# Patient Record
Sex: Male | Born: 1945 | Race: White | Hispanic: No | Marital: Married | Smoking: Former smoker
Health system: Southern US, Community
[De-identification: ages and names within clinical notes are randomized; demographics above are authoritative.]

## PROBLEM LIST (undated history)

## (undated) DIAGNOSIS — Z8719 Personal history of other diseases of the digestive system: Secondary | ICD-10-CM

## (undated) DIAGNOSIS — R233 Spontaneous ecchymoses: Secondary | ICD-10-CM

## (undated) DIAGNOSIS — Z8711 Personal history of peptic ulcer disease: Secondary | ICD-10-CM

## (undated) DIAGNOSIS — R112 Nausea with vomiting, unspecified: Secondary | ICD-10-CM

## (undated) DIAGNOSIS — R51 Headache: Secondary | ICD-10-CM

## (undated) DIAGNOSIS — I219 Acute myocardial infarction, unspecified: Secondary | ICD-10-CM

## (undated) DIAGNOSIS — R1310 Dysphagia, unspecified: Secondary | ICD-10-CM

## (undated) DIAGNOSIS — T8859XA Other complications of anesthesia, initial encounter: Secondary | ICD-10-CM

## (undated) DIAGNOSIS — R42 Dizziness and giddiness: Secondary | ICD-10-CM

## (undated) DIAGNOSIS — R238 Other skin changes: Secondary | ICD-10-CM

## (undated) DIAGNOSIS — T4145XA Adverse effect of unspecified anesthetic, initial encounter: Secondary | ICD-10-CM

## (undated) DIAGNOSIS — E039 Hypothyroidism, unspecified: Secondary | ICD-10-CM

## (undated) DIAGNOSIS — H9193 Unspecified hearing loss, bilateral: Secondary | ICD-10-CM

## (undated) DIAGNOSIS — Z9889 Other specified postprocedural states: Secondary | ICD-10-CM

## (undated) DIAGNOSIS — K219 Gastro-esophageal reflux disease without esophagitis: Secondary | ICD-10-CM

## (undated) DIAGNOSIS — R519 Headache, unspecified: Secondary | ICD-10-CM

## (undated) DIAGNOSIS — I639 Cerebral infarction, unspecified: Secondary | ICD-10-CM

## (undated) DIAGNOSIS — E119 Type 2 diabetes mellitus without complications: Secondary | ICD-10-CM

## (undated) DIAGNOSIS — E785 Hyperlipidemia, unspecified: Secondary | ICD-10-CM

## (undated) DIAGNOSIS — I2510 Atherosclerotic heart disease of native coronary artery without angina pectoris: Secondary | ICD-10-CM

## (undated) DIAGNOSIS — M1990 Unspecified osteoarthritis, unspecified site: Secondary | ICD-10-CM

## (undated) HISTORY — DX: Hyperlipidemia, unspecified: E78.5

## (undated) HISTORY — DX: Spontaneous ecchymoses: R23.3

## (undated) HISTORY — DX: Gastro-esophageal reflux disease without esophagitis: K21.9

## (undated) HISTORY — DX: Dysphagia, unspecified: R13.10

## (undated) HISTORY — DX: Atherosclerotic heart disease of native coronary artery without angina pectoris: I25.10

## (undated) HISTORY — DX: Dizziness and giddiness: R42

## (undated) HISTORY — DX: Unspecified osteoarthritis, unspecified site: M19.90

## (undated) HISTORY — DX: Other skin changes: R23.8

## (undated) HISTORY — PX: CORONARY ANGIOPLASTY WITH STENT PLACEMENT: SHX49

---

## 1996-03-29 HISTORY — PX: LAPAROSCOPIC CHOLECYSTECTOMY: SUR755

## 1998-03-29 HISTORY — PX: CARDIAC CATHETERIZATION: SHX172

## 1998-09-23 ENCOUNTER — Encounter: Payer: Self-pay | Admitting: Emergency Medicine

## 1998-09-23 ENCOUNTER — Inpatient Hospital Stay (HOSPITAL_COMMUNITY): Admission: EM | Admit: 1998-09-23 | Discharge: 1998-09-25 | Payer: Self-pay | Admitting: *Deleted

## 2002-03-29 HISTORY — PX: ANTERIOR CERVICAL DECOMP/DISCECTOMY FUSION: SHX1161

## 2002-07-17 ENCOUNTER — Encounter: Payer: Self-pay | Admitting: Neurosurgery

## 2002-07-19 ENCOUNTER — Inpatient Hospital Stay (HOSPITAL_COMMUNITY): Admission: RE | Admit: 2002-07-19 | Discharge: 2002-07-20 | Payer: Self-pay | Admitting: Neurosurgery

## 2002-07-19 ENCOUNTER — Encounter: Payer: Self-pay | Admitting: Neurosurgery

## 2003-01-08 ENCOUNTER — Emergency Department (HOSPITAL_COMMUNITY): Admission: EM | Admit: 2003-01-08 | Discharge: 2003-01-08 | Payer: Self-pay | Admitting: Emergency Medicine

## 2003-01-08 ENCOUNTER — Encounter: Payer: Self-pay | Admitting: Emergency Medicine

## 2003-01-14 ENCOUNTER — Encounter: Admission: RE | Admit: 2003-01-14 | Discharge: 2003-04-14 | Payer: Self-pay | Admitting: Family Medicine

## 2003-10-08 ENCOUNTER — Encounter: Payer: Self-pay | Admitting: Internal Medicine

## 2004-05-18 ENCOUNTER — Ambulatory Visit: Payer: Self-pay | Admitting: Internal Medicine

## 2004-08-25 ENCOUNTER — Ambulatory Visit: Payer: Self-pay | Admitting: Internal Medicine

## 2004-09-01 ENCOUNTER — Ambulatory Visit: Payer: Self-pay | Admitting: Internal Medicine

## 2005-05-25 ENCOUNTER — Ambulatory Visit: Payer: Self-pay | Admitting: Internal Medicine

## 2005-06-07 ENCOUNTER — Ambulatory Visit: Payer: Self-pay | Admitting: Gastroenterology

## 2005-06-17 ENCOUNTER — Ambulatory Visit: Payer: Self-pay | Admitting: Gastroenterology

## 2005-06-17 ENCOUNTER — Encounter (INDEPENDENT_AMBULATORY_CARE_PROVIDER_SITE_OTHER): Payer: Self-pay | Admitting: Specialist

## 2005-06-17 ENCOUNTER — Encounter: Payer: Self-pay | Admitting: Internal Medicine

## 2005-11-24 ENCOUNTER — Ambulatory Visit: Payer: Self-pay | Admitting: Internal Medicine

## 2005-12-13 ENCOUNTER — Encounter: Payer: Self-pay | Admitting: Internal Medicine

## 2005-12-13 ENCOUNTER — Ambulatory Visit: Payer: Self-pay

## 2005-12-15 ENCOUNTER — Ambulatory Visit: Payer: Self-pay | Admitting: Internal Medicine

## 2006-03-16 ENCOUNTER — Ambulatory Visit: Payer: Self-pay | Admitting: Internal Medicine

## 2006-05-13 ENCOUNTER — Emergency Department (HOSPITAL_COMMUNITY): Admission: EM | Admit: 2006-05-13 | Discharge: 2006-05-13 | Payer: Self-pay | Admitting: Emergency Medicine

## 2006-05-19 ENCOUNTER — Ambulatory Visit: Payer: Self-pay | Admitting: Internal Medicine

## 2006-05-19 ENCOUNTER — Encounter: Payer: Self-pay | Admitting: Internal Medicine

## 2006-05-19 LAB — CONVERTED CEMR LAB: Hgb A1c MFr Bld: 8.2 % — ABNORMAL HIGH (ref 4.6–6.0)

## 2006-09-19 ENCOUNTER — Ambulatory Visit: Payer: Self-pay | Admitting: Internal Medicine

## 2006-09-26 DIAGNOSIS — E785 Hyperlipidemia, unspecified: Secondary | ICD-10-CM | POA: Insufficient documentation

## 2006-09-26 DIAGNOSIS — R42 Dizziness and giddiness: Secondary | ICD-10-CM | POA: Insufficient documentation

## 2006-09-26 DIAGNOSIS — G609 Hereditary and idiopathic neuropathy, unspecified: Secondary | ICD-10-CM | POA: Insufficient documentation

## 2006-09-26 DIAGNOSIS — K219 Gastro-esophageal reflux disease without esophagitis: Secondary | ICD-10-CM | POA: Insufficient documentation

## 2006-09-26 DIAGNOSIS — E119 Type 2 diabetes mellitus without complications: Secondary | ICD-10-CM | POA: Insufficient documentation

## 2006-09-26 DIAGNOSIS — I2510 Atherosclerotic heart disease of native coronary artery without angina pectoris: Secondary | ICD-10-CM | POA: Insufficient documentation

## 2006-11-29 ENCOUNTER — Telehealth: Payer: Self-pay | Admitting: Internal Medicine

## 2006-12-01 ENCOUNTER — Telehealth: Payer: Self-pay | Admitting: Internal Medicine

## 2006-12-08 ENCOUNTER — Telehealth: Payer: Self-pay | Admitting: Internal Medicine

## 2006-12-12 ENCOUNTER — Ambulatory Visit: Payer: Self-pay | Admitting: Internal Medicine

## 2006-12-12 LAB — CONVERTED CEMR LAB
BUN: 14 mg/dL (ref 6–23)
CO2: 26 meq/L (ref 19–32)
Calcium: 9 mg/dL (ref 8.4–10.5)
Chloride: 111 meq/L (ref 96–112)
Cholesterol: 192 mg/dL (ref 0–200)
Creatinine, Ser: 1 mg/dL (ref 0.4–1.5)
Creatinine,U: 246.2 mg/dL
GFR calc Af Amer: 98 mL/min
GFR calc non Af Amer: 81 mL/min
Glucose, Bld: 158 mg/dL — ABNORMAL HIGH (ref 70–99)
HDL: 23.2 mg/dL — ABNORMAL LOW (ref >39.0)
Hgb A1c MFr Bld: 7.3 % — ABNORMAL HIGH (ref 4.6–6.0)
LDL Cholesterol: 130 mg/dL — ABNORMAL HIGH (ref 0–99)
Microalb Creat Ratio: 40.2 mg/g — ABNORMAL HIGH (ref 0.0–30.0)
Microalb, Ur: 9.9 mg/dL — ABNORMAL HIGH (ref 0.0–1.9)
Potassium: 4.3 meq/L (ref 3.5–5.1)
Sodium: 141 meq/L (ref 135–145)
Total CHOL/HDL Ratio: 8.3
Triglycerides: 195 mg/dL — ABNORMAL HIGH (ref 0–149)
VLDL: 39 mg/dL (ref 0–40)

## 2006-12-26 ENCOUNTER — Telehealth: Payer: Self-pay | Admitting: Internal Medicine

## 2006-12-26 ENCOUNTER — Ambulatory Visit: Payer: Self-pay | Admitting: Internal Medicine

## 2007-01-10 ENCOUNTER — Ambulatory Visit: Payer: Self-pay | Admitting: Internal Medicine

## 2007-01-10 DIAGNOSIS — J069 Acute upper respiratory infection, unspecified: Secondary | ICD-10-CM | POA: Insufficient documentation

## 2007-01-11 ENCOUNTER — Telehealth: Payer: Self-pay | Admitting: Internal Medicine

## 2007-01-15 ENCOUNTER — Encounter: Payer: Self-pay | Admitting: Internal Medicine

## 2007-01-16 ENCOUNTER — Ambulatory Visit: Payer: Self-pay | Admitting: Internal Medicine

## 2007-05-02 ENCOUNTER — Ambulatory Visit: Payer: Self-pay | Admitting: Internal Medicine

## 2007-05-02 LAB — CONVERTED CEMR LAB
ALT: 29 units/L (ref 0–53)
AST: 21 units/L (ref 0–37)
Albumin: 4.1 g/dL (ref 3.5–5.2)
Alkaline Phosphatase: 113 units/L (ref 39–117)
BUN: 17 mg/dL (ref 6–23)
Bilirubin, Direct: 0.2 mg/dL (ref 0.0–0.3)
CO2: 28 meq/L (ref 19–32)
Calcium: 9.6 mg/dL (ref 8.4–10.5)
Chloride: 101 meq/L (ref 96–112)
Cholesterol: 199 mg/dL (ref 0–200)
Creatinine, Ser: 1 mg/dL (ref 0.4–1.5)
Creatinine,U: 199.9 mg/dL
Direct LDL: 108.7 mg/dL
GFR calc Af Amer: 98 mL/min
GFR calc non Af Amer: 81 mL/min
Glucose, Bld: 153 mg/dL — ABNORMAL HIGH (ref 70–99)
HDL: 25.7 mg/dL — ABNORMAL LOW (ref >39.0)
Hgb A1c MFr Bld: 7.4 % — ABNORMAL HIGH (ref 4.6–6.0)
Microalb Creat Ratio: 43.5 mg/g — ABNORMAL HIGH (ref 0.0–30.0)
Microalb, Ur: 8.7 mg/dL — ABNORMAL HIGH (ref 0.0–1.9)
Potassium: 4.1 meq/L (ref 3.5–5.1)
Sodium: 139 meq/L (ref 135–145)
Total Bilirubin: 1.5 mg/dL — ABNORMAL HIGH (ref 0.3–1.2)
Total CHOL/HDL Ratio: 7.7
Total Protein: 6.7 g/dL (ref 6.0–8.3)
Triglycerides: 201 mg/dL (ref 0–149)
VLDL: 40 mg/dL (ref 0–40)

## 2007-05-16 ENCOUNTER — Ambulatory Visit: Payer: Self-pay | Admitting: Internal Medicine

## 2007-07-10 ENCOUNTER — Ambulatory Visit: Payer: Self-pay | Admitting: Internal Medicine

## 2007-07-10 DIAGNOSIS — R109 Unspecified abdominal pain: Secondary | ICD-10-CM | POA: Insufficient documentation

## 2007-07-11 ENCOUNTER — Ambulatory Visit: Payer: Self-pay | Admitting: Internal Medicine

## 2007-07-11 LAB — CONVERTED CEMR LAB
ALT: 26 units/L (ref 0–53)
AST: 21 units/L (ref 0–37)
Albumin: 4.1 g/dL (ref 3.5–5.2)
Alkaline Phosphatase: 99 units/L (ref 39–117)
Amylase: 89 units/L (ref 27–131)
BUN: 13 mg/dL (ref 6–23)
Basophils Absolute: 0.1 10*3/uL (ref 0.0–0.1)
Basophils Relative: 0.5 % (ref 0.0–1.0)
Bilirubin, Direct: 0.1 mg/dL (ref 0.0–0.3)
CO2: 28 meq/L (ref 19–32)
Calcium: 9.6 mg/dL (ref 8.4–10.5)
Chloride: 102 meq/L (ref 96–112)
Creatinine, Ser: 1 mg/dL (ref 0.4–1.5)
Eosinophils Absolute: 0.3 10*3/uL (ref 0.0–0.7)
Eosinophils Relative: 2.5 % (ref 0.0–5.0)
GFR calc Af Amer: 98 mL/min
GFR calc non Af Amer: 81 mL/min
Glucose, Bld: 138 mg/dL — ABNORMAL HIGH (ref 70–99)
HCT: 43.3 % (ref 39.0–52.0)
Hemoglobin: 14.8 g/dL (ref 13.0–17.0)
Lymphocytes Relative: 18.5 % (ref 12.0–46.0)
MCHC: 34.1 g/dL (ref 30.0–36.0)
MCV: 92.2 fL (ref 78.0–100.0)
Monocytes Absolute: 0.9 10*3/uL (ref 0.1–1.0)
Monocytes Relative: 8.7 % (ref 3.0–12.0)
Neutro Abs: 7 10*3/uL (ref 1.4–7.7)
Neutrophils Relative %: 69.8 % (ref 43.0–77.0)
Platelets: 276 10*3/uL (ref 150–400)
Potassium: 4 meq/L (ref 3.5–5.1)
RBC: 4.7 M/uL (ref 4.22–5.81)
RDW: 11.9 % (ref 11.5–14.6)
Sodium: 139 meq/L (ref 135–145)
Total Bilirubin: 1 mg/dL (ref 0.3–1.2)
Total Protein: 7 g/dL (ref 6.0–8.3)
WBC: 10.2 10*3/uL (ref 4.5–10.5)

## 2007-07-25 ENCOUNTER — Ambulatory Visit: Payer: Self-pay | Admitting: Internal Medicine

## 2007-10-12 ENCOUNTER — Ambulatory Visit: Payer: Self-pay | Admitting: Internal Medicine

## 2007-10-12 DIAGNOSIS — M722 Plantar fascial fibromatosis: Secondary | ICD-10-CM | POA: Insufficient documentation

## 2007-10-12 LAB — CONVERTED CEMR LAB
ALT: 33 units/L (ref 0–53)
AST: 28 units/L (ref 0–37)
BUN: 15 mg/dL (ref 6–23)
CO2: 27 meq/L (ref 19–32)
Calcium: 9.4 mg/dL (ref 8.4–10.5)
Chloride: 102 meq/L (ref 96–112)
Cholesterol: 171 mg/dL (ref 0–200)
Creatinine, Ser: 1 mg/dL (ref 0.4–1.5)
Direct LDL: 106.6 mg/dL
GFR calc Af Amer: 98 mL/min
GFR calc non Af Amer: 81 mL/min
Glucose, Bld: 154 mg/dL — ABNORMAL HIGH (ref 70–99)
HDL: 22.4 mg/dL — ABNORMAL LOW (ref >39.0)
Hgb A1c MFr Bld: 7.1 % — ABNORMAL HIGH (ref 4.6–6.0)
Potassium: 4.5 meq/L (ref 3.5–5.1)
Sodium: 138 meq/L (ref 135–145)
Total CHOL/HDL Ratio: 7.6
Triglycerides: 203 mg/dL (ref 0–149)
VLDL: 41 mg/dL — ABNORMAL HIGH (ref 0–40)

## 2008-09-12 ENCOUNTER — Emergency Department (HOSPITAL_COMMUNITY): Admission: EM | Admit: 2008-09-12 | Discharge: 2008-09-12 | Payer: Self-pay | Admitting: Emergency Medicine

## 2010-04-28 NOTE — Progress Notes (Signed)
Summary: Refills  Phone Note Call from Patient   Caller: Patient Call For: Swords Summary of Call: Pt called to report he was just seen today by Dr Cato Mulligan and he did get his Diazapam and Metformin refilled today, however he also needs the Simvastin 80 mg, one daily and Viagra 100 mg PRN refilled [facility], phone # [phone] Initial call taken by: Sid Falcon LPN,  December 26, 2006 3:21 PM      Prescriptions: VIAGRA 100 MG TABS (SILDENAFIL CITRATE) as needed  #8 x 6   Entered by:   Lynann Beaver CMA   Authorized by:   Birdie Sons MD   Signed by:   Lynann Beaver CMA on 12/26/2006   Method used:   Electronically sent to ...       [facility] [phone] [facility]       [address]       [place], [place]  [address]       Ph: [phone]       Fax: [phone]   RxID:   [id number] SIMVASTATIN 80 MG TABS (SIMVASTATIN) Take 1 tablet by mouth at bedtime  #90 x 3   Entered by:   Lynann Beaver CMA   Authorized by:   Birdie Sons MD   Signed by:   Lynann Beaver CMA on 12/26/2006   Method used:   Electronically sent to ...       [facility] [phone] [facility] [place]       [address]       [place], [place]  [address]       Ph: [phone]       Fax: [phone]   RxID:   [id number]

## 2010-04-28 NOTE — Assessment & Plan Note (Signed)
Summary: AGE APPROPIATE PHYSICAL FOR INS/MHF   Vital Signs:  Patient Profile:   65 Years Old Male Height:     73 inches Weight:      249 pounds Pulse rate:   68 / minute BP sitting:   130 / 74  (left arm)  Vitals Entered By: Gladis Riffle, RN (July 25, 2007 2:21 PM)                 Chief Complaint:  cpx, labs done--has form--taking antihistamine for seasonal allergies, and wants astelin.  History of Present Illness: CPX    Current Allergies (reviewed today): ! CODEINE SULFATE (CODEINE SULFATE)  Past Medical History:    Reviewed history from 12/26/2006 and no changes required:       Coronary artery disease       Diabetes mellitus, type II       GERD       Hyperlipidemia       hearing loss       vertigo  (Valium)       diabetic neuropathy--see NCS       Hypertension  Past Surgical History:    Reviewed history from 09/26/2006 and no changes required:       cervical fusion       CAD stents X3 2000       Cholecystectomy 1959   Social History:    Reviewed history from 12/26/2006 and no changes required:       Occupation:       Married       Regular exercise-no    Review of Systems       no other complaints in a complete ROS      Impression & Recommendations:  Problem # 1:  PREVENTIVE HEALTH CARE (ICD-V70.0) tDap vaccine given: Left Deltoid, IM, 0.5cc dose.  manufacturer: Sheran Lawless   exp: 05/04/09  lot#:[id number]   Problem # 2:  ABDOMINAL PAIN (ICD-789.00) resolved ok to resume prevacid and zantac  Complete Medication List: 1)  Aspirin 325 Mg Tbec (Aspirin) .... Qd 2)  Co Q-10 100 Mg Caps (Coenzyme q10) .... Take 1 once a day 3)  Lisinopril 20 Mg Tabs (Lisinopril) .... Take 1 tablet by mouth once a day 4)  Meclizine Hcl 25 Mg Tabs (Meclizine hcl) .... Take 1 tablet by mouth at bedtime as needed 5)  Metformin Hcl 1000 Mg Tabs (Metformin hcl) .... Take 1 tablet by mouth two times a day 6)  Simvastatin 80 Mg Tabs (Simvastatin) .... Take 1 tablet  by mouth at bedtime 7)  Trazodone Hcl 100 Mg Tabs (Trazodone hcl) .... 1/2 hs as needed 8)  Diazepam 5 Mg Tabs (Diazepam) .Marland Kitchen.. 1 po qd as needed 9)  Viagra 100 Mg Tabs (Sildenafil citrate) .... As needed 10)  Prevacid 30 Mg Cpdr (Lansoprazole) .... One by mouth daily 11)  Glipizide 2.5 Mg Tb24 (Glipizide) .... Take 1 tablet by mouth once a day  Other Orders: Admin 1st Vaccine ([address]) Tdap => 4yrs IM ([address])   Patient Instructions: 1)  keep scheduled appt 2)  change labs to cpx labs with PSA and hgb A1C    ]Physical Exam General Appearance: well developed, well nourished, no acute distress Eyes: conjunctiva and lids normal, PERRL, EOMI, fundi WNL Ears, Nose, Mouth, Throat: TM clear, nares clear, oral exam WNL Neck: supple, no lymphadenopathy, no thyromegaly, no JVD Respiratory: clear to auscultation and percussion, respiratory effort normal Cardiovascular: regular rate and rhythm, S1-S2, no murmur, rub or gallop, no  bruits, peripheral pulses normal and symmetric, no cyanosis, clubbing, edema or varicosities Chest: no scars, masses, tenderness; no asymmetry, skin changes, nipple discharge, no gynecomastia   Gastrointestinal: soft, non-tender; no hepatosplenomegaly, masses; active bowel sounds all quadrants, ; no masses, tenderness, hemorrhoids  Genitourinary: no hernia, testicular mass, or prostate enlargement Lymphatic: no cervical, axillary or inguinal adenopathy Musculoskeletal: gait normal, muscle tone and strength WNL, no joint swelling, effusions, discoloration, crepitus  Skin: clear, good turgor, color WNL, no rashes, lesions, or ulcerations Neurologic: normal mental status, normal reflexes, normal strength, sensation, and motion Psychiatric: alert; oriented to person, place and time Other Exam:      Preventive Care Screening  Colonoscopy:    Next Due:  06/2010

## 2010-04-28 NOTE — Progress Notes (Signed)
Summary: NEEDS NOTE   Phone Note Call from Patient Call back at Home Phone [phone]   Caller: Patient Call For: SWORDS Summary of Call: IN YESTERDAY AND WAS GIVEN SOME MEDS TO GET RID OF COLD AND TO BE ABLE TO STAY OUT OF WORK A COUPLE OF DAYS HE NEEDS A NOTE FROM DOCTOR FOR WORK  Initial call taken by: Roselle Locus,  January 11, 2007 2:42 PM  Follow-up for Phone Call        he can pick up note Follow-up by: Birdie Sons MD,  January 15, 2007 10:03 AM  Additional Follow-up for Phone Call Additional follow up Details #1::        Left pt a message if he still needs out of work note, he can pick it up. Additional Follow-up by: Lynann Beaver CMA,  January 17, 2007 8:56 AM

## 2010-04-28 NOTE — Assessment & Plan Note (Signed)
Summary: pt no better/njr   Vital Signs:  Patient Profile:   65 Years Old Male Weight:      241 pounds Temp:     97.6 degrees F oral Pulse rhythm:   regular Resp:     16 per minute BP sitting:   110 / 82  Vitals Entered By: Lynann Beaver CMA (January 16, 2007 3:22 PM)                 Chief Complaint:  c/o continued cough and wheezing.  History of Present Illness: was feeling better last week. now cough and wheeze have resolved. no fever no SOb. He has felt a little chilled-but temperatures have been normal.   Current Allergies (reviewed today): ! CODEINE SULFATE (CODEINE SULFATE)  Past Medical History:    Reviewed history from 12/26/2006 and no changes required:       Coronary artery disease       Diabetes mellitus, type II       GERD       Hyperlipidemia       hearing loss       vertigo  (Valium)       diabetic neuropathy--see NCS       Hypertension  Past Surgical History:    Reviewed history from 09/26/2006 and no changes required:       cervical fusion       CAD stents X3 2000       Cholecystectomy 1959   Social History:    Reviewed history from 12/26/2006 and no changes required:       Occupation:       Married       Regular exercise-no    Review of Systems       no other complaints in a complete ROS    Physical Exam  General:     Well-developed,well-nourished,in no acute distress; alert,appropriate and cooperative throughout examination Head:     normocephalic and atraumatic.   Neck:     No deformities, masses, or tenderness noted. Lungs:     bilateral wheeze and rhonchi    Impression & Recommendations:  Problem # 1:  URI (ICD-465.9) zpack  albuterol. The following medications were removed from the medication list:    Cold Caplets 30-2-15-500 Mg Tabs (Pseudoeph-cpm-dm-apap) .Marland Kitchen..Marland Kitchen Two times a day  His updated medication list for this problem includes:    Aspirin 325 Mg Tbec (Aspirin) ..... Qd   Complete Medication List: 1)   Aspirin 325 Mg Tbec (Aspirin) .... Qd 2)  Co Q-10 100 Mg Caps (Coenzyme q10) .... Take 1 once a day 3)  Lisinopril 20 Mg Tabs (Lisinopril) .... Take 1 tablet by mouth once a day 4)  Meclizine Hcl 25 Mg Tabs (Meclizine hcl) .... Take 1 tablet by mouth at bedtime as needed 5)  Metformin Hcl 1000 Mg Tabs (Metformin hcl) .... Take 1 tablet by mouth two times a day 6)  Simvastatin 80 Mg Tabs (Simvastatin) .... Take 1 tablet by mouth at bedtime 7)  Trazodone Hcl 100 Mg Tabs (Trazodone hcl) .... 1/2 hs as needed 8)  Diazepam 5 Mg Tabs (Diazepam) .Marland Kitchen.. 1 po qd as needed 9)  Viagra 100 Mg Tabs (Sildenafil citrate) .... As needed 10)  Zantac 150 Mg Tabs (Ranitidine hcl) .... Take 1 tablet by mouth once a day 11)  Zithromax 250 Mg Tabs (Azithromycin) .... 2 by mouth today then 1 by mouth once daily for 4 days     Prescriptions: ZITHROMAX 250 MG  TABS (AZITHROMYCIN) 2 by mouth today then 1 by mouth once daily for 4 days  #6 x 0   Entered and Authorized by:   Birdie Sons MD   Signed by:   Birdie Sons MD on 01/16/2007   Method used:   Electronically sent to ...       [facility] [phone] [facility]       [address]       [place], [place]  [address]       Ph: [phone]       Fax: [phone]   RxID:   [id number]  ]

## 2010-04-28 NOTE — Consult Note (Signed)
Summary: Dr Neale Burly note  Dr Neale Burly note   Imported By: Kassie Mends 08/07/2007 16:24:29  _____________________________________________________________________  External Attachment:    Type:   Image     Comment:   Dr Neale Burly note

## 2010-04-28 NOTE — Progress Notes (Signed)
  Medications Added ASPIR-81 81 MG TBEC (ASPIRIN) Take 1 tablet by mouth once a day CO Q-10 100 MG CAPS (COENZYME Q10) Take 1 once a day LISINOPRIL 20 MG TABS (LISINOPRIL)  MECLIZINE HCL 25 MG TABS (MECLIZINE HCL) Take 1 tablet by mouth at bedtime METFORMIN HCL 500 MG TABS (METFORMIN HCL)  SIMVASTATIN 80 MG TABS (SIMVASTATIN) Take 1 tablet by mouth at bedtime TRAZODONE HCL 50 MG TABS (TRAZODONE HCL)  VALIUM 5 MG TABS (DIAZEPAM) 1 tablet by mouth single dose VIAGRA 100 MG TABS (SILDENAFIL CITRATE)  ZANTAC 150 MG TABS (RANITIDINE HCL) Take 1 tablet by mouth once a day       Phone Note Call from Patient Call back at Home Phone [phone] Call back at [phone]   Caller: Patient Call For: SWORDS Summary of Call: WILL BE OUT OF METFORMIN THIS WEEK  WANTS TO KNOW IF HE NEEDS BLOODWORK BEFORE HE GETS A REFILL Initial call taken by: Barnie Mort,  November 29, 2006 11:43 AM    New/Updated Medications: ASPIR-81 81 MG TBEC (ASPIRIN) Take 1 tablet by mouth once a day CO Q-10 100 MG CAPS (COENZYME Q10) Take 1 once a day LISINOPRIL 20 MG TABS (LISINOPRIL)  MECLIZINE HCL 25 MG TABS (MECLIZINE HCL) Take 1 tablet by mouth at bedtime METFORMIN HCL 500 MG TABS (METFORMIN HCL)  SIMVASTATIN 80 MG TABS (SIMVASTATIN) Take 1 tablet by mouth at bedtime TRAZODONE HCL 50 MG TABS (TRAZODONE HCL)  VALIUM 5 MG TABS (DIAZEPAM) 1 tablet by mouth single dose VIAGRA 100 MG TABS (SILDENAFIL CITRATE)  ZANTAC 150 MG TABS (RANITIDINE HCL) Take 1 tablet by mouth once a day

## 2010-04-28 NOTE — Assessment & Plan Note (Signed)
Summary: 4 month rov/pt will come in fasting/njr   Vital Signs:  Patient Profile:   65 Years Old Male Height:     73 inches Weight:      247 pounds Temp:     98.5 degrees F Pulse rate:   64 / minute BP sitting:   118 / 84  (left arm)  Vitals Entered By: Gladis Riffle, RN (October 12, 2007 8:40 AM)               Vision Comments: 07/2008   Chief Complaint:  4 month rov and fasting--c/o left heel pain that is affecting gait--states hx plantar faciitis.  History of Present Illness:  Follow-Up Visit      This is a 65 year old man who presents for Follow-up visit.  The patient denies chest pain, palpitations, dizziness, syncope, low blood sugar symptoms, high blood sugar symptoms, edema, SOB, DOE, PND, and orthopnea.  Since the last visit the patient notes no new problems or concerns.  The patient reports taking meds as prescribed, not monitoring BP, not monitoring blood sugars, and dietary noncompliance.  When questioned about possible medication side effects, the patient notes none.    continues to have trouble with plantar fasciitis left foot---has tried inserts, ice, etc....  Past Medical History: Coronary artery disease Diabetes mellitus, type II GERD Hyperlipidemia hearing loss vertigo  (Valium) diabetic neuropathy--see NCS Hypertension  Past Surgical History: cervical fusion CAD stents X3 2000 Cholecystectomy 1959  Social History: Occupation: Married Regular exercise-no  Family History:    Diabetes Management History:      He has not been enrolled in the "Diabetic Education Program".  He states understanding of dietary principles and is following his diet appropriately.  Sensory loss is noted.  Self foot exams are being performed.  He says that he is not exercising regularly.       Prior Medications Reviewed Using: Patient Recall  Updated Prior Medication List: ASPIRIN 325 MG  TBEC (ASPIRIN) qd CO Q-10 100 MG CAPS (COENZYME Q10) Take 1 once a day LISINOPRIL 20  MG TABS (LISINOPRIL) Take 1 tablet by mouth once a day MECLIZINE HCL 25 MG TABS (MECLIZINE HCL) Take 1 tablet by mouth at bedtime as needed METFORMIN HCL 1000 MG TABS (METFORMIN HCL) Take 1 tablet by mouth two times a day SIMVASTATIN 80 MG TABS (SIMVASTATIN) Take 1 tablet by mouth at bedtime TRAZODONE HCL 100 MG  TABS (TRAZODONE HCL) 1/2 hs as needed DIAZEPAM 5 MG  TABS (DIAZEPAM) 1 po qd as needed VIAGRA 100 MG TABS (SILDENAFIL CITRATE) as needed ZEGERID 20-1100 MG  CAPS (OMEPRAZOLE-SODIUM BICARBONATE) as needed--samples GLIPIZIDE 2.5 MG TB24 (GLIPIZIDE) Take 1 tablet by mouth once a day  Current Allergies (reviewed today): ! CODEINE SULFATE (CODEINE SULFATE)     Review of Systems       no other complaints in a complete ROS    Physical Exam  General:     Well-developed,well-nourished,in no acute distress; alert,appropriate and cooperative throughout examination Head:     Normocephalic and atraumatic without obvious abnormalities. No apparent alopecia or balding. Eyes:     pupils equal and pupils round.   Ears:     External ear exam shows no significant lesions or deformities.  Otoscopic examination reveals clear canals, tympanic membranes are intact bilaterally without bulging, retraction, inflammation or discharge. Hearing is grossly normal bilaterally. Nose:     External nasal examination shows no deformity or inflammation. Nasal mucosa are pink and moist without lesions or exudates.  Neck:     No deformities, masses, or tenderness noted. Chest Wall:     No deformities, masses, tenderness or gynecomastia noted. Lungs:     Normal respiratory effort, chest expands symmetrically. Lungs are clear to auscultation, no crackles or wheezes. Heart:     Normal rate and regular rhythm. S1 and S2 normal without gallop, murmur, click, rub or other extra sounds. Abdomen:     Bowel sounds positive,abdomen soft and non-tender without masses, organomegaly or hernias  noted.  overweight Msk:     No deformity or scoliosis noted of thoracic or lumbar spine.   Pulses:     R radial normal and L radial normal.   Extremities:     trace left pedal edema and trace right pedal edema.   Neurologic:     cranial nerves II-XII intact and gait normal.   Skin:     turgor normal and color normal.   Cervical Nodes:     no anterior cervical adenopathy and no posterior cervical adenopathy.   Psych:     memory intact for recent and remote and good eye contact.    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 Take 1 tablet by mouth once a day  BP today: 118/84 Prior BP: 130/74 (07/25/2007)  Labs Reviewed: Creat: 1.0 (07/10/2007) Chol: 199 (05/02/2007)   HDL: 25.7 (05/02/2007)   LDL: DEL (05/02/2007)   TG: 201 (05/02/2007)   Problem # 3:  HYPERLIPIDEMIA (ICD-272.4)  His updated medication list for this problem includes:    Simvastatin 80 Mg Tabs (Simvastatin) .Marland Kitchen... Take 1 tablet by mouth at bedtime  Labs Reviewed: Chol: 199 (05/02/2007)   HDL: 25.7 (05/02/2007)   LDL: DEL (05/02/2007)   TG: 201 (05/02/2007) SGOT: 21 (07/10/2007)   SGPT: 26 (07/10/2007)    Problem # 4:  DIABETES MELLITUS, TYPE II (ICD-250.00)  His updated medication list for this problem includes:    Aspirin 325 Mg Tbec (Aspirin) ..... Qd    Lisinopril 20 Mg Tabs (Lisinopril) .Marland Kitchen... Take 1 tablet by mouth once a day    Metformin Hcl 1000 Mg Tabs (Metformin hcl) .Marland Kitchen... Take 1 tablet by mouth two times a day     Glipizide 2.5 Mg Tb24 (Glipizide) .Marland Kitchen... 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 Qd 2)  Co Q-10 100 Mg Caps (Coenzyme q10) .... Take 1 once a day 3)  Lisinopril 20 Mg Tabs (Lisinopril) .... Take 1 tablet by mouth once a day 4)  Meclizine Hcl 25 Mg Tabs (Meclizine hcl) .... Take 1 tablet by mouth at bedtime as needed 5)  Metformin Hcl 1000 Mg Tabs (Metformin hcl) .... Take 1 tablet by mouth  two times a day 6)  Simvastatin 80 Mg Tabs (Simvastatin) .... Take 1 tablet by mouth at bedtime 7)  Trazodone Hcl 100 Mg Tabs (Trazodone hcl) .... 1/2 hs as needed 8)  Diazepam 5 Mg Tabs (Diazepam) .Marland Kitchen.. 1 po qd as needed 9)  Viagra 100 Mg Tabs (Sildenafil citrate) .... As needed 10)  Zegerid 20-1100 Mg Caps (Omeprazole-sodium bicarbonate) .... As needed--samples 11)  Glipizide 2.5 Mg Tb24 (Glipizide) .... Take 1 tablet by mouth once a day  Diabetes Management Assessment/Plan:      The following lipid goals have been established for the patient: Total cholesterol goal of 200; LDL cholesterol goal of 100; HDL cholesterol goal of 40; Triglyceride goal of 200.     Patient Instructions: 1)  Please schedule a follow-up appointment in 4 months.   ]

## 2010-04-28 NOTE — Procedures (Signed)
Summary: colonoscopy  colonoscopy   Imported By: Kassie Mends 08/11/2007 08:07:34  _____________________________________________________________________  External Attachment:    Type:   Image     Comment:   colonoscopy

## 2010-04-28 NOTE — Assessment & Plan Note (Signed)
Summary: ROA/NTA   Vital Signs:  Patient Profile:   65 Years Old Male Weight:      247 pounds Temp:     98.5 degrees F Pulse rate:   42 / minute Pulse rhythm:   irregular BP sitting:   128 / 66  Vitals Entered By: Gladis Riffle, RN (May 16, 2007 10:24 AM)                 Chief Complaint:  rov and labs done--states still has bouts of vertigo--needs refill viagra.  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 tolerating meds.  CORONARY ARTERY DISEASE (ICD-414.00)-no cp SOB, PND, orthopnea (stent placed 2000)  Past Medical History: Coronary artery disease Diabetes mellitus, type II GERD Hyperlipidemia hearing loss vertigo  (Valium) diabetic neuropathy--see NCS Hypertension  Current Meds:  ASPIRIN 325 MG  TBEC (ASPIRIN) qd CO Q-10 100 MG CAPS (COENZYME Q10) Take 1 once a day LISINOPRIL 20 MG TABS (LISINOPRIL) Take 1 tablet by mouth once a day MECLIZINE HCL 25 MG TABS (MECLIZINE HCL) Take 1 tablet by mouth at bedtime as needed METFORMIN HCL 1000 MG TABS (METFORMIN HCL) Take 1 tablet by mouth two times a day SIMVASTATIN 80 MG TABS (SIMVASTATIN) Take 1 tablet by mouth at bedtime TRAZODONE HCL 100 MG  TABS (TRAZODONE HCL) 1/2 hs as needed DIAZEPAM 5 MG  TABS (DIAZEPAM) 1 po qd as needed VIAGRA 100 MG TABS (SILDENAFIL CITRATE) as needed ZANTAC 150 MG TABS (RANITIDINE HCL) Take 1 tablet by mouth once a day  Social History: Occupation: Married Regular exercise-no       Current Allergies (reviewed today): ! CODEINE SULFATE (CODEINE SULFATE)     Review of Systems       no other complaints in a complete ROS  sleeping well with trazodone   Physical Exam  General:     Well-developed,well-nourished,in no acute distress;  alert,appropriate and cooperative throughout examination Head:     normocephalic and atraumatic.   Eyes:     pupils equal and pupils round.   Ears:     R ear normal and L ear normal.   Nose:     no external deformity and no external erythema.   Neck:     No deformities, masses, or tenderness noted. Chest Wall:     No deformities, masses, tenderness or gynecomastia noted. Lungs:     Normal respiratory effort, chest expands symmetrically. Lungs are clear to auscultation, no crackles or wheezes. Heart:     Normal rate and regular rhythm. S1 and S2 normal without gallop, murmur, click, rub or other extra sounds. Abdomen:     Bowel sounds positive,abdomen soft and non-tender without masses, organomegaly or hernias noted. Msk:     No deformity or scoliosis noted of thoracic or lumbar spine.   Pulses:     R radial normal and L radial normal.   Extremities:     No clubbing, cyanosis, edema, or deformity noted  Neurologic:     cranial nerves II-XII intact and gait normal.   Skin:     Intact without suspicious lesions or rashes Cervical Nodes:     no anterior cervical adenopathy and no posterior cervical adenopathy.   Psych:     normally interactive, good eye contact, and not anxious appearing.      Impression & Recommendations:  Problem # 1:  HYPERTENSION (  ICD-401.9) adequate control.  Continue current medications. His updated medication list for this problem includes:    Lisinopril 20 Mg Tabs (Lisinopril) .Marland Kitchen... Take 1 tablet by mouth once a day  BP today: 128/66 Prior BP: 110/82 (01/16/2007)  Labs Reviewed: Creat: 1.0 (05/02/2007) Chol: 199 (05/02/2007)   HDL: 25.7 (05/02/2007)   LDL: DEL (05/02/2007)   TG: 201 (05/02/2007)   Problem # 2:  VERTIGO (ICD-780.4) symptoms are stable. His updated medication list for this problem includes:    Meclizine Hcl 25 Mg Tabs (Meclizine hcl) .Marland Kitchen... Take 1 tablet by mouth at bedtime as needed   Problem # 3:  HYPERLIPIDEMIA  (ICD-272.4) I would like his up is to be better controlled.  I have given him a prescription for Lipitor 80 mg daily Crestor 20 mg.  He will see which one of these medications is more favorably positioned in regards to his insurance plan.  He will call me with the results. His updated medication list for this problem includes:    Simvastatin 80 Mg Tabs (Simvastatin) .Marland Kitchen... Take 1 tablet by mouth at bedtime  Labs Reviewed: Chol: 199 (05/02/2007)   HDL: 25.7 (05/02/2007)   LDL: DEL (05/02/2007)   TG: 201 (05/02/2007) SGOT: 21 (05/02/2007)   SGPT: 29 (05/02/2007)   Problem # 4:  DIABETES MELLITUS, TYPE II (ICD-250.00) A1c is not adequately controlled.  Adjust medications as below.  I His updated medication list for this problem includes:    Aspirin 325 Mg Tbec (Aspirin) ..... Qd    Lisinopril 20 Mg Tabs (Lisinopril) .Marland Kitchen... Take 1 tablet by mouth once a day    Metformin Hcl 1000 Mg Tabs (Metformin hcl) .Marland Kitchen... Take 1 tablet by mouth two times a day    Glipizide 2.5 Mg Tb24 (Glipizide) .Marland Kitchen... Take 1 tablet by mouth once a day  Labs Reviewed: HgBA1c: 7.4 (05/02/2007)   Creat: 1.0 (05/02/2007)      Problem # 5:  CORONARY ARTERY DISEASE (ICD-414.00) asymptomatic. adjust lipid management. His updated medication list for this problem includes:    Aspirin 325 Mg Tbec (Aspirin) ..... Qd    Lisinopril 20 Mg Tabs (Lisinopril) .Marland Kitchen... Take 1 tablet by mouth once a day  Labs Reviewed: Chol: 199 (05/02/2007)   HDL: 25.7 (05/02/2007)   LDL: DEL (05/02/2007)   TG: 201 (05/02/2007)   Complete Medication List: 1)  Aspirin 325 Mg Tbec (Aspirin) .... Qd 2)  Co Q-10 100 Mg Caps (Coenzyme q10) .... Take 1 once a day 3)  Lisinopril 20 Mg Tabs (Lisinopril) .... Take 1 tablet by mouth once a day 4)  Meclizine Hcl 25 Mg Tabs (Meclizine hcl) .... Take 1 tablet by mouth at bedtime as needed 5)  Metformin Hcl 1000 Mg Tabs (Metformin hcl) .... Take 1 tablet by mouth two times a day 6)  Simvastatin 80 Mg Tabs  (Simvastatin) .... Take 1 tablet by mouth at bedtime 7)  Trazodone Hcl 100 Mg Tabs (Trazodone hcl) .... 1/2 hs as needed 8)  Diazepam 5 Mg Tabs (Diazepam) .Marland Kitchen.. 1 po qd as needed 9)  Viagra 100 Mg Tabs (Sildenafil citrate) .... As needed 10)  Zantac 150 Mg Tabs (Ranitidine hcl) .... Take 1 tablet by mouth once a day 11)  Glipizide 2.5 Mg Tb24 (Glipizide) .... Take 1 tablet by mouth once a day   Patient Instructions: 1)  Please schedule a follow-up appointment in 4 months.    Prescriptions: VIAGRA 100 MG TABS (SILDENAFIL CITRATE) as needed  #8 x 6   Entered and  Authorized by:   Birdie Sons MD   Signed by:   Birdie Sons MD on 05/16/2007   Method used:   Electronically sent to ...       [facility]. [phone]*       [address]       [place], [place]  [address]       Ph: [phone]       Fax: [phone]   RxID:   [id number] GLIPIZIDE 2.5 MG TB24 (GLIPIZIDE) Take 1 tablet by mouth once a day  #90 x 3   Entered and Authorized by:   Birdie Sons MD   Signed by:   Birdie Sons MD on 05/16/2007   Method used:   Electronically sent to ...       [facility] #[id number]*       [address]       [place], [place]  [address]       Ph: [phone]       Fax: [phone]   RxID:   [id number]  ]

## 2010-04-28 NOTE — Letter (Signed)
Summary: Out of Work  Adult nurse at [employer]  [address]   [place], [place] [address]   Phone: [phone]  Fax: [phone]    January 15, 2007   Employee:  Patrick Duran    To Whom It May Concern:   For Medical reasons, please excuse the above named employee from work for the following dates:  Start:   01/10/07  End:   01/13/07  If you need additional information, please feel free to contact our office.         Sincerely, ..................................................................Marland KitchenBirdie Sons MD  January 15, 2007 10:02 AM    Birdie Sons MD

## 2010-04-28 NOTE — Assessment & Plan Note (Signed)
Summary: cough/congestion/cold/njr   Vital Signs:  Patient Profile:   65 Years Old Male Weight:      245 pounds Temp:     98.1 degrees F oral Pulse rate:   76 / minute Pulse rhythm:   regular Resp:     16 per minute BP sitting:   130 / 82  Vitals Entered By: Lynann Beaver CMA (January 10, 2007 4:04 PM)                 Chief Complaint:  cough and chest congestion x 12 days.  Acute Visit History:      The patient complains of cough and sinus problems.  These symptoms began 2 weeks ago.  He denies fever.  Other comments include: 12 days of sxs. Still with runny nose.        The cough interferes with his sleep.  The character of the cough is described as nonproductive.  There is no history of wheezing, shortness of breath, respiratory retractions, tachypnea, cyanosis, or interference with oral intake associated with his cough.        He complains of sinus pressure and nasal congestion.  He denies a previous history of sinusitis.         Current Allergies (reviewed today): ! CODEINE SULFATE (CODEINE SULFATE)  Past Medical History:    Reviewed history from 12/26/2006 and no changes required:       Coronary artery disease       Diabetes mellitus, type II       GERD       Hyperlipidemia       hearing loss       vertigo  (Valium)       diabetic neuropathy--see NCS       Hypertension  Past Surgical History:    Reviewed history from 09/26/2006 and no changes required:       cervical fusion       CAD stents X3 2000       Cholecystectomy 1959   Social History:    Reviewed history from 12/26/2006 and no changes required:       Occupation:       Married       Regular exercise-no    Review of Systems       no other complaitns in a complete ros   Physical Exam  General:     Well-developed,well-nourished,in no acute distress; alert,appropriate and cooperative throughout examination Eyes:     pupils equal and pupils round.   Neck:     No deformities, masses, or  tenderness noted. Lungs:     Normal respiratory effort, chest expands symmetrically. Lungs are clear to auscultation, no crackles or wheezes. Heart:     Normal rate and regular rhythm. S1 and S2 normal without gallop, murmur, click, rub or other extra sounds.    Impression & Recommendations:  Problem # 1:  URI (ICD-465.9) no evidence of significant infxn call for any f/c/sob The following medications were removed from the medication list:    Mucinex Maximum Strength 1200 Mg Tb12 (Guaifenesin) .Marland Kitchen... As directed  His updated medication list for this problem includes:    Aspirin 325 Mg Tbec (Aspirin) ..... Qd    Cold Caplets 30-2-15-500 Mg Tabs (Pseudoeph-cpm-dm-apap) .Marland Kitchen..Marland Kitchen Two times a day   Complete Medication List: 1)  Aspirin 325 Mg Tbec (Aspirin) .... Qd 2)  Co Q-10 100 Mg Caps (Coenzyme q10) .... Take 1 once a day 3)  Lisinopril 20 Mg Tabs (Lisinopril) .Marland KitchenMarland KitchenMarland Kitchen  Take 1 tablet by mouth once a day 4)  Meclizine Hcl 25 Mg Tabs (Meclizine hcl) .... Take 1 tablet by mouth at bedtime as needed 5)  Metformin Hcl 1000 Mg Tabs (Metformin hcl) .... Take 1 tablet by mouth two times a day 6)  Simvastatin 80 Mg Tabs (Simvastatin) .... Take 1 tablet by mouth at bedtime 7)  Trazodone Hcl 100 Mg Tabs (Trazodone hcl) .... 1/2 hs as needed 8)  Diazepam 5 Mg Tabs (Diazepam) .Marland Kitchen.. 1 po qd as needed 9)  Viagra 100 Mg Tabs (Sildenafil citrate) .... As needed 10)  Zantac 150 Mg Tabs (Ranitidine hcl) .... Take 1 tablet by mouth once a day 11)  Cold Caplets 30-2-15-500 Mg Tabs (Pseudoeph-cpm-dm-apap) .... Two times a day     ]

## 2010-04-28 NOTE — Progress Notes (Signed)
  Phone Note Call from Patient Call back at [phone] CELL   Caller: Patient Call For: Liahna Brickner Summary of Call: PT SAID HE HAS CALLED TO BE SET UP FOR BLOODWORK HAS NOT HEARD ANYTHING. ALSO NEEDING A RF ON METFORMIN HCL 500 MG TABS PT WILL BE OUT OF MED BY 9/5 [facility]ARM[facility]Y [facility] CALL CELL Initial call taken by: Shan Levans,  December 01, 2006 11:59 AM      Prescriptions: METFORMIN HCL 500 MG TABS (METFORMIN HCL)   #60 x 5   Entered by:   Lynann Beaver CMA   Authorized by:   Birdie Sons MD   Signed by:   Lynann Beaver CMA on 12/01/2006   Method used:   Telephoned to ...         RxID:   [id number]     Appended Document:  schedule ov---lipids, bmet A1C, urine microalbumin, lfts one week prior to ov.  Appended Document: REFUSED APPTS    Phone Note Outgoing Call   Summary of Call: PT WAS INFORMED TO SCHEDULE LABS AND A FOLLOW. PT REFUSED LABS DUE TO SCHEDULING ISSUE. HE WANTED 8:00. UNFORTUNATELY WE COULD PROVIDE HIM WITH SUCH TIME. THEREFORE, HE RUFUSED BOTH LAB AND FUP WITH DR Marika Mahaffy. Initial call taken by: Warnell Forester,  December 02, 2006 8:45 AM          Appended Document:  schedule for 8AM next available

## 2010-04-28 NOTE — Assessment & Plan Note (Signed)
Summary: had fasting labs 9/15 rsc per doc/njr   Vital Signs:  Patient Profile:   65 Years Old Male Weight:      247 pounds Temp:     98 degrees F oral Pulse rate:   56 / minute Pulse rhythm:   irregular, skips BP sitting:   150 / 82  (left arm)                 Chief Complaint:  review labs, does not check cbg at home, and also recurrent dizzines including this AM.  History of Present Illness:  Follow-Up Visit: htn, lipids, dm has recurrent vertigo.       This is a 65 year old man who presents for Follow-up visit.  The patient denies chest pain, palpitations, dizziness, syncope, low blood sugar symptoms, high blood sugar symptoms, edema, SOB, DOE, PND, and orthopnea.  Since the last visit the patient notes no new problems or concerns.  The patient reports taking meds as prescribed.  When questioned about possible medication side effects, the patient notes none.    Current Allergies: ! CODEINE SULFATE (CODEINE SULFATE)  Past Medical History:    Reviewed history from 09/26/2006 and no changes required:       Coronary artery disease       Diabetes mellitus, type II       GERD       Hyperlipidemia       hearing loss       vertigo  (Valium)       diabetic neuropathy--see NCS       Hypertension  Past Surgical History:    Reviewed history from 09/26/2006 and no changes required:       cervical fusion       CAD stents X3 2000       Cholecystectomy 1959   Social History:    Occupation:    Married    Regular exercise-no   Risk Factors:  Exercise:  no  Colonoscopy History:     Date of Last Colonoscopy:  06/17/2005    Results:  polyps     Physical Exam  General:     Well-developed,well-nourished,in no acute distress; alert,appropriate and cooperative throughout examination Head:     Normocephalic and atraumatic without obvious abnormalities. No apparent alopecia or balding. Neck:     No deformities, masses, or tenderness noted. Chest Wall:     No  deformities, masses, tenderness or gynecomastia noted. Lungs:     Normal respiratory effort, chest expands symmetrically. Lungs are clear to auscultation, no crackles or wheezes. Heart:     Normal rate and regular rhythm. S1 and S2 normal without gallop, murmur, click, rub or other extra sounds. Abdomen:     Bowel sounds positive,abdomen soft and non-tender without masses, organomegaly or hernias noted. Msk:     No deformity or scoliosis noted of thoracic or lumbar spine.   Extremities:     No clubbing, cyanosis, edema, or deformity noted with normal full range of motion of all joints.   Neurologic:     No cranial nerve deficits noted. Station and gait are normal.  Sensory, motor and coordinative functions appear intact. Skin:     Intact without suspicious lesions or rashes Cervical Nodes:     No lymphadenopathy noted Axillary Nodes:     No palpable lymphadenopathy Psych:     Cognition and judgment appear intact. Alert and cooperative with normal attention span and concentration. No apparent delusions, illusions, hallucinations  Impression & Recommendations:  Problem # 1:  DIABETES MELLITUS, TYPE II (ICD-250.00)  His updated medication list for this problem includes:    Aspirin 325 Mg Tbec (Aspirin) ..... Qd    Lisinopril 20 Mg Tabs (Lisinopril) .Marland Kitchen... Take 1 tablet by mouth once a day    Metformin Hcl 1000 Mg Tabs (Metformin hcl) .Marland Kitchen... Take 1 tablet by mouth two times a day  Labs Reviewed: HgBA1c: 7.3 (12/12/2006)   Creat: 1.0 (12/12/2006)      Problem # 2:  CORONARY ARTERY DISEASE (ICD-414.00)  His updated medication list for this problem includes:    Aspirin 325 Mg Tbec (Aspirin) ..... Qd    Lisinopril 20 Mg Tabs (Lisinopril) .Marland Kitchen... Take 1 tablet by mouth once a day  Labs Reviewed: Chol: 192 (12/12/2006)   HDL: 23.2 (12/12/2006)   LDL: 130 (12/12/2006)   TG: 195 (12/12/2006)   Problem # 3:  HYPERLIPIDEMIA (ICD-272.4)  His updated medication list for this problem  includes:    Simvastatin 80 Mg Tabs (Simvastatin) .Marland Kitchen... Take 1 tablet by mouth at bedtime  Labs Reviewed: Chol: 192 (12/12/2006)   HDL: 23.2 (12/12/2006)   LDL: 130 (12/12/2006)   TG: 195 (12/12/2006)   Problem # 4:  VERTIGO (ICD-780.4) recurent symptoms-meclizine---"wipes me out" His updated medication list for this problem includes:    Meclizine Hcl 25 Mg Tabs (Meclizine hcl) .Marland Kitchen... Take 1 tablet by mouth at bedtime as needed   Problem # 5:  HYPERTENSION (ICD-401.9) monitor at home and we will follow up next visit.  His updated medication list for this problem includes:    Lisinopril 20 Mg Tabs (Lisinopril) .Marland Kitchen... Take 1 tablet by mouth once a day  BP today: 150/82  Labs Reviewed: Creat: 1.0 (12/12/2006) Chol: 192 (12/12/2006)   HDL: 23.2 (12/12/2006)   LDL: 130 (12/12/2006)   TG: 195 (12/12/2006)   Complete Medication List: 1)  Aspirin 325 Mg Tbec (Aspirin) .... Qd 2)  Co Q-10 100 Mg Caps (Coenzyme q10) .... Take 1 once a day 3)  Lisinopril 20 Mg Tabs (Lisinopril) .... Take 1 tablet by mouth once a day 4)  Meclizine Hcl 25 Mg Tabs (Meclizine hcl) .... Take 1 tablet by mouth at bedtime as needed 5)  Metformin Hcl 1000 Mg Tabs (Metformin hcl) .... Take 1 tablet by mouth two times a day 6)  Simvastatin 80 Mg Tabs (Simvastatin) .... Take 1 tablet by mouth at bedtime 7)  Trazodone Hcl 100 Mg Tabs (Trazodone hcl) .... 1/2 hs as needed 8)  Diazepam 5 Mg Tabs (Diazepam) .Marland Kitchen.. 1 po qd as needed 9)  Viagra 100 Mg Tabs (Sildenafil citrate) .... As needed 10)  Zantac 150 Mg Tabs (Ranitidine hcl) .... Take 1 tablet by mouth once a day   Patient Instructions: 1)  Please schedule a follow-up appointment in 4 months. 2)  BMP prior to visit, ICD-9: 3)  Hepatic Panel prior to visit, ICD-9: 4)  Lipid Panel prior to visit, ICD-9: 5)  HbgA1C prior to visit, ICD-9: 6)  Urine Microalbumin prior to visit, ICD-9:    Prescriptions: DIAZEPAM 5 MG  TABS (DIAZEPAM) 1 po qd as needed  #20 x 3    Entered and Authorized by:   Birdie Sons MD   Signed by:   Birdie Sons MD on 12/26/2006   Method used:   Print then Give to Patient   RxID:   [id number] METFORMIN HCL 1000 MG TABS (METFORMIN HCL) Take 1 tablet by mouth two times a day  #200 x  3   Entered and Authorized by:   Birdie Sons MD   Signed by:   Birdie Sons MD on 12/26/2006   Method used:   Electronically sent to ...       [facility] [phone] [facility]       [address]       [place], [place]  [address]       Ph: [phone]       Fax: [phone]   RxID:   [id number]  ]  Nuclear ETT  Procedure date:  12/13/2005  Findings:      normal with normal EF   Nuclear ETT  Procedure date:  12/13/2005  Findings:      normal with normal EF

## 2010-04-28 NOTE — Progress Notes (Signed)
  Phone Note Call from Patient   Caller: Patient Reason for Call: Refill Medication, Talk to Nurse Summary of Call: Patient sent email to [facility] office to complain because he could not get an early morning lab appointment.  I called patient scheduled him for labs on 12/12/06 at 8:15.  Dr Lovell Sheehan helped me find labs needed in phone note dated 9/4.  I will call patient back and tell him he also needs ov. Initial call taken by: Felipa Evener,  December 08, 2006 6:18 PM

## 2010-07-06 LAB — BASIC METABOLIC PANEL
BUN: 14 mg/dL (ref 6–23)
CO2: 25 mEq/L (ref 19–32)
Calcium: 9.2 mg/dL (ref 8.4–10.5)
Chloride: 102 mEq/L (ref 96–112)
Creatinine, Ser: 1.07 mg/dL (ref 0.4–1.5)
GFR calc Af Amer: >60 mL/min (ref >60)
GFR calc non Af Amer: >60 mL/min (ref >60)
Glucose, Bld: 190 mg/dL — ABNORMAL HIGH (ref 70–99)
Potassium: 5.1 mEq/L (ref 3.5–5.1)
Sodium: 136 mEq/L (ref 135–145)

## 2010-07-06 LAB — GLUCOSE, CAPILLARY: Glucose-Capillary: 197 mg/dL — ABNORMAL HIGH (ref 70–99)

## 2010-08-14 NOTE — Op Note (Signed)
NAME:  Patrick Duran, Patrick Duran                        ACCOUNT NO.:  [id number]   MEDICAL RECORD NO.:  [id number]                   PATIENT TYPE:  INP   LOCATION:  3172                                 FACILITY:  [facility]   PHYSICIAN:  Clydene Fake, M.D.               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   OPERATION PERFORMED:  Anterior cervical decompression, diskectomy and  fusion, C5-6 and C6-7 with olecranon allograft and tethered anterior  cervical plate.   SURGEON:  Clydene Fake, M.D.   ASSISTANT:  Stefani Dama, M.D.   ANESTHESIA:  General endotracheal.   ESTIMATED BLOOD LOSS:  Minimal.   BLOOD REPLACED:  None.   DRAINS:  None.   COMPLICATIONS:  None.   INDICATIONS FOR PROCEDURE:  The patient is a 65 year old gentleman who has  had neck pain, left side tingling, numbness, left arm pain.  He has had this  for a while but after January, things became worse.  He cannot tandem gait,  slightly slow with rapid alternating movements left worse than right, motor  strength and sensation intact.  MRI done showing spondylosis, 5-6 and 6-7  with disk bulge occurring, causing central and left sided cord compression  at 5-6 and significant foraminal narrowing and bilateral changes at 6-7 and  bilateral canal stenosis.  The patient was brought in for the cervical  diskectomy and fusion with cervical allograft and plating.   DESCRIPTION OF PROCEDURE:  The patient was brought to the operating room and  general anesthesia induced, and the patient was placed in 10 pounds halter  traction and prepped and draped in the usual sterile fashion.  Site of  incision injected with 10mL of 1% lidocaine with  epinephrine.  Incision was  then made from the midline to the anterior border of the sternocleidomastoid  muscle on the left side.  The neck incision was taken down to platysma and  hemostasis obtained with Bovie cauterization.  Platysma was incised with  Bovie.  Blunt dissection taken through the anterior cervical fascia to the  anterior cervical spine.  Large osteophyte was seen.  Needle was placed  there.  X-rays were obtained showing the needle was just below the C5-6  interspace.  Longus colli muscle was reflected laterally and revealing C5  through C7.  Self-retaining retractor was placed and the osteophytes were  removed and fresh diskectomy was performed at C5-6 and marker placed there  again, another x-ray confirming our levels.  Distraction pins were placed in  C5 and C7 and the two interspaces were distracted.  Diskectomies were then  performed using curets, pituitary rongeurs, 1 and 2 mm  Kerrison punches and  a high speed drill starting at C5-6 removing cartilaginous end plates,  removed the posterior osteophytes  and did bilateral foraminotomies.  There  was significant pressure on the left side central canal at the C5-6 level  and this was all decompressed.  When we were finished we had a good  decompression of the central canal and bilateral foramina and nerve roots.  The disk space was measured with the left _____ trial size to be 8 mm.  Interspace was reamed with the 8 mm reamer and Gelfoam was placed as the  allograft bone was developed for that level.  Attention was then taken to C6-  7.  Again disk was removed with pituitary rongeurs, curets.  High speed  drill was used to remove the cartilaginous end plates and osteophytes.  1 to  2 mm Kerrison punches were used to complete removing posterior osteophytes,  ligament and decompress the central and bilaterally foramen. When we were  finished we had good central decompression along with good decompression  over the nerve  roots bilaterally.  Hemostasis obtained with Gelfoam and  thrombin and this was irrigated out.  Measured the depth.  Our attention  went back to C5-6.  We tapped an 8 mm bone graft into place and measured the  intervertebral disk space at C6-7 using the interbody sizers and then found  it to be 7 mm.  We reamed it with a 7 mm reamer with the ________ set and  placed a 7 mm olecranon bone brought into the C6-7 space.  There was  plenty  of room between the posterior end of the bone and the dura at both levels.  Wound was irrigated with antibiotic solution.  Distraction pins were  removed, weight from distraction was also removed.  The bones were in nice,  tight interspaces. The entire anterior cervical plate was then placed over  the C5 to C7 intervertebral bodies.  We did need to do trim some more  osteophytes.  We did that and replaced the plate and placed two screws at  C7, one in C6 and two in C5, tightening them down when they were finished.  Lateral x-ray was obtained showing good position of plate, screws and  interbody bone plugs and good alignment of the spine.  Wound was irrigated  with antibiotic solution.  Retractors removed.  Hemostasis obtained with  bipolar cauterization and Gelfoam and thrombin. Gelfoam was irrigated out  and platysma was closed with 3-0 Vicryl interrupted suture and the  subcutaneous tissue was closed with  the same and the skin closed with  Benzoin and Steri-Strips.  Dressing was placed.  A soft ________ was placed.  Patient was awakened from anesthesia and transferred to recovery room in  stable condition.                                               Clydene Fake, M.D.    JRH/MEDQ  D:  07/19/2002  T:  07/19/2002  Job:  [id number]

## 2010-12-23 ENCOUNTER — Encounter (INDEPENDENT_AMBULATORY_CARE_PROVIDER_SITE_OTHER): Payer: Self-pay | Admitting: Surgery

## 2010-12-23 ENCOUNTER — Ambulatory Visit (INDEPENDENT_AMBULATORY_CARE_PROVIDER_SITE_OTHER): Payer: 59 | Admitting: Surgery

## 2010-12-23 VITALS — BP 168/88 | HR 80 | Temp 97.2°F | Resp 20 | Ht 73.0 in | Wt 250.1 lb

## 2010-12-23 DIAGNOSIS — L723 Sebaceous cyst: Secondary | ICD-10-CM

## 2010-12-23 DIAGNOSIS — L720 Epidermal cyst: Secondary | ICD-10-CM

## 2010-12-23 MED ORDER — TRAMADOL HCL 50 MG PO TABS: 50.0000 mg | ORAL_TABLET | Freq: Four times a day (QID) | ORAL | Status: DC | PRN

## 2010-12-23 MED ORDER — DOXYCYCLINE MONOHYDRATE 100 MG PO CAPS: 100.0000 mg | ORAL_CAPSULE | Freq: Two times a day (BID) | ORAL | Status: DC

## 2010-12-23 NOTE — Patient Instructions (Signed)
Remove packing in 48 hours and shower.  Cover with clean band aid daily. RTC 2 WEEKS.  Take antibiotics until gone.

## 2010-12-23 NOTE — Progress Notes (Signed)
Chief Complaint  Patient presents with  . Other    right groin seb cyst     HPI Patrick Duran is Duran 65 y.o. male.   HPI I week history of Duran red,  inflamed draning cyst in Right groin.  Painful and red especially last few days.  Denies fever or chills.  Sent at the request of Dr Jacinto Halim.  Past Medical History  Diagnosis Date  . Arthritis   . GERD (gastroesophageal reflux disease)   . Diabetes mellitus   . Hyperlipidemia   . Hypertension   . Coronary artery disease   . Vertigo   . Hearing loss     bilateral  . Trouble swallowing   . Easy bruising     Past Surgical History  Procedure Date  . Cholecystectomy   . Cardiac catheterization 2000  . Spine surgery 2004    cervical fusion    History reviewed. No pertinent family history.  Social History History  Substance Use Topics  . Smoking status: Former Games developer  . Smokeless tobacco: Never Used  . Alcohol Use: Yes    Allergies  Allergen Reactions  . Codeine Sulfate     REACTION: nausea  . Hydrocodone     nausea  . Percocet (Oxycodone-Acetaminophen)     nausea    Current Outpatient Prescriptions  Medication Sig Dispense Refill  . acetaminophen (TYLENOL) 500 MG tablet Take 500 mg by mouth every 6 (six) hours as needed.        . Alpha-Lipoic Acid 300 MG CAPS Take by mouth daily.        Marland Kitchen aspirin 325 MG tablet Take 325 mg by mouth daily.        Marland Kitchen atorvastatin (LIPITOR) 40 MG tablet       . co-enzyme Q-10 30 MG capsule Take 120 mg by mouth daily.        . cyclobenzaprine (FLEXERIL) 10 MG tablet Take 10 mg by mouth 2 (two) times daily as needed.        . diazepam (VALIUM) 5 MG tablet Take 5 mg by mouth every 6 (six) hours as needed.        . fish oil-omega-3 fatty acids 1000 MG capsule Take 2 g by mouth daily.        . Flaxseed, Linseed, (FLAXSEED OIL PO) Take by mouth daily.        Marland Kitchen glipiZIDE (GLUCOTROL) 10 MG tablet       . Grape Seed OIL by Does not apply route daily.        Marland Kitchen ibuprofen (ADVIL,MOTRIN) 200 MG  tablet Take 200 mg by mouth every 6 (six) hours as needed.        . Inositol Niacinate (NIACIN FLUSH FREE) 500 MG CAPS Take by mouth daily.        Marland Kitchen lisinopril (PRINIVIL,ZESTRIL) 20 MG tablet Take 20 mg by mouth daily.        . metFORMIN (GLUCOPHAGE) 1000 MG tablet Take 1,000 mg by mouth 2 (two) times daily with Duran meal.        . metoprolol succinate (TOPROL-XL) 25 MG 24 hr tablet       . Multiple Vitamin (MULTIVITAMIN PO) Take by mouth daily.        . Multiple Vitamins-Minerals (ZINC PO) Take by mouth daily.        . pantoprazole (PROTONIX) 40 MG tablet       . POTASSIUM GLUCONATE PO Take by mouth daily.        Marland Kitchen  sildenafil (VIAGRA) 100 MG tablet Take 100 mg by mouth daily as needed.        . vitamin B-12 (CYANOCOBALAMIN) 100 MCG tablet Take 50 mcg by mouth daily.          Review of Systems Review of Systems  Constitutional: Negative for fever and chills.  HENT: Negative.   Eyes: Negative.   Respiratory: Negative.   Cardiovascular: Negative.   Gastrointestinal: Negative.   Musculoskeletal: Negative.   Psychiatric/Behavioral: Negative.     Blood pressure 168/88, pulse 80, temperature 97.2 F (36.2 C), resp. rate 20, height 6\' 1"  (1.854 m), weight 250 lb 2 oz (113.456 kg).  Physical Exam Physical Exam  Constitutional: He appears well-developed and well-nourished.  HENT:  Head: Normocephalic and atraumatic.  Eyes: EOM are normal. Pupils are equal, round, and reactive to light.  Abdominal: Soft. Bowel sounds are normal.  Skin:       4 cm infected right groin epidermal inclusion cyst.  Psychiatric: He has Duran normal mood and affect. His behavior is normal.    Data Reviewed none  Assessment    4 CM INFECTED RIGHT GROIN EPIDERMAL INCLUSION CYST    Plan    Recommend incision and drainage in office today.  He agrees after discussing the procedure with the patient and risk as well as other options.  Under sterile conditions the right groin was prepped and draped in Duran sterile  fasion. 1% LIDOCAINE WA USED AND 10 CC INJECTED.  Incision was made and copious amounts of  material drained.  Silver nitrate applied.  Packed with iodoform packing and dry dressing.  Tolerated well. Follow up in two weeks.  Remove packing in 48 hours and cover with dry dressing. Doxycycline 100 mg po bid.       Patrick Duran. 12/23/2010, 2:51 PM

## 2010-12-25 ENCOUNTER — Telehealth (INDEPENDENT_AMBULATORY_CARE_PROVIDER_SITE_OTHER): Payer: Self-pay

## 2010-12-25 NOTE — Telephone Encounter (Signed)
Pt called to clarify his wound care from his procedure 2 days ago.  I read Dr Rosezena Sensor note and informed him to remove the packing today and shower.  He should let the soap and water rinse over the wound and pat it dry.  He should then cover it with a dry gauze dressing.  Pt voices understanding.

## 2011-02-09 ENCOUNTER — Other Ambulatory Visit: Payer: Self-pay

## 2011-02-09 ENCOUNTER — Emergency Department (HOSPITAL_COMMUNITY): Payer: 59

## 2011-02-09 ENCOUNTER — Emergency Department (HOSPITAL_COMMUNITY)
Admission: EM | Admit: 2011-02-09 | Discharge: 2011-02-09 | Disposition: A | Discharge status: Home / Self Care | Payer: 59 | Attending: Emergency Medicine | Admitting: Emergency Medicine

## 2011-02-09 ENCOUNTER — Encounter (HOSPITAL_COMMUNITY): Payer: Self-pay | Admitting: Emergency Medicine

## 2011-02-09 DIAGNOSIS — K219 Gastro-esophageal reflux disease without esophagitis: Secondary | ICD-10-CM | POA: Insufficient documentation

## 2011-02-09 DIAGNOSIS — R112 Nausea with vomiting, unspecified: Secondary | ICD-10-CM | POA: Insufficient documentation

## 2011-02-09 DIAGNOSIS — R079 Chest pain, unspecified: Secondary | ICD-10-CM | POA: Insufficient documentation

## 2011-02-09 DIAGNOSIS — I10 Essential (primary) hypertension: Secondary | ICD-10-CM | POA: Insufficient documentation

## 2011-02-09 DIAGNOSIS — K30 Functional dyspepsia: Secondary | ICD-10-CM

## 2011-02-09 DIAGNOSIS — J40 Bronchitis, not specified as acute or chronic: Secondary | ICD-10-CM | POA: Insufficient documentation

## 2011-02-09 DIAGNOSIS — E785 Hyperlipidemia, unspecified: Secondary | ICD-10-CM | POA: Insufficient documentation

## 2011-02-09 DIAGNOSIS — E119 Type 2 diabetes mellitus without complications: Secondary | ICD-10-CM | POA: Insufficient documentation

## 2011-02-09 DIAGNOSIS — I2510 Atherosclerotic heart disease of native coronary artery without angina pectoris: Secondary | ICD-10-CM | POA: Insufficient documentation

## 2011-02-09 DIAGNOSIS — K3189 Other diseases of stomach and duodenum: Secondary | ICD-10-CM | POA: Insufficient documentation

## 2011-02-09 DIAGNOSIS — R61 Generalized hyperhidrosis: Secondary | ICD-10-CM | POA: Insufficient documentation

## 2011-02-09 LAB — DIFFERENTIAL
Basophils Absolute: 0 10*3/uL (ref 0.0–0.1)
Basophils Relative: 0 % (ref 0–1)
Eosinophils Absolute: 0.3 10*3/uL (ref 0.0–0.7)
Eosinophils Relative: 3 % (ref 0–5)
Lymphocytes Relative: 15 % (ref 12–46)
Lymphs Abs: 1.5 10*3/uL (ref 0.7–4.0)
Monocytes Absolute: 0.7 10*3/uL (ref 0.1–1.0)
Monocytes Relative: 8 % (ref 3–12)
Neutro Abs: 7.4 10*3/uL (ref 1.7–7.7)
Neutrophils Relative %: 74 % (ref 43–77)

## 2011-02-09 LAB — HEPATIC FUNCTION PANEL
ALT: 25 U/L (ref 0–53)
AST: 14 U/L (ref 0–37)
Albumin: 3.3 g/dL — ABNORMAL LOW (ref 3.5–5.2)
Alkaline Phosphatase: 129 U/L — ABNORMAL HIGH (ref 39–117)
Bilirubin, Direct: 0.2 mg/dL (ref 0.0–0.3)
Indirect Bilirubin: 0.7 mg/dL (ref 0.3–0.9)
Total Bilirubin: 0.9 mg/dL (ref 0.3–1.2)
Total Protein: 6.6 g/dL (ref 6.0–8.3)

## 2011-02-09 LAB — POCT I-STAT TROPONIN I
Troponin i, poc: 0 ng/mL (ref 0.00–0.08)
Troponin i, poc: 0 ng/mL (ref 0.00–0.08)

## 2011-02-09 LAB — URINALYSIS, ROUTINE W REFLEX MICROSCOPIC
Bilirubin Urine: NEGATIVE
Glucose, UA: NEGATIVE mg/dL
Hgb urine dipstick: NEGATIVE
Ketones, ur: NEGATIVE mg/dL
Leukocytes, UA: NEGATIVE
Nitrite: NEGATIVE
Protein, ur: 30 mg/dL — AB
Specific Gravity, Urine: >1.03 — ABNORMAL HIGH (ref 1.005–1.030)
Urobilinogen, UA: 0.2 mg/dL (ref 0.0–1.0)
pH: 5.5 (ref 5.0–8.0)

## 2011-02-09 LAB — URINE MICROSCOPIC-ADD ON

## 2011-02-09 LAB — CBC
HCT: 39.6 % (ref 39.0–52.0)
Hemoglobin: 13.8 g/dL (ref 13.0–17.0)
MCH: 30.9 pg (ref 26.0–34.0)
MCHC: 34.8 g/dL (ref 30.0–36.0)
MCV: 88.6 fL (ref 78.0–100.0)
Platelets: 247 10*3/uL (ref 150–400)
RBC: 4.47 MIL/uL (ref 4.22–5.81)
RDW: 12.9 % (ref 11.5–15.5)
WBC: 11.4 10*3/uL — ABNORMAL HIGH (ref 4.0–10.5)

## 2011-02-09 LAB — BASIC METABOLIC PANEL
BUN: 12 mg/dL (ref 6–23)
CO2: 25 mEq/L (ref 19–32)
Calcium: 9.8 mg/dL (ref 8.4–10.5)
Chloride: 103 mEq/L (ref 96–112)
Creatinine, Ser: 0.97 mg/dL (ref 0.50–1.35)
GFR calc Af Amer: >90 mL/min (ref >90)
GFR calc non Af Amer: 85 mL/min — ABNORMAL LOW (ref >90)
Glucose, Bld: 130 mg/dL — ABNORMAL HIGH (ref 70–99)
Potassium: 3.7 mEq/L (ref 3.5–5.1)
Sodium: 139 mEq/L (ref 135–145)

## 2011-02-09 LAB — GLUCOSE, CAPILLARY: Glucose-Capillary: 138 mg/dL — ABNORMAL HIGH (ref 70–99)

## 2011-02-09 LAB — LIPASE, BLOOD: Lipase: 162 U/L — ABNORMAL HIGH (ref 11–59)

## 2011-02-09 MED ORDER — PANTOPRAZOLE SODIUM 40 MG IV SOLR
40.0000 mg | Freq: Once | INTRAVENOUS | Status: AC
  Administered 2011-02-09: 40 mg via INTRAVENOUS
  Filled 2011-02-09: qty 40

## 2011-02-09 MED ORDER — ONDANSETRON HCL 4 MG/2ML IJ SOLN
4.0000 mg | Freq: Once | INTRAMUSCULAR | Status: AC
  Administered 2011-02-09: 4 mg via INTRAVENOUS
  Filled 2011-02-09: qty 2

## 2011-02-09 MED ORDER — SUCRALFATE 1 G PO TABS: 1.0000 g | ORAL_TABLET | Freq: Four times a day (QID) | ORAL | Status: DC

## 2011-02-09 MED ORDER — AZITHROMYCIN 250 MG PO TABS: ORAL_TABLET | ORAL | Status: AC

## 2011-02-09 MED ORDER — GI COCKTAIL ~~LOC~~
30.0000 mL | Freq: Once | ORAL | Status: AC
  Administered 2011-02-09: 30 mL via ORAL
  Filled 2011-02-09: qty 30

## 2011-02-09 MED ORDER — ASPIRIN 81 MG PO CHEW
324.0000 mg | CHEWABLE_TABLET | Freq: Once | ORAL | Status: AC
  Administered 2011-02-09: 324 mg via ORAL
  Filled 2011-02-09: qty 4

## 2011-02-09 MED ORDER — SODIUM CHLORIDE 0.9 % IV BOLUS (SEPSIS)
500.0000 mL | Freq: Once | INTRAVENOUS | Status: AC
  Administered 2011-02-09: 1000 mL via INTRAVENOUS

## 2011-02-09 NOTE — ED Notes (Signed)
Pt. Vomiting bile,

## 2011-02-09 NOTE — ED Notes (Signed)
PT. REPORTS GENERALIZED CHEST PAIN WITH SOB AND PRODUCTIVE COUGH ONSET LAST NIGHT,  NO NAUSEA OR VOMITTING ,  SLIGHT DIAPHORESIS,  TOOK 1 ASA LAST NIGHT .

## 2011-02-09 NOTE — ED Notes (Signed)
Pt. Discharged to home with wife, NAD noted, Pt. Alert and oriented, ambulatory, gait steady

## 2011-02-09 NOTE — ED Provider Notes (Signed)
Medical screening examination/treatment/procedure(s) were performed by non-physician practitioner and as supervising physician I was immediately available for consultation/collaboration.    Nelia Shi, MD 02/09/11 [phone]

## 2011-02-09 NOTE — ED Provider Notes (Signed)
History     CSN: [id number] Arrival date & time: 02/09/2011  6:41 AM   First MD Initiated Contact with Patient 02/09/11 [phone]      Chief Complaint  Patient presents with  . Chest Pain    (Consider location/radiation/quality/duration/timing/severity/associated sxs/prior treatment) HPI  Patient with remote hx of cardiac stent placement who is followed by Dr. Nadara Eaton for cardiology presents to ER complaining of gradual onset "sour stomach or indigestion" that began yesterday and temporarily improve with PO pepto but then returned again last night and despite second dose of pepto persisted with sensation of "burning or sour stomach" but then states last night began to have a radiatio of dull discomfort into chest that "is a vague discomfort" and associated with nausea and diaphoresis and one episode of vomiting once he entered exam room. Despite abdominal discomfort, patient denies palpable pain. Denies known fevers. Patient notes that he was been "dealing with bronchitis like symptoms for weeks" complaining of a few week hx of persistent cough despite being on prednisone.  Past Medical History  Diagnosis Date  . Arthritis   . GERD (gastroesophageal reflux disease)   . Diabetes mellitus   . Hyperlipidemia   . Hypertension   . Coronary artery disease   . Vertigo   . Hearing loss     bilateral  . Trouble swallowing   . Easy bruising     Past Surgical History  Procedure Date  . Cholecystectomy   . Cardiac catheterization 2000  . Spine surgery 2004    cervical fusion    No family history on file.  History  Substance Use Topics  . Smoking status: Former Games developer  . Smokeless tobacco: Never Used  . Alcohol Use: Yes      Review of Systems  All other systems reviewed and are negative.    Allergies  Codeine sulfate; Hydrocodone; and Percocet  Home Medications   Current Outpatient Rx  Name Route Sig Dispense Refill  . ACETAMINOPHEN 500 MG PO TABS Oral Take 500 mg by  mouth every 6 (six) hours as needed. For pain    . ALPHA-LIPOIC ACID 300 MG PO CAPS Oral Take by mouth daily.      . ASPIRIN 325 MG PO TABS Oral Take 325 mg by mouth daily.      . ATORVASTATIN CALCIUM 40 MG PO TABS Oral Take 40 mg by mouth daily.     Marland Kitchen BENZONATATE 100 MG PO CAPS Oral Take 100 mg by mouth 3 (three) times daily as needed. For couth     . CARVEDILOL 6.25 MG PO TABS Oral Take 6.25 mg by mouth 2 (two) times daily with a meal.      . COENZYME Q10 30 MG PO CAPS Oral Take 120 mg by mouth daily.      . CYCLOBENZAPRINE HCL 10 MG PO TABS Oral Take 10 mg by mouth 2 (two) times daily as needed. For spams    . DIAZEPAM 5 MG PO TABS Oral Take 5 mg by mouth every 6 (six) hours as needed. For anxiety    . OMEGA-3 FATTY ACIDS 1000 MG PO CAPS Oral Take 2 g by mouth daily.      Marland Kitchen FLAXSEED OIL PO Oral Take by mouth daily.      Marland Kitchen GLIPIZIDE 10 MG PO TABS Oral Take 10 mg by mouth 2 (two) times daily before a meal.     . GRAPE SEED OIL Oral Take by mouth daily.     . IBUPROFEN  200 MG PO TABS Oral Take 200 mg by mouth every 6 (six) hours as needed. For pain    . INOSITOL NIACINATE 500 MG PO CAPS Oral Take by mouth daily.      Marland Kitchen LISINOPRIL 20 MG PO TABS Oral Take 20 mg by mouth daily.      Marland Kitchen METFORMIN HCL 1000 MG PO TABS Oral Take 1,000 mg by mouth 2 (two) times daily with a meal.      . MULTIVITAMIN PO Oral Take by mouth daily.      Marland Kitchen ZINC PO Oral Take by mouth daily.      Marland Kitchen PANTOPRAZOLE SODIUM 40 MG PO TBEC Oral Take 40 mg by mouth 2 (two) times daily.     Marland Kitchen POTASSIUM GLUCONATE PO Oral Take by mouth daily.      Marland Kitchen PREDNISONE PO Oral Take 1 tablet by mouth daily.      Marland Kitchen SILDENAFIL CITRATE 100 MG PO TABS Oral Take 100 mg by mouth daily as needed. For erectile dysfunction    . VITAMIN B-12 100 MCG PO TABS Oral Take 50 mcg by mouth daily.        BP 144/84  Pulse 92  Temp(Src) 97.8 F (36.6 C) (Oral)  Resp 18  SpO2 98%  Physical Exam  Nursing note and vitals reviewed. Constitutional: He is  oriented to person, place, and time. He appears well-developed and well-nourished. No distress.  HENT:  Head: Normocephalic and atraumatic.  Eyes: Conjunctivae are normal.  Neck: Normal range of motion. Neck supple.  Cardiovascular: Normal rate, regular rhythm, normal heart sounds and intact distal pulses.  Exam reveals no gallop and no friction rub.   No murmur heard. Pulmonary/Chest: Effort normal and breath sounds normal. No respiratory distress. He has no wheezes. He has no rales. He exhibits no tenderness.  Abdominal: Bowel sounds are normal. He exhibits no distension and no mass. There is no tenderness. There is no rebound and no guarding.  Musculoskeletal: Normal range of motion. He exhibits no edema and no tenderness.  Neurological: He is alert and oriented to person, place, and time.  Skin: Skin is warm and dry. No rash noted. He is not diaphoretic. No erythema.  Psychiatric: He has a normal mood and affect.    ED Course  Procedures (including critical care time)  7:55 AM PO aspirin, IV fluids and IV zofran.  Patient notes that he had a stress test a few weeks ago without significant findings. Patient is feeling better. Despite cough x 3 weeks patient denies fevers. States he has been on a course of abx during this 3 week period without improvement in cough.   Labs Reviewed  CBC - Abnormal; Notable for the following:    WBC 11.4 (*)    All other components within normal limits  BASIC METABOLIC PANEL - Abnormal; Notable for the following:    Glucose, Bld 130 (*)    GFR calc non Af Amer 85 (*)    All other components within normal limits  LIPASE, BLOOD - Abnormal; Notable for the following:    Lipase 162 (*)    All other components within normal limits  URINALYSIS, ROUTINE W REFLEX MICROSCOPIC - Abnormal; Notable for the following:    Specific Gravity, Urine >1.030 (*)    Protein, ur 30 (*)    All other components within normal limits  HEPATIC FUNCTION PANEL - Abnormal;  Notable for the following:    Albumin 3.3 (*)    Alkaline Phosphatase 129 (*)  All other components within normal limits  GLUCOSE, CAPILLARY - Abnormal; Notable for the following:    Glucose-Capillary 138 (*)    All other components within normal limits  POCT I-STAT TROPONIN I  DIFFERENTIAL  URINE MICROSCOPIC-ADD ON  POCT CARDIAC MARKERS  POCT CBG MONITORING  I-STAT TROPONIN I    Date: 02/09/2011  Rate: 64  Rhythm: normal sinus rhythm  QRS Axis: normal  Intervals: normal  ST/T Wave abnormalities: normal  Conduction Disutrbances:none  Narrative Interpretation:   Old EKG Reviewed: September 12 2008 comparison without significant changes   Dg Chest 2 View  02/09/2011  *RADIOLOGY REPORT*  Clinical Data: Chest pain.  Cough.  CHEST - 2 VIEW  Comparison: None.  Findings: Cardiopericardial silhouette upper limits of normal for size.  Tortuous thoracic aorta.  Lower cervical ACDF.  Bosselation of the right hemidiaphragm.  Left basilar density is present over the hemidiaphragm on the frontal view, suspicious for small focus of pneumonia.  This is not definitively identified on the lateral view.  Cardiac stents are visualized over the cardiopericardial silhouette on the lateral view.  IMPRESSION: Small focus of opacity over the left hemidiaphragm may represent atelectasis or pneumonia.  Original Report Authenticated By: Andreas Newport, M.D.     1. Indigestion   2. Bronchitis       MDM  Patient with 2 sets of negative cardiac markers multiple hours apart denying chest pain while waiting in the ER with complaints of indigestion and abdominal burning abdomen soft and nontender is agreeable to following up with primary care doctor. Patient was on amoxicillin for 10 days about a week ago and despite no fevers and improving cough questionable pneumonia on chest x-ray and will retreat with Z-Pak and have patient follow up for general practitioner.        [place], [place] 02/09/11 1157

## 2013-09-17 ENCOUNTER — Encounter: Payer: Self-pay | Admitting: Neurology

## 2013-09-25 ENCOUNTER — Encounter (INDEPENDENT_AMBULATORY_CARE_PROVIDER_SITE_OTHER): Payer: Self-pay

## 2013-09-25 ENCOUNTER — Ambulatory Visit (INDEPENDENT_AMBULATORY_CARE_PROVIDER_SITE_OTHER): Payer: Medicare Other | Admitting: Neurology

## 2013-09-25 ENCOUNTER — Encounter: Payer: Self-pay | Admitting: Neurology

## 2013-09-25 VITALS — BP 138/93 | HR 65 | Temp 97.4°F | Ht 73.0 in | Wt 243.0 lb

## 2013-09-25 DIAGNOSIS — G2581 Restless legs syndrome: Secondary | ICD-10-CM

## 2013-09-25 DIAGNOSIS — R400 Somnolence: Secondary | ICD-10-CM

## 2013-09-25 DIAGNOSIS — R609 Edema, unspecified: Secondary | ICD-10-CM

## 2013-09-25 DIAGNOSIS — R519 Headache, unspecified: Secondary | ICD-10-CM

## 2013-09-25 DIAGNOSIS — G4710 Hypersomnia, unspecified: Secondary | ICD-10-CM

## 2013-09-25 DIAGNOSIS — R351 Nocturia: Secondary | ICD-10-CM

## 2013-09-25 DIAGNOSIS — R600 Localized edema: Secondary | ICD-10-CM

## 2013-09-25 DIAGNOSIS — R0609 Other forms of dyspnea: Secondary | ICD-10-CM

## 2013-09-25 DIAGNOSIS — R51 Headache: Secondary | ICD-10-CM

## 2013-09-25 DIAGNOSIS — G4761 Periodic limb movement disorder: Secondary | ICD-10-CM

## 2013-09-25 DIAGNOSIS — R0683 Snoring: Secondary | ICD-10-CM

## 2013-09-25 DIAGNOSIS — R0989 Other specified symptoms and signs involving the circulatory and respiratory systems: Secondary | ICD-10-CM

## 2013-09-25 DIAGNOSIS — E669 Obesity, unspecified: Secondary | ICD-10-CM

## 2013-09-25 NOTE — Progress Notes (Signed)
Subjective:    Patient ID: Patrick Duran is a 68 y.o. male.  HPI    Patrick Foley, MD, PhD [facility] [address], [address] [address] [place], [place] [address]  Dear Patrick Duran,   I saw your patient, Patrick Duran, upon your kind request in my neurologic clinic today for initial consultation of his sleep disorder, in particular, concern for underlying obstructive sleep apnea. The patient is unaccompanied today. As you know, [name] is a 68 year old right-handed gentleman with an underlying abnormal history of diabetes, hypertension, hyperlipidemia, coronary artery disease, status post stenting in 2000, former smoking, reflux disease, plantar fasciitis, history of peripheral neuropathy, cervical fusion in 2004, vertigo (20 years ago with severe hearing loss on L), who reports daytime somnolence and snoring, which, per wife's report is mild to moderate. She previously had noted some gasping sounds. He has rare sensations of gasping. He reports rare morning headaches. His ESS is 6/24 and he feels, since his LE is improved after you changed his Coreg and his amlodipine. He has a bedtime of 10:30 to 11 PM and he falls asleep okay, but has some issues staying asleep. He has nocturia x 1 per night usually. He has several middle of the night awakenings. He does have occasional and milder RLS symptoms and has been told that he kicks in his sleep. He has occasional paresthesias on the R arm when sleeping on the R side and has occasional tingling in the lateral R thigh after recent weight loss. He believes, he lost some fluid weight. He wakes up usually between 6:30 and 7 AM. He feels  He still works part-time as an Biomedical engineer. He works mostly from home. He drinks about 6 cups of coffee per day, no soda, occasional iced tea, with splenda. He drinks alcohol rarely. He feels marginally rested first thing in morning. He takes a nap daily.   He is a restless sleeper and in the  morning, the bed is quite disheveled.   He denies cataplexy, sleep paralysis, hypnagogic or hypnopompic hallucinations, or sleep attacks. He does not report any vivid dreams, nightmares, dream enactments, or parasomnias, such as sleep talking or sleep walking. The patient has not had a sleep study or a home sleep test.   His bedroom is usually dark and cool. There is a TV in the bedroom and usually it is not on at night.   His Past Medical History Is Significant For: Past Medical History  Diagnosis Date  . Arthritis   . GERD (gastroesophageal reflux disease)   . Diabetes mellitus   . Hyperlipidemia   . Hypertension   . Coronary artery disease   . Vertigo   . Hearing loss     bilateral  . Trouble swallowing   . Easy bruising     His Past Surgical History Is Significant For: Past Surgical History  Procedure Laterality Date  . Cholecystectomy    . Cardiac catheterization  2000  . Spine surgery  2004    cervical fusion    His Family History Is Significant For: Family History  Problem Relation Age of Onset  . Diabetes Mother   . Hypothyroidism Mother     His Social History Is Significant For: History   Social History  . Marital Status: Married    Spouse Name: N/A    Number of Children: N/A  . Years of Education: N/A   Social History Main Topics  . Smoking status: Former Games developer  . Smokeless  tobacco: Never Used  . Alcohol Use: Yes  . Drug Use: No  . Sexual Activity:    Other Topics Concern  . None   Social History Narrative  . None    His Allergies Are:  Allergies  Allergen Reactions  . Codeine Sulfate     REACTION: nausea  . Hydrocodone     nausea  . Percocet [Oxycodone-Acetaminophen]     nausea  :   His Current Medications Are:  Outpatient Encounter Prescriptions as of 09/25/2013  Medication Sig  . acetaminophen (TYLENOL) 500 MG tablet Take 500 mg by mouth every 6 (six) hours as needed. For pain  . Alpha-Lipoic Acid 300 MG CAPS Take by mouth daily.     Marland Kitchen. aspirin 325 MG tablet Take 325 mg by mouth daily.    Marland Kitchen. atorvastatin (LIPITOR) 40 MG tablet Take 40 mg by mouth daily.   . benzonatate (TESSALON) 100 MG capsule Take 100 mg by mouth 3 (three) times daily as needed. For couth   . carvedilol (COREG) 6.25 MG tablet Take 6.25 mg by mouth 2 (two) times daily with a meal.    . co-enzyme Q-10 30 MG capsule Take 120 mg by mouth daily.    . cyclobenzaprine (FLEXERIL) 10 MG tablet Take 10 mg by mouth 2 (two) times daily as needed. For spams  . diazepam (VALIUM) 5 MG tablet Take 5 mg by mouth every 6 (six) hours as needed. For anxiety  . fish oil-omega-3 fatty acids 1000 MG capsule Take 2 g by mouth daily.    . Flaxseed, Linseed, (FLAXSEED OIL PO) Take by mouth daily.    Marland Kitchen. glipiZIDE (GLUCOTROL) 10 MG tablet Take 10 mg by mouth 2 (two) times daily before a meal.   . Grape Seed OIL Take by mouth daily.   Marland Kitchen. ibuprofen (ADVIL,MOTRIN) 200 MG tablet Take 200 mg by mouth every 6 (six) hours as needed. For pain  . Inositol Niacinate (NIACIN FLUSH FREE) 500 MG CAPS Take by mouth daily.    Marland Kitchen. lisinopril (PRINIVIL,ZESTRIL) 20 MG tablet Take 20 mg by mouth daily.    . metFORMIN (GLUCOPHAGE) 1000 MG tablet Take 1,000 mg by mouth 2 (two) times daily with a meal.    . Multiple Vitamin (MULTIVITAMIN PO) Take by mouth daily.    . Multiple Vitamins-Minerals (ZINC PO) Take by mouth daily.    . pantoprazole (PROTONIX) 40 MG tablet Take 40 mg by mouth 2 (two) times daily.   Marland Kitchen. POTASSIUM GLUCONATE PO Take by mouth daily.    Marland Kitchen. PREDNISONE PO Take 10 tablets by mouth daily.   . sildenafil (VIAGRA) 100 MG tablet Take 100 mg by mouth daily as needed. For erectile dysfunction  . vitamin B-12 (CYANOCOBALAMIN) 100 MCG tablet Take 50 mcg by mouth daily.    . sucralfate (CARAFATE) 1 G tablet Take 1 tablet (1 g total) by mouth 4 (four) times daily.  :  Review of Systems:  Out of a complete 14 point review of systems, all are reviewed and negative with the exception of these  symptoms as listed below:   Review of Systems  Constitutional: Positive for fatigue and unexpected weight change.  HENT: Positive for hearing loss, rhinorrhea and tinnitus.   Eyes: Negative.   Respiratory: Negative.   Cardiovascular: Negative.   Gastrointestinal: Negative.   Endocrine: Negative.   Genitourinary:       Impotence  Musculoskeletal: Negative.   Skin: Negative.   Allergic/Immunologic: Positive for environmental allergies.  Neurological: Positive for dizziness  and numbness.  Hematological: Negative.   Psychiatric/Behavioral: Positive for sleep disturbance (insomnia, eds, snoring).    Objective:  Neurologic Exam  Physical Exam Physical Examination:   Filed Vitals:   09/25/13 1045  BP: 138/93  Pulse: 65  Temp: 97.4 F (36.3 C)   General Examination: The patient is a very pleasant 68 y.o. male in no acute distress. He appears well-developed and well-nourished and well groomed. He is obese.  HEENT: Normocephalic, atraumatic, pupils are equal, round and reactive to light and accommodation. Funduscopic exam is normal with sharp disc margins noted. Extraocular tracking is good without limitation to gaze excursion or nystagmus noted. Normal smooth pursuit is noted. Hearing is significantly impaired on the left and he has a hearing aid on the right. Tympanic membranes are clear bilaterally. Face is symmetric with normal facial animation and normal facial sensation. Speech is clear with no dysarthria noted. There is no hypophonia. There is no lip, neck/head, jaw or voice tremor. Neck is supple with full range of passive and active motion. There are no carotid bruits on auscultation. Oropharynx exam reveals: moderate mouth dryness, adequate dental hygiene and marked airway crowding, due to thick soft palatal tissue and larger uvula and larger tongue. Mallampati is class II. Tongue protrudes centrally and palate elevates symmetrically. Tonsils are 1+ in size. Neck size is 17.5  inches. He has a very mild overbite. Nasal inspection reveals no significant nasal mucosal bogginess or redness and no septal deviation.   Chest: Clear to auscultation without wheezing, rhonchi or crackles noted.  Heart: S1+S2+0, regular and normal without murmurs, rubs or gallops noted.   Abdomen: Soft, non-tender and non-distended with normal bowel sounds appreciated on auscultation.  Extremities: There is 1+ to 2+ pitting edema in the distal lower extremities bilaterally, L more than R. Pedal pulses are intact.  Skin: Warm and dry without trophic changes noted. There are no varicose veins.  Musculoskeletal: exam reveals no obvious joint deformities, tenderness or joint swelling or erythema.   Neurologically:  Mental status: The patient is awake, alert and oriented in all 4 spheres. His immediate and remote memory, attention, language skills and fund of knowledge are appropriate. There is no evidence of aphasia, agnosia, apraxia or anomia. Speech is clear with normal prosody and enunciation. Thought process is linear. Mood is normal and affect is normal.  Cranial nerves II - XII are as described above under HEENT exam. In addition: shoulder shrug is normal with equal shoulder height noted. Motor exam: Normal bulk, strength and tone is noted. There is no drift, tremor or rebound. Romberg is negative. Reflexes are 2+ throughout. Babinski: Toes are flexor bilaterally. Fine motor skills and coordination: intact with normal finger taps, normal hand movements, normal rapid alternating patting, normal foot taps and normal foot agility.  Cerebellar testing: No dysmetria or intention tremor on finger to nose testing. Heel to shin is unremarkable bilaterally. There is no truncal or gait ataxia.  Sensory exam: intact to light touch, pinprick, vibration, temperature sense in the upper extremities, but with mild decrease in temp and PP and vibration sensation in the distal LEs up to mid shin areas, slightly  worse on L.  Gait, station and balance: He stands easily. No veering to one side is noted. No leaning to one side is noted. Posture is age-appropriate and stance is narrow based. Gait shows normal stride length and normal pace. No problems turning are noted. He turns en bloc. Tandem walk is difficult for him. Intact toe and heel  stance is noted.               Assessment and Plan:   In summary, SAKSHAM HARDEBECK is a very pleasant 68 y.o.-year old male with a history and physical exam concerning for obstructive sleep apnea (OSA). He reports snoring, some daytime tiredness, rare morning headaches, and in light of a tighter looking airway and his obesity there is a fairly high pretest probability for OSA. He also reports that he has had a blood pressure arise in the past few months. He is working on improving his blood pressure and has also been able to lose weight. His lower extremity swelling has improved but is still there. He also has evidence of mild peripheral neuropathy, most likely diabetic in etiology. He also has a history consistent with mild restless leg syndrome and is known to take in his sleep, probably in keeping with PLMs.  I had a long chat with the patient about my findings and the diagnosis of OSA, its prognosis and treatment options. We talked about medical treatments, surgical interventions and non-pharmacological approaches. I explained in particular the risks and ramifications of untreated moderate to severe OSA, especially with respect to developing cardiovascular disease down the Road, including congestive heart failure, difficult to treat hypertension, cardiac arrhythmias, or stroke. Even type 2 diabetes has, in part, been linked to untreated OSA. Symptoms of untreated OSA include daytime sleepiness, memory problems, mood irritability and mood disorder such as depression and anxiety, lack of energy, as well as recurrent headaches, especially morning headaches. We talked about trying to  maintain a healthy lifestyle in general, as well as the importance of weight control. I encouraged the patient to eat healthy, exercise daily and keep well hydrated, to keep a scheduled bedtime and wake time routine, to not skip any meals and eat healthy snacks in between meals. I advised the patient not to drive when feeling sleepy. I recommended the following at this time: sleep study with potential positive airway pressure titration.  I explained the sleep test procedure to the patient and also outlined possible surgical and non-surgical treatment options of OSA, including the use of a custom-made dental device (which would require a referral to a specialist dentist or oral surgeon), upper airway surgical options, such as pillar implants, radiofrequency surgery, tongue base surgery, and UPPP (which would involve a referral to an ENT surgeon). Rarely, jaw surgery such as mandibular advancement may be considered.  I also explained the CPAP treatment option to the patient, who indicated that he would be willing to try CPAP if the need arises, however he is worried about the cost involved and he is worried about not being able to sleep. Of note, he sometimes utilizes Benadryl at night. I advised him to bring Benadryl in case he does not fall asleep quickly enough. He denies daytime grogginess from taking Benadryl at night and indicates that he is able to drive the next day. I explained the importance of being compliant with PAP treatment, not only for insurance purposes but primarily to improve His symptoms, and for the patient's long term health benefit, including to reduce His cardiovascular risks. I answered all his questions today and the patient was in agreement. I would like to see him back after the sleep study is completed and encouraged him to call with any interim questions, concerns, problems or updates.   Thank you very much for allowing me to participate in the care of this nice patient. If I can  be of any further assistance to you please do not hesitate to call me at [phone].  Sincerely,   Star Age, MD, PhD

## 2013-09-25 NOTE — Patient Instructions (Signed)
Based on your symptoms and your exam I believe you are at risk for obstructive sleep apnea or OSA, and I think we should proceed with a sleep study to determine whether you do or do not have OSA and how severe it is. If you have more than mild OSA, I want you to consider treatment with CPAP. Please remember, the risks and ramifications of moderate to severe obstructive sleep apnea or OSA are: Cardiovascular disease, including congestive heart failure, stroke, difficult to control hypertension, arrhythmias, and even type 2 diabetes has been linked to untreated OSA. Sleep apnea causes disruption of sleep and sleep deprivation in most cases, which, in turn, can cause recurrent headaches, problems with memory, mood, concentration, focus, and vigilance. Most people with untreated sleep apnea report excessive daytime sleepiness, which can affect their ability to drive. Please do not drive if you feel sleepy.  I will see you back after your sleep study to go over the test results and where to go from there. We will call you after your sleep study and to set up an appointment at the time.   You may bring your benadryl for the sleep study in case you feel you have trouble falling asleep.

## 2014-03-29 DIAGNOSIS — I219 Acute myocardial infarction, unspecified: Secondary | ICD-10-CM

## 2014-03-29 HISTORY — DX: Acute myocardial infarction, unspecified: I21.9

## 2014-04-13 ENCOUNTER — Emergency Department (HOSPITAL_COMMUNITY): Payer: Medicare Other

## 2014-04-13 ENCOUNTER — Encounter (HOSPITAL_COMMUNITY): Payer: Self-pay | Admitting: Cardiology

## 2014-04-13 ENCOUNTER — Inpatient Hospital Stay (HOSPITAL_COMMUNITY)
Admission: EM | Admit: 2014-04-13 | Discharge: 2014-04-16 | DRG: 247 | Disposition: A | Discharge status: Home / Self Care | Payer: Medicare Other | Attending: Cardiology | Admitting: Cardiology

## 2014-04-13 DIAGNOSIS — I25110 Atherosclerotic heart disease of native coronary artery with unstable angina pectoris: Secondary | ICD-10-CM | POA: Diagnosis present

## 2014-04-13 DIAGNOSIS — Y92230 Patient room in hospital as the place of occurrence of the external cause: Secondary | ICD-10-CM

## 2014-04-13 DIAGNOSIS — H9193 Unspecified hearing loss, bilateral: Secondary | ICD-10-CM | POA: Diagnosis present

## 2014-04-13 DIAGNOSIS — I200 Unstable angina: Secondary | ICD-10-CM | POA: Diagnosis present

## 2014-04-13 DIAGNOSIS — T82857A Stenosis of cardiac prosthetic devices, implants and grafts, initial encounter: Secondary | ICD-10-CM | POA: Diagnosis present

## 2014-04-13 DIAGNOSIS — R001 Bradycardia, unspecified: Secondary | ICD-10-CM | POA: Diagnosis present

## 2014-04-13 DIAGNOSIS — Z79899 Other long term (current) drug therapy: Secondary | ICD-10-CM | POA: Diagnosis not present

## 2014-04-13 DIAGNOSIS — E669 Obesity, unspecified: Secondary | ICD-10-CM | POA: Diagnosis present

## 2014-04-13 DIAGNOSIS — I129 Hypertensive chronic kidney disease with stage 1 through stage 4 chronic kidney disease, or unspecified chronic kidney disease: Secondary | ICD-10-CM | POA: Diagnosis present

## 2014-04-13 DIAGNOSIS — Z6831 Body mass index (BMI) 31.0-31.9, adult: Secondary | ICD-10-CM | POA: Diagnosis not present

## 2014-04-13 DIAGNOSIS — Z833 Family history of diabetes mellitus: Secondary | ICD-10-CM

## 2014-04-13 DIAGNOSIS — I97610 Postprocedural hemorrhage and hematoma of a circulatory system organ or structure following a cardiac catheterization: Secondary | ICD-10-CM | POA: Diagnosis not present

## 2014-04-13 DIAGNOSIS — N183 Chronic kidney disease, stage 3 (moderate): Secondary | ICD-10-CM | POA: Diagnosis present

## 2014-04-13 DIAGNOSIS — Z87891 Personal history of nicotine dependence: Secondary | ICD-10-CM | POA: Diagnosis not present

## 2014-04-13 DIAGNOSIS — Y831 Surgical operation with implant of artificial internal device as the cause of abnormal reaction of the patient, or of later complication, without mention of misadventure at the time of the procedure: Secondary | ICD-10-CM | POA: Diagnosis present

## 2014-04-13 DIAGNOSIS — Y840 Cardiac catheterization as the cause of abnormal reaction of the patient, or of later complication, without mention of misadventure at the time of the procedure: Secondary | ICD-10-CM | POA: Diagnosis not present

## 2014-04-13 DIAGNOSIS — Z7952 Long term (current) use of systemic steroids: Secondary | ICD-10-CM

## 2014-04-13 DIAGNOSIS — Z91010 Allergy to peanuts: Secondary | ICD-10-CM

## 2014-04-13 DIAGNOSIS — Z7982 Long term (current) use of aspirin: Secondary | ICD-10-CM | POA: Diagnosis not present

## 2014-04-13 DIAGNOSIS — Z885 Allergy status to narcotic agent status: Secondary | ICD-10-CM | POA: Diagnosis not present

## 2014-04-13 DIAGNOSIS — E1122 Type 2 diabetes mellitus with diabetic chronic kidney disease: Secondary | ICD-10-CM | POA: Diagnosis present

## 2014-04-13 DIAGNOSIS — Z91012 Allergy to eggs: Secondary | ICD-10-CM

## 2014-04-13 DIAGNOSIS — E1165 Type 2 diabetes mellitus with hyperglycemia: Secondary | ICD-10-CM | POA: Diagnosis present

## 2014-04-13 DIAGNOSIS — I214 Non-ST elevation (NSTEMI) myocardial infarction: Principal | ICD-10-CM | POA: Diagnosis present

## 2014-04-13 DIAGNOSIS — E782 Mixed hyperlipidemia: Secondary | ICD-10-CM | POA: Diagnosis present

## 2014-04-13 DIAGNOSIS — K219 Gastro-esophageal reflux disease without esophagitis: Secondary | ICD-10-CM | POA: Diagnosis present

## 2014-04-13 DIAGNOSIS — R079 Chest pain, unspecified: Secondary | ICD-10-CM | POA: Diagnosis present

## 2014-04-13 DIAGNOSIS — Z955 Presence of coronary angioplasty implant and graft: Secondary | ICD-10-CM

## 2014-04-13 LAB — BASIC METABOLIC PANEL
Anion gap: 10 (ref 5–15)
BUN: 20 mg/dL (ref 6–23)
CO2: 25 mmol/L (ref 19–32)
Calcium: 9.8 mg/dL (ref 8.4–10.5)
Chloride: 102 mEq/L (ref 96–112)
Creatinine, Ser: 1.4 mg/dL — ABNORMAL HIGH (ref 0.50–1.35)
GFR calc Af Amer: 59 mL/min — ABNORMAL LOW (ref >90)
GFR calc non Af Amer: 50 mL/min — ABNORMAL LOW (ref >90)
Glucose, Bld: 278 mg/dL — ABNORMAL HIGH (ref 70–99)
Potassium: 4.2 mmol/L (ref 3.5–5.1)
Sodium: 137 mmol/L (ref 135–145)

## 2014-04-13 LAB — CBG MONITORING, ED: Glucose-Capillary: 201 mg/dL — ABNORMAL HIGH (ref 70–99)

## 2014-04-13 LAB — I-STAT TROPONIN, ED: Troponin i, poc: 0.07 ng/mL (ref 0.00–0.08)

## 2014-04-13 LAB — CBC
HCT: 39.9 % (ref 39.0–52.0)
Hemoglobin: 13.8 g/dL (ref 13.0–17.0)
MCH: 31.2 pg (ref 26.0–34.0)
MCHC: 34.6 g/dL (ref 30.0–36.0)
MCV: 90.1 fL (ref 78.0–100.0)
Platelets: 227 10*3/uL (ref 150–400)
RBC: 4.43 MIL/uL (ref 4.22–5.81)
RDW: 12.6 % (ref 11.5–15.5)
WBC: 5.9 10*3/uL (ref 4.0–10.5)

## 2014-04-13 LAB — HEMOGLOBIN A1C
Hgb A1c MFr Bld: 7 % — ABNORMAL HIGH (ref <5.7)
Mean Plasma Glucose: 154 mg/dL — ABNORMAL HIGH (ref <117)

## 2014-04-13 LAB — HEPATIC FUNCTION PANEL
ALT: 42 U/L (ref 0–53)
AST: 32 U/L (ref 0–37)
Albumin: 3.9 g/dL (ref 3.5–5.2)
Alkaline Phosphatase: 104 U/L (ref 39–117)
Bilirubin, Direct: 0.1 mg/dL (ref 0.0–0.3)
Indirect Bilirubin: 0.9 mg/dL (ref 0.3–0.9)
Total Bilirubin: 1 mg/dL (ref 0.3–1.2)
Total Protein: 6.7 g/dL (ref 6.0–8.3)

## 2014-04-13 LAB — TROPONIN I
Troponin I: 0.11 ng/mL — ABNORMAL HIGH (ref <0.031)
Troponin I: 0.13 ng/mL — ABNORMAL HIGH (ref <0.031)
Troponin I: 0.2 ng/mL — ABNORMAL HIGH (ref <0.031)

## 2014-04-13 LAB — GLUCOSE, CAPILLARY
Glucose-Capillary: 159 mg/dL — ABNORMAL HIGH (ref 70–99)
Glucose-Capillary: 151 mg/dL — ABNORMAL HIGH (ref 70–99)

## 2014-04-13 MED ORDER — SODIUM CHLORIDE 0.9 % IV SOLN: 250.0000 mL | INTRAVENOUS | Status: DC | PRN

## 2014-04-13 MED ORDER — SODIUM CHLORIDE 0.9 % IJ SOLN
3.0000 mL | Freq: Two times a day (BID) | INTRAMUSCULAR | Status: DC
  Administered 2014-04-13 – 2014-04-14 (×2): 3 mL via INTRAVENOUS

## 2014-04-13 MED ORDER — HYDROCHLOROTHIAZIDE 25 MG PO TABS: 25.0000 mg | ORAL_TABLET | Freq: Every day | ORAL | Status: DC

## 2014-04-13 MED ORDER — ONDANSETRON HCL 4 MG/2ML IJ SOLN
4.0000 mg | Freq: Once | INTRAMUSCULAR | Status: AC
  Administered 2014-04-13: 4 mg via INTRAVENOUS
  Filled 2014-04-13: qty 2

## 2014-04-13 MED ORDER — NITROGLYCERIN IN D5W 200-5 MCG/ML-% IV SOLN
3.0000 ug/min | INTRAVENOUS | Status: DC
Start: 2014-04-13 — End: 2014-04-16
  Administered 2014-04-13: 5 ug/min via INTRAVENOUS
  Filled 2014-04-13: qty 250

## 2014-04-13 MED ORDER — ONDANSETRON HCL 4 MG/2ML IJ SOLN
4.0000 mg | Freq: Four times a day (QID) | INTRAMUSCULAR | Status: DC | PRN
  Administered 2014-04-15: 4 mg via INTRAVENOUS
  Filled 2014-04-13 (×2): qty 2

## 2014-04-13 MED ORDER — INSULIN ASPART 100 UNIT/ML ~~LOC~~ SOLN
0.0000 [IU] | Freq: Three times a day (TID) | SUBCUTANEOUS | Status: DC
  Administered 2014-04-13 – 2014-04-14 (×2): 3 [IU] via SUBCUTANEOUS
  Administered 2014-04-15: 12:00:00 2 [IU] via SUBCUTANEOUS
  Administered 2014-04-15: 19:00:00 8 [IU] via SUBCUTANEOUS

## 2014-04-13 MED ORDER — ASPIRIN EC 81 MG PO TBEC
81.0000 mg | DELAYED_RELEASE_TABLET | Freq: Every day | ORAL | Status: DC
  Administered 2014-04-14 – 2014-04-16 (×3): 81 mg via ORAL
  Filled 2014-04-13 (×4): qty 1

## 2014-04-13 MED ORDER — LEVOTHYROXINE SODIUM 75 MCG PO TABS
75.0000 ug | ORAL_TABLET | Freq: Every day | ORAL | Status: DC
  Administered 2014-04-14 – 2014-04-16 (×3): 75 ug via ORAL
  Filled 2014-04-13 (×5): qty 1

## 2014-04-13 MED ORDER — LISINOPRIL 20 MG PO TABS
20.0000 mg | ORAL_TABLET | Freq: Every day | ORAL | Status: DC
  Filled 2014-04-13: qty 1

## 2014-04-13 MED ORDER — ACETAMINOPHEN 325 MG PO TABS
650.0000 mg | ORAL_TABLET | ORAL | Status: DC | PRN
  Administered 2014-04-14 – 2014-04-15 (×2): 650 mg via ORAL
  Filled 2014-04-13 (×3): qty 2

## 2014-04-13 MED ORDER — GLIPIZIDE ER 10 MG PO TB24
10.0000 mg | ORAL_TABLET | Freq: Every day | ORAL | Status: DC
  Administered 2014-04-14 – 2014-04-16 (×3): 10 mg via ORAL
  Filled 2014-04-13 (×7): qty 1

## 2014-04-13 MED ORDER — ATORVASTATIN CALCIUM 40 MG PO TABS
40.0000 mg | ORAL_TABLET | Freq: Every day | ORAL | Status: DC
  Administered 2014-04-13 – 2014-04-14 (×2): 40 mg via ORAL
  Filled 2014-04-13 (×2): qty 1

## 2014-04-13 MED ORDER — SODIUM CHLORIDE 0.9 % IJ SOLN: 3.0000 mL | INTRAMUSCULAR | Status: DC | PRN

## 2014-04-13 MED ORDER — FENOFIBRATE 160 MG PO TABS
160.0000 mg | ORAL_TABLET | Freq: Every day | ORAL | Status: DC
  Administered 2014-04-14 – 2014-04-16 (×3): 160 mg via ORAL
  Filled 2014-04-13 (×3): qty 1

## 2014-04-13 MED ORDER — HEPARIN BOLUS VIA INFUSION
4000.0000 [IU] | Freq: Once | INTRAVENOUS | Status: DC
  Filled 2014-04-13: qty 4000

## 2014-04-13 MED ORDER — INSULIN GLARGINE 100 UNIT/ML SOLOSTAR PEN: 10.0000 [IU] | PEN_INJECTOR | Freq: Every day | SUBCUTANEOUS | Status: DC

## 2014-04-13 MED ORDER — ENOXAPARIN SODIUM 100 MG/ML ~~LOC~~ SOLN
100.0000 mg | Freq: Two times a day (BID) | SUBCUTANEOUS | Status: DC
  Administered 2014-04-13 – 2014-04-14 (×3): 100 mg via SUBCUTANEOUS
  Filled 2014-04-13 (×6): qty 1

## 2014-04-13 MED ORDER — CARVEDILOL 12.5 MG PO TABS
12.5000 mg | ORAL_TABLET | Freq: Two times a day (BID) | ORAL | Status: DC
  Administered 2014-04-13 – 2014-04-16 (×6): 12.5 mg via ORAL
  Filled 2014-04-13 (×6): qty 1

## 2014-04-13 MED ORDER — PANTOPRAZOLE SODIUM 40 MG PO TBEC
40.0000 mg | DELAYED_RELEASE_TABLET | Freq: Two times a day (BID) | ORAL | Status: DC
  Administered 2014-04-13 – 2014-04-16 (×6): 40 mg via ORAL
  Filled 2014-04-13 (×5): qty 1

## 2014-04-13 MED ORDER — NIACIN 100 MG PO TABS
100.0000 mg | ORAL_TABLET | Freq: Every day | ORAL | Status: DC
  Administered 2014-04-14 – 2014-04-16 (×3): 100 mg via ORAL
  Filled 2014-04-13 (×4): qty 1

## 2014-04-13 MED ORDER — LISINOPRIL-HYDROCHLOROTHIAZIDE 20-25 MG PO TABS: 1.0000 | ORAL_TABLET | Freq: Every day | ORAL | Status: DC

## 2014-04-13 MED ORDER — NITROGLYCERIN 0.4 MG SL SUBL: 0.4000 mg | SUBLINGUAL_TABLET | SUBLINGUAL | Status: DC | PRN

## 2014-04-13 MED ORDER — HEPARIN (PORCINE) IN NACL 100-0.45 UNIT/ML-% IJ SOLN
1200.0000 [IU]/h | INTRAMUSCULAR | Status: DC
  Filled 2014-04-13 (×2): qty 250

## 2014-04-13 MED ORDER — INSULIN GLARGINE 100 UNIT/ML ~~LOC~~ SOLN
13.0000 [IU] | Freq: Every day | SUBCUTANEOUS | Status: DC
  Administered 2014-04-14 – 2014-04-16 (×3): 13 [IU] via SUBCUTANEOUS
  Filled 2014-04-13 (×4): qty 0.13

## 2014-04-13 MED ORDER — METFORMIN HCL 500 MG PO TABS
1000.0000 mg | ORAL_TABLET | Freq: Two times a day (BID) | ORAL | Status: DC
  Administered 2014-04-14 (×2): 1000 mg via ORAL
  Filled 2014-04-13 (×7): qty 2

## 2014-04-13 MED ORDER — VITAMIN B-12 100 MCG PO TABS
50.0000 ug | ORAL_TABLET | Freq: Every day | ORAL | Status: DC
  Administered 2014-04-14 – 2014-04-16 (×3): 50 ug via ORAL
  Filled 2014-04-13 (×4): qty 1

## 2014-04-13 NOTE — ED Notes (Signed)
Pt reports that he started feeling bad this morning about 0500. Pt reports that he started having chest pain and shoulder pain. Pt reports he took 325 asa this morning and it helped the pain. States that the asa had worn off and it still having pain. Pt denies any SOB or n/v.

## 2014-04-13 NOTE — H&P (Signed)
Patrick Duran is an 69 y.o. male.   Chief Complaint: Chest pain HPI: Patient is a 69 year old Caucasian male with history of known coronary artery disease, history of placement of 3 stents, details not on sometime in 2010, hypertension, hyperlipidemia who presented to the emergency room after he woke up with chest discomfort, associated with radiation to the neck and also to his arms and diaphoresis.  Due to his multiple cardiovascular risk factors and his symptoms suggestive of unstable angina pectoris he is now admitted to the hospital for further management and evaluation.  Since admission to the hospital, patient has not had any further chest discomfort or arm discomfort.  His wife is present at the bedside.  Denies any symptoms of TIA or claudication.  No leg edema.  He quit smoking many years ago and is remained abstinent no recent weight changes.  Past Medical History  Diagnosis Date  . Arthritis   . GERD (gastroesophageal reflux disease)   . Diabetes mellitus   . Hyperlipidemia   . Hypertension   . Coronary artery disease   . Vertigo   . Hearing loss     bilateral  . Trouble swallowing   . Easy bruising     Past Surgical History  Procedure Laterality Date  . Cholecystectomy    . Cardiac catheterization  2000  . Spine surgery  2004    cervical fusion    Family History  Problem Relation Age of Onset  . Diabetes Mother   . Hypothyroidism Mother    Social History:  reports that he has quit smoking. He has never used smokeless tobacco. He reports that he drinks alcohol. He reports that he does not use illicit drugs.  Allergies:  Allergies  Allergen Reactions  . Codeine Sulfate     REACTION: nausea  . Eggs Or Egg-Derived Products   . Hydrocodone     nausea  . Peanut-Containing Drug Products     And all nuts  . Percocet [Oxycodone-Acetaminophen]     nausea     Review of Systems - Negative except diabetes mellitus, has mild GERD, no recent weight changes.  No  bowel or bladder disturbances.  Blood pressure 126/73, pulse 63, temperature 97.4 F (36.3 C), resp. rate 19, SpO2 98 %.  Well build and mildly obese habitus who is in no acute distress. Appears stated age. Alert Ox3.   There is no cyanosis. HEENT: normal limits.No JVD.   CARDIAC EXAM: S1 S2 normal, no gallop, II/VI SEM noted in apex and right sternal border. Conducted to the carotids.  CHEST EXAM: No Tendernes. LUNGS: Clear to percuss and auscultate.  ABDOMEN: No hepatosplenomegaly. BS normal in all 4 quadrants. Abdomen is non-tender.  EXTREMITY: Warm, non tender trace bilateral pitting edema bilateral below knee and ankle.   MUSCULOSKELETAL EXAM: Intact with full range of motion in all 4 extremities.   NEUROLOGIC EXAM: Grossly intact without any focal deficits. Alert O x 3.   VASCULAR EXAM: No skin breakdown. Carotids normal amplitude with soft bruit. Extremities: Femoral pulse normal. Popliteal pulse normal ; Pedal pulse normal. Otherwise No prominent pulse felt in the abdomen. No varicose veins.  Results for orders placed or performed during the hospital encounter of 04/13/14 (from the past 48 hour(s))  CBC     Status: None   Collection Time: 04/13/14 11:50 AM  Result Value Ref Range   WBC 5.9 4.0 - 10.5 K/uL   RBC 4.43 4.22 - 5.81 MIL/uL   Hemoglobin 13.8 13.0 -  17.0 g/dL   HCT 39.9 39.0 - 52.0 %   MCV 90.1 78.0 - 100.0 fL   MCH 31.2 26.0 - 34.0 pg   MCHC 34.6 30.0 - 36.0 g/dL   RDW 12.6 11.5 - 15.5 %   Platelets 227 150 - 400 K/uL  Basic metabolic panel     Status: Abnormal   Collection Time: 04/13/14 11:50 AM  Result Value Ref Range   Sodium 137 135 - 145 mmol/L    Comment: Please note change in reference range.   Potassium 4.2 3.5 - 5.1 mmol/L    Comment: Please note change in reference range.   Chloride 102 96 - 112 mEq/L   CO2 25 19 - 32 mmol/L   Glucose, Bld 278 (H) 70 - 99 mg/dL   BUN 20 6 - 23 mg/dL   Creatinine, Ser 1.40 (H) 0.50 - 1.35  mg/dL   Calcium 9.8 8.4 - 10.5 mg/dL   GFR calc non Af Amer 50 (L) >90 mL/min   GFR calc Af Amer 59 (L) >90 mL/min    Comment: (NOTE) The eGFR has been calculated using the CKD EPI equation. This calculation has not been validated in all clinical situations. eGFR's persistently <90 mL/min signify possible Chronic Kidney Disease.    Anion gap 10 5 - 15  I-stat troponin, ED (not at [facility])     Status: None   Collection Time: 04/13/14 12:06 PM  Result Value Ref Range   Troponin i, poc 0.07 0.00 - 0.08 ng/mL   Comment 3            Comment: Due to the release kinetics of cTnI, a negative result within the first hours of the onset of symptoms does not rule out myocardial infarction with certainty. If myocardial infarction is still suspected, repeat the test at appropriate intervals.   CBG monitoring, ED     Status: Abnormal   Collection Time: 04/13/14  4:03 PM  Result Value Ref Range   Glucose-Capillary 201 (H) 70 - 99 mg/dL   Comment 1 Documented in Chart    Comment 2 Notify RN    Dg Chest 2 View  04/13/2014   CLINICAL DATA:  69 year old male with shortness of breath, chest pain, nausea and dizziness  EXAM: CHEST  2 VIEW  COMPARISON:  Prior chest x-ray 02/09/2011  FINDINGS: Stable cardiac and mediastinal contours. Atherosclerotic calcifications noted in the tortuous aorta. Similar appearance of a prominent epicardial fat pad versus scarring in the inferior lingula. No focal airspace consolidation, pulmonary edema, pleural effusion or pneumothorax. Stable mild central bronchitic change. Incompletely imaged anterior cervical fusion hardware with screw fractures. No acute osseous abnormality.  IMPRESSION: Stable chest x-ray without evidence of acute cardiopulmonary process.   Electronically Signed   By: Jacqulynn Cadet M.D.   On: 04/13/2014 14:02    Labs:   Lab Results  Component Value Date   WBC 5.9 04/13/2014   HGB 13.8 04/13/2014   HCT 39.9 04/13/2014   MCV 90.1 04/13/2014   PLT  227 04/13/2014    Recent Labs Lab 04/13/14 1150  NA 137  K 4.2  CL 102  CO2 25  BUN 20  CREATININE 1.40*  CALCIUM 9.8  GLUCOSE 278*   No results found for: CKTOTAL, CKMB, CKMBINDEX, TROPONINI  Lipid Panel     Component Value Date/Time   CHOL 171 10/12/2007 0906   TRIG 203* 10/12/2007 0906   HDL 22.4* 10/12/2007 0906   CHOLHDL 7.6 CALC 10/12/2007 0906  VLDL 41* 10/12/2007 0906   LDLCALC 130* 12/12/2006 1210    EKG: S. Bradycardia. Non specific T change.  Assessment/Plan 1. Chest pain very suggestive of unstable angina pectoris. 2. Coronary atherosclerosis of native coronary artery H/O Coronary stenting in 2000. No details available.  Outpatient work-up included:  Echo 01/04/11: Normal LVEF. Mild diastolic dysfunction. Moderate LVH and left atrial enlargement.  Lexiscan stress 01/15/11: Mild diaphragmatic attenuation artifact. EF 47%. Visually normal. Low risk. Impression: EKG 09/14/2013: Sinus bradycardia at the rate of 58 bpm, normal intervals, no evidence of ischemia. Normal EKG. No change from 09/14/2012  3. CKD stage 3 due to type 2 diabetes mellitus  4. Hypertension 5. Hyperlipidemia-mixed variety. 6. DM-2 Uncontrolled with renal manifestation. 7. History suggestive of obstructive sleep apnea.  Recommendation: Patient's symptoms are suggestive of unstable angina pectoris, I have discussed with him and his wife at the bedside that we should probably proceed with coronary angiography.  I have discussed with him regarding stress testing versus angiography, patient willing to proceed. Schedule for cardiac catheterization, and possible angioplasty. We discussed regarding risks, benefits, alternatives to this including stress testing, CTA and continued medical therapy. Patient wants to proceed. Understands <1-2% risk of death, stroke, MI, urgent CABG, bleeding, infection, renal failure but not limited to these.  Laverda Page, MD 04/13/2014, 4:08 PM [place] [facility] [place] Pager: [phone] Office: [phone] If no answer: Cell:  [phone]

## 2014-04-13 NOTE — Progress Notes (Addendum)
ANTICOAGULATION CONSULT NOTE - Initial Consult  Pharmacy Consult for heparin  Indication: chest pain/ACS  Allergies  Allergen Reactions  . Codeine Sulfate     REACTION: nausea  . Eggs Or Egg-Derived Products   . Hydrocodone     nausea  . Peanut-Containing Drug Products     And all nuts  . Percocet [Oxycodone-Acetaminophen]     nausea    Patient Measurements:   Heparin Dosing Weight: 101.3 kg  Vital Signs: Temp: 97.4 F (36.3 C) (01/16 1108) BP: 126/73 mmHg (01/16 1500) Pulse Rate: 63 (01/16 1500)  Labs:  Recent Labs  04/13/14 1150 04/13/14 1432  HGB 13.8  --   HCT 39.9  --   PLT 227  --   CREATININE 1.40*  --   TROPONINI  --  0.11*    CrCl cannot be calculated (Unknown ideal weight.).   Medical History: Past Medical History  Diagnosis Date  . Arthritis   . GERD (gastroesophageal reflux disease)   . Diabetes mellitus   . Hyperlipidemia   . Hypertension   . Coronary artery disease   . Vertigo   . Hearing loss     bilateral  . Trouble swallowing   . Easy bruising     Medications:   (Not in a hospital admission)  Assessment: Admitted with chest and shoulder pain. Trop (+), concern for ACS. CBC stable and wnl, no anticoagulants PTA.  Goal of Therapy:  Heparin level 0.3-0.7 units/ml Monitor platelets by anticoagulation protocol: Yes   Plan:  Heparin 4000 units x1 then 1200 units/hr Daily HL, CBC Check HL at 2230   Agapito Games, PharmD, BCPS Clinical Pharmacist Pager: [phone] 04/13/2014 4:19 PM    Addendum  -Changing to Lovenox -Lovenox 100 mg [place] q12h  Baldemar Friday 04/13/2014 6:04 PM

## 2014-04-13 NOTE — ED Provider Notes (Signed)
CSN: [id number]     Arrival date & time 04/13/14  1102 History   First MD Initiated Contact with Patient 04/13/14 1149     Chief Complaint  Patient presents with  . Chest Pain  . Dizziness     (Consider location/radiation/quality/duration/timing/severity/associated sxs/prior Treatment) Patient is a 69 y.o. male presenting with chest pain and dizziness.  Chest Pain Pain location:  R chest Pain quality comment:  Pt has difficulty qualifying pain Pain radiates to:  R jaw Pain severity:  Moderate Onset quality:  Sudden Duration:  8 hours Timing:  Constant Progression:  Waxing and waning Chronicity:  Recurrent Context comment:  Gets similar pain once a month or so, usually resolves with aspirin Relieved by:  Nothing Worsened by:  Nothing tried Associated symptoms: diaphoresis, dizziness, nausea, shortness of breath and vomiting   Dizziness Associated symptoms: chest pain, nausea, shortness of breath and vomiting     Past Medical History  Diagnosis Date  . Arthritis   . GERD (gastroesophageal reflux disease)   . Diabetes mellitus   . Hyperlipidemia   . Hypertension   . Coronary artery disease   . Vertigo   . Hearing loss     bilateral  . Trouble swallowing   . Easy bruising    Past Surgical History  Procedure Laterality Date  . Cholecystectomy    . Cardiac catheterization  2000  . Spine surgery  2004    cervical fusion   Family History  Problem Relation Age of Onset  . Diabetes Mother   . Hypothyroidism Mother    History  Substance Use Topics  . Smoking status: Former Games developer  . Smokeless tobacco: Never Used  . Alcohol Use: Yes    Review of Systems  Constitutional: Positive for diaphoresis.  Respiratory: Positive for shortness of breath.   Cardiovascular: Positive for chest pain.  Gastrointestinal: Positive for nausea and vomiting.  Neurological: Positive for dizziness.  All other systems reviewed and are negative.     Allergies  Codeine sulfate;  Eggs or egg-derived products; Hydrocodone; Peanut-containing drug products; and Percocet  Home Medications   Prior to Admission medications   Medication Sig Start Date End Date Taking? Authorizing Provider  acetaminophen (TYLENOL) 500 MG tablet Take 500 mg by mouth every 6 (six) hours as needed. For pain    Historical Provider, MD  Alpha-Lipoic Acid 300 MG CAPS Take by mouth daily.      Historical Provider, MD  aspirin 325 MG tablet Take 325 mg by mouth daily.      Historical Provider, MD  atorvastatin (LIPITOR) 40 MG tablet Take 40 mg by mouth daily.  12/04/10   Historical Provider, MD  benzonatate (TESSALON) 100 MG capsule Take 100 mg by mouth 3 (three) times daily as needed. For couth     Historical Provider, MD  co-enzyme Q-10 30 MG capsule Take 120 mg by mouth daily.      Historical Provider, MD  cyclobenzaprine (FLEXERIL) 10 MG tablet Take 10 mg by mouth 2 (two) times daily as needed. For spams    Historical Provider, MD  diazepam (VALIUM) 5 MG tablet Take 5 mg by mouth every 6 (six) hours as needed. For anxiety    Historical Provider, MD  fish oil-omega-3 fatty acids 1000 MG capsule Take 2 g by mouth daily.      Historical Provider, MD  Flaxseed, Linseed, (FLAXSEED OIL PO) Take by mouth daily.      Historical Provider, MD  Grape Seed OIL Take by mouth  daily.     Historical Provider, MD  ibuprofen (ADVIL,MOTRIN) 200 MG tablet Take 200 mg by mouth every 6 (six) hours as needed. For pain    Historical Provider, MD  Inositol Niacinate (NIACIN FLUSH FREE) 500 MG CAPS Take by mouth daily.      Historical Provider, MD  metFORMIN (GLUCOPHAGE) 1000 MG tablet Take 1,000 mg by mouth 2 (two) times daily with a meal.      Historical Provider, MD  Multiple Vitamin (MULTIVITAMIN PO) Take by mouth daily.      Historical Provider, MD  Multiple Vitamins-Minerals (ZINC PO) Take by mouth daily.      Historical Provider, MD  pantoprazole (PROTONIX) 40 MG tablet Take 40 mg by mouth 2 (two) times daily.  12/12/10    Historical Provider, MD  POTASSIUM GLUCONATE PO Take by mouth daily.      Historical Provider, MD  PREDNISONE PO Take 10 tablets by mouth daily.     Historical Provider, MD  sildenafil (VIAGRA) 100 MG tablet Take 100 mg by mouth daily as needed. For erectile dysfunction    Historical Provider, MD  sucralfate (CARAFATE) 1 G tablet Take 1 tablet (1 g total) by mouth 4 (four) times daily. 02/09/11 02/09/12  Bethany Hunt, PA-C  vitamin B-12 (CYANOCOBALAMIN) 100 MCG tablet Take 50 mcg by mouth daily.      Historical Provider, MD   BP 146/82 mmHg  Pulse 54  Temp(Src) 97.4 F (36.3 C)  Resp 16  SpO2 99% Physical Exam  Constitutional: He is oriented to person, place, and time. He appears well-developed and well-nourished. No distress.  Pale  HENT:  Head: Normocephalic and atraumatic.  Mouth/Throat: Oropharynx is clear and moist.  Eyes: Conjunctivae are normal. Pupils are equal, round, and reactive to light. No scleral icterus.  Neck: Neck supple.  Cardiovascular: Normal rate, regular rhythm, normal heart sounds and intact distal pulses.   No murmur heard. Pulmonary/Chest: Effort normal and breath sounds normal. No stridor. No respiratory distress. He has no wheezes. He has no rales.  Abdominal: Soft. He exhibits no distension. There is no tenderness.  Musculoskeletal: Normal range of motion. He exhibits no edema.  Neurological: He is alert and oriented to person, place, and time.  Skin: Skin is warm and dry. No rash noted.  Psychiatric: He has a normal mood and affect. His behavior is normal.  Nursing note and vitals reviewed.   ED Course  Procedures (including critical care time) Labs Review Labs Reviewed  BASIC METABOLIC PANEL - Abnormal; Notable for the following:    Glucose, Bld 278 (*)    Creatinine, Ser 1.40 (*)    GFR calc non Af Amer 50 (*)    GFR calc Af Amer 59 (*)    All other components within normal limits  CBC  TROPONIN I  HEPATIC FUNCTION PANEL  I-STAT TROPOININ,  ED    Imaging Review Dg Chest 2 View  04/13/2014   CLINICAL DATA:  69 year old male with shortness of breath, chest pain, nausea and dizziness  EXAM: CHEST  2 VIEW  COMPARISON:  Prior chest x-ray 02/09/2011  FINDINGS: Stable cardiac and mediastinal contours. Atherosclerotic calcifications noted in the tortuous aorta. Similar appearance of a prominent epicardial fat pad versus scarring in the inferior lingula. No focal airspace consolidation, pulmonary edema, pleural effusion or pneumothorax. Stable mild central bronchitic change. Incompletely imaged anterior cervical fusion hardware with screw fractures. No acute osseous abnormality.  IMPRESSION: Stable chest x-ray without evidence of acute cardiopulmonary process.  Electronically Signed   By: Malachy Moan M.D.   On: 04/13/2014 14:02  All radiology studies independently viewed by me.      EKG Interpretation   Date/Time:  Saturday April 13 2014 11:08:27 EST Ventricular Rate:  55 PR Interval:  154 QRS Duration: 80 QT Interval:  370 QTC Calculation: 353 R Axis:   -7 Text Interpretation:  Sinus bradycardia Otherwise normal ECG No  significant change was found Confirmed by [facility]  MD, TREY ([id number]) on  04/13/2014 11:50:02 AM      MDM   Final diagnoses:  Unstable angina    69 yo male with hx of CAD presenting with right sided chest pain, diaphoresis, shortness of breath, and vomiting.  His initial ED workup was reassuring and his symptoms were controlled.  However, do to his history of CAD, I think he needs to be admitted for further chest pain workup.  Dr. Jacinto Halim will admit.    Second troponin slightly elevated.  Will start heparin.    Candyce Churn III, MD 04/14/14 [phone]

## 2014-04-14 LAB — PROTIME-INR
INR: 1.09 (ref 0.00–1.49)
Prothrombin Time: 14.2 seconds (ref 11.6–15.2)

## 2014-04-14 LAB — GLUCOSE, CAPILLARY
Glucose-Capillary: 121 mg/dL — ABNORMAL HIGH (ref 70–99)
Glucose-Capillary: 232 mg/dL — ABNORMAL HIGH (ref 70–99)
Glucose-Capillary: 152 mg/dL — ABNORMAL HIGH (ref 70–99)
Glucose-Capillary: 140 mg/dL — ABNORMAL HIGH (ref 70–99)

## 2014-04-14 LAB — BASIC METABOLIC PANEL
Anion gap: 8 (ref 5–15)
BUN: 22 mg/dL (ref 6–23)
CO2: 27 mmol/L (ref 19–32)
Calcium: 9.5 mg/dL (ref 8.4–10.5)
Chloride: 103 mEq/L (ref 96–112)
Creatinine, Ser: 1.37 mg/dL — ABNORMAL HIGH (ref 0.50–1.35)
GFR calc Af Amer: 60 mL/min — ABNORMAL LOW (ref >90)
GFR calc non Af Amer: 52 mL/min — ABNORMAL LOW (ref >90)
Glucose, Bld: 127 mg/dL — ABNORMAL HIGH (ref 70–99)
Potassium: 3.3 mmol/L — ABNORMAL LOW (ref 3.5–5.1)
Sodium: 138 mmol/L (ref 135–145)

## 2014-04-14 LAB — CBC
HCT: 36.8 % — ABNORMAL LOW (ref 39.0–52.0)
Hemoglobin: 12.7 g/dL — ABNORMAL LOW (ref 13.0–17.0)
MCH: 31.5 pg (ref 26.0–34.0)
MCHC: 34.5 g/dL (ref 30.0–36.0)
MCV: 91.3 fL (ref 78.0–100.0)
Platelets: 198 10*3/uL (ref 150–400)
RBC: 4.03 MIL/uL — ABNORMAL LOW (ref 4.22–5.81)
RDW: 12.9 % (ref 11.5–15.5)
WBC: 5.9 10*3/uL (ref 4.0–10.5)

## 2014-04-14 LAB — LIPID PANEL
Cholesterol: 167 mg/dL (ref 0–200)
HDL: 23 mg/dL — ABNORMAL LOW (ref >39)
LDL Cholesterol: 76 mg/dL (ref 0–99)
Total CHOL/HDL Ratio: 7.3 RATIO
Triglycerides: 341 mg/dL — ABNORMAL HIGH (ref <150)
VLDL: 68 mg/dL — ABNORMAL HIGH (ref 0–40)

## 2014-04-14 LAB — TROPONIN I: Troponin I: 0.15 ng/mL — ABNORMAL HIGH (ref <0.031)

## 2014-04-14 MED ORDER — OMEGA-3-ACID ETHYL ESTERS 1 G PO CAPS
2.0000 g | ORAL_CAPSULE | Freq: Every day | ORAL | Status: DC
  Administered 2014-04-14: 2 g via ORAL
  Administered 2014-04-15: 12:00:00 1 g via ORAL
  Administered 2014-04-16: 09:00:00 2 g via ORAL
  Filled 2014-04-14 (×3): qty 2

## 2014-04-14 MED ORDER — SODIUM CHLORIDE 0.9 % IJ SOLN
3.0000 mL | Freq: Two times a day (BID) | INTRAMUSCULAR | Status: DC
Start: 2014-04-14 — End: 2014-04-15
  Administered 2014-04-14: 3 mL via INTRAVENOUS

## 2014-04-14 MED ORDER — SODIUM CHLORIDE 0.9 % IV SOLN: INTRAVENOUS | Status: DC

## 2014-04-14 MED ORDER — SODIUM CHLORIDE 0.9 % IJ SOLN: 3.0000 mL | INTRAMUSCULAR | Status: DC | PRN

## 2014-04-14 MED ORDER — ATORVASTATIN CALCIUM 80 MG PO TABS
80.0000 mg | ORAL_TABLET | Freq: Every day | ORAL | Status: DC
  Administered 2014-04-15: 19:00:00 80 mg via ORAL
  Filled 2014-04-14 (×3): qty 1

## 2014-04-14 MED ORDER — SODIUM CHLORIDE 0.9 % IV SOLN: 250.0000 mL | INTRAVENOUS | Status: DC | PRN

## 2014-04-14 NOTE — Progress Notes (Signed)
Subjective:  No further chest pain  Objective:  Vital Signs in the last 24 hours: Temp:  [97.8 F (36.6 C)-98.3 F (36.8 C)] 98 F (36.7 C) (01/17 1700) Pulse Rate:  [54-73] 61 (01/17 1700) Resp:  [18-20] 18 (01/17 1700) BP: (101-131)/(42-75) 131/74 mmHg (01/17 1700) SpO2:  [97 %-100 %] 97 % (01/17 1700) Weight:  [105.238 kg (232 lb 0.1 oz)] 105.238 kg (232 lb 0.1 oz) (01/17 0400)  Intake/Output from previous day: 01/16 0701 - 01/17 0700 In: 390.5 [P.O.:360; I.V.:20.5] Out: -   Physical Exam: Well build and mildly obese habitus who is in no acute distress. Appears stated age. Alert Ox3.   There is no cyanosis. HEENT: normal limits.No JVD.   CARDIAC EXAM: S1 S2 normal, no gallop, II/VI SEM noted in apex and right sternal border. Conducted to the carotids.  CHEST EXAM: No Tendernes. LUNGS: Clear to percuss and auscultate.  ABDOMEN: No hepatosplenomegaly. BS normal in all 4 quadrants. Abdomen is non-tender.  EXTREMITY: Warm, non tender trace bilateral pitting edema bilateral below knee and ankle.   MUSCULOSKELETAL EXAM: Intact with full range of motion in all 4 extremities.   NEUROLOGIC EXAM: Grossly intact without any focal deficits. Alert O x 3.   VASCULAR EXAM: No skin breakdown. Carotids normal amplitude with soft bruit. Extremities: Femoral pulse normal. Popliteal pulse normal ; Pedal pulse normal. Otherwise No prominent pulse felt in the abdomen. No varicose veins.   Lab Results: BMP  Recent Labs  04/13/14 1150 04/14/14 0429  NA 137 138  K 4.2 3.3*  CL 102 103  CO2 25 27  GLUCOSE 278* 127*  BUN 20 22  CREATININE 1.40* 1.37*  CALCIUM 9.8 9.5  GFRNONAA 50* 52*  GFRAA 59* 60*    CBC  Recent Labs Lab 04/14/14 0500  WBC 5.9  RBC 4.03*  HGB 12.7*  HCT 36.8*  PLT 198  MCV 91.3  MCH 31.5  MCHC 34.5  RDW 12.9    HEMOGLOBIN A1C Lab Results  Component Value Date   HGBA1C 7.0* 04/13/2014   MPG 154* 04/13/2014    Cardiac Panel  (last 3 results)  Recent Labs  04/13/14 1650 04/13/14 2200 04/14/14 0429  TROPONINI 0.13* 0.20* 0.15*   Lipid Panel     Component Value Date/Time   CHOL 167 04/14/2014 0429   TRIG 341* 04/14/2014 0429   HDL 23* 04/14/2014 0429   CHOLHDL 7.3 04/14/2014 0429   VLDL 68* 04/14/2014 0429   LDLCALC 76 04/14/2014 0429   LDLDIRECT 106.6 10/12/2007 0906     Hepatic Function Panel  Recent Labs  04/13/14 1432  PROT 6.7  ALBUMIN 3.9  AST 32  ALT 42  ALKPHOS 104  BILITOT 1.0  BILIDIR 0.1  IBILI 0.9    Cardiac Studies:       Component Value Date/Time   CHOL 171 10/12/2007 0906   TRIG 203* 10/12/2007 0906   HDL 22.4* 10/12/2007 0906   CHOLHDL 7.6 CALC 10/12/2007 0906   VLDL 41* 10/12/2007 0906   LDLCALC 130* 12/12/2006 1210      EKG: S. Bradycardia. Non specific T change. No ischemia. No change from 04/13/2014  Scheduled Meds: . aspirin EC  81 mg Oral Daily  . atorvastatin  40 mg Oral Daily  . carvedilol  12.5 mg Oral BID WC  . enoxaparin (LOVENOX) injection  100 mg Subcutaneous Q12H  . fenofibrate  160 mg Oral Daily  . glipiZIDE  10 mg Oral Q breakfast  . insulin aspart  0-15  Units Subcutaneous TID WC  . insulin glargine  13 Units Subcutaneous Daily  . levothyroxine  75 mcg Oral QAC breakfast  . lisinopril  20 mg Oral Daily  . metFORMIN  1,000 mg Oral BID WC  . niacin  100 mg Oral Daily  . omega-3 acid ethyl esters  2 g Oral Daily  . pantoprazole  40 mg Oral BID  . sodium chloride  3 mL Intravenous Q12H  . sodium chloride  3 mL Intravenous Q12H  . vitamin B-12  50 mcg Oral Daily   Continuous Infusions: . [START ON 04/15/2014] sodium chloride    . nitroGLYCERIN 5 mcg/min (04/13/14 1822)   PRN Meds:.sodium chloride, sodium chloride, acetaminophen, nitroGLYCERIN, ondansetron (ZOFRAN) IV, sodium chloride, sodium chloride    Assessment/Plan:  1. NSTEMI 2. Coronary atherosclerosis of native coronary artery H/O Coronary stenting  in 2000. No details available.  Outpatient work-up included:  Echo 01/04/11: Normal LVEF. Mild diastolic dysfunction. Moderate LVH and left atrial enlargement.  Lexiscan stress 01/15/11: Mild diaphragmatic attenuation artifact. EF 47%. Visually normal. Low risk. Impression: EKG 09/14/2013: Sinus bradycardia at the rate of 58 bpm, normal intervals, no evidence of ischemia. Normal EKG. No change from 09/14/2012  3. CKD stage 3 due to type 2 diabetes mellitus  4. Hypertension 5. Hyperlipidemia-mixed variety. 6. DM-2 Uncontrolled with renal manifestation.  Rec: Patient is agreeable to proceed with coronary angiogram. Wife present at bedside and discussed again the risks and benefits. Needs better control of DM and triglycerides. Hold Lisinopril until cath done to decrease renal complications.   Pamella Pert, M.D. 04/14/2014, 6:04 PM [facility] Pager: [phone] Office: [phone] If no answer: [phone]

## 2014-04-15 ENCOUNTER — Encounter (HOSPITAL_COMMUNITY)
Admission: EM | Disposition: A | Discharge status: Home / Self Care | Payer: 59 | Source: Home / Self Care | Attending: Cardiology

## 2014-04-15 HISTORY — PX: LEFT HEART CATHETERIZATION WITH CORONARY ANGIOGRAM: SHX5451

## 2014-04-15 HISTORY — PX: CARDIAC CATHETERIZATION: SHX172

## 2014-04-15 LAB — BASIC METABOLIC PANEL
Anion gap: 10 (ref 5–15)
BUN: 20 mg/dL (ref 6–23)
CO2: 26 mmol/L (ref 19–32)
Calcium: 9.5 mg/dL (ref 8.4–10.5)
Chloride: 102 mEq/L (ref 96–112)
Creatinine, Ser: 1.35 mg/dL (ref 0.50–1.35)
GFR calc Af Amer: 61 mL/min — ABNORMAL LOW (ref >90)
GFR calc non Af Amer: 53 mL/min — ABNORMAL LOW (ref >90)
Glucose, Bld: 95 mg/dL (ref 70–99)
Potassium: 3.3 mmol/L — ABNORMAL LOW (ref 3.5–5.1)
Sodium: 138 mmol/L (ref 135–145)

## 2014-04-15 LAB — POCT ACTIVATED CLOTTING TIME
Activated Clotting Time: 196 seconds
Activated Clotting Time: 503 seconds
Activated Clotting Time: 454 seconds

## 2014-04-15 LAB — GLUCOSE, CAPILLARY
Glucose-Capillary: 146 mg/dL — ABNORMAL HIGH (ref 70–99)
Glucose-Capillary: 259 mg/dL — ABNORMAL HIGH (ref 70–99)
Glucose-Capillary: 171 mg/dL — ABNORMAL HIGH (ref 70–99)

## 2014-04-15 SURGERY — LEFT HEART CATHETERIZATION WITH CORONARY ANGIOGRAM
Anesthesia: LOCAL

## 2014-04-15 MED ORDER — SODIUM CHLORIDE 0.9 % IV SOLN: 250.0000 mL | INTRAVENOUS | Status: DC | PRN

## 2014-04-15 MED ORDER — VERAPAMIL HCL 2.5 MG/ML IV SOLN
INTRAVENOUS | Status: AC
  Filled 2014-04-15: qty 2

## 2014-04-15 MED ORDER — POTASSIUM CHLORIDE CRYS ER 20 MEQ PO TBCR
EXTENDED_RELEASE_TABLET | ORAL | Status: AC
  Filled 2014-04-15: qty 1

## 2014-04-15 MED ORDER — HEPARIN SODIUM (PORCINE) 1000 UNIT/ML IJ SOLN
INTRAMUSCULAR | Status: AC
  Filled 2014-04-15: qty 1

## 2014-04-15 MED ORDER — DIAZEPAM 5 MG PO TABS
5.0000 mg | ORAL_TABLET | Freq: Once | ORAL | Status: AC
  Administered 2014-04-15: 5 mg via ORAL

## 2014-04-15 MED ORDER — ACTIVE PARTNERSHIP FOR HEALTH OF YOUR HEART BOOK
Freq: Once | Status: DC
  Filled 2014-04-15: qty 1

## 2014-04-15 MED ORDER — FENTANYL CITRATE 0.05 MG/ML IJ SOLN
INTRAMUSCULAR | Status: AC
  Filled 2014-04-15: qty 2

## 2014-04-15 MED ORDER — SODIUM CHLORIDE 0.9 % IJ SOLN: 3.0000 mL | INTRAMUSCULAR | Status: DC | PRN

## 2014-04-15 MED ORDER — TICAGRELOR 90 MG PO TABS
90.0000 mg | ORAL_TABLET | Freq: Two times a day (BID) | ORAL | Status: DC
  Administered 2014-04-15 – 2014-04-16 (×2): 90 mg via ORAL
  Filled 2014-04-15 (×5): qty 1

## 2014-04-15 MED ORDER — BIVALIRUDIN 250 MG IV SOLR
INTRAVENOUS | Status: AC
  Filled 2014-04-15: qty 250

## 2014-04-15 MED ORDER — DIAZEPAM 5 MG PO TABS
ORAL_TABLET | ORAL | Status: AC
  Filled 2014-04-15: qty 1

## 2014-04-15 MED ORDER — NITROGLYCERIN 1 MG/10 ML FOR IR/CATH LAB
INTRA_ARTERIAL | Status: AC
  Filled 2014-04-15: qty 10

## 2014-04-15 MED ORDER — HEPARIN (PORCINE) IN NACL 2-0.9 UNIT/ML-% IJ SOLN
INTRAMUSCULAR | Status: AC
  Filled 2014-04-15: qty 1500

## 2014-04-15 MED ORDER — ONDANSETRON HCL 4 MG/2ML IJ SOLN
INTRAMUSCULAR | Status: AC
  Filled 2014-04-15: qty 2

## 2014-04-15 MED ORDER — SODIUM CHLORIDE 0.9 % IJ SOLN: 3.0000 mL | Freq: Two times a day (BID) | INTRAMUSCULAR | Status: DC

## 2014-04-15 MED ORDER — SODIUM CHLORIDE 0.9 % IV SOLN
1.0000 mL/kg/h | INTRAVENOUS | Status: AC
Start: 2014-04-15 — End: 2014-04-15

## 2014-04-15 MED ORDER — POTASSIUM CHLORIDE CRYS ER 20 MEQ PO TBCR
20.0000 meq | EXTENDED_RELEASE_TABLET | Freq: Once | ORAL | Status: AC
  Administered 2014-04-15: 20 meq via ORAL

## 2014-04-15 MED ORDER — LIDOCAINE HCL (PF) 1 % IJ SOLN
INTRAMUSCULAR | Status: AC
  Filled 2014-04-15: qty 30

## 2014-04-15 MED ORDER — TICAGRELOR 90 MG PO TABS
ORAL_TABLET | ORAL | Status: AC
  Administered 2014-04-15: 90 mg via ORAL
  Filled 2014-04-15: qty 2

## 2014-04-15 NOTE — Progress Notes (Signed)
Girth of upper forearm where hematoma is was measured at 1120 and was 28.5 cms. Coban applied and rechecked periodically during the shift with no change noted, last measured at 1800 and remains at 28.5 cms. Dr. Jacinto Halim into see patient at 1940 and assess hematoma.Report to Donn Pierini.

## 2014-04-15 NOTE — Progress Notes (Signed)
UR completed Danyal Adorno K. Sascha Baugher, RN, BSN, MSHL, CCM  04/15/2014 2:23 PM

## 2014-04-15 NOTE — CV Procedure (Addendum)
Procedure performed:  Left heart catheterization including hemodynamic monitoring of the left ventricle, LV gram. Selective right and left coronary arteriography. PTCA and stenting of the mid, crux and distal RCA for in-stent restenosis with implantation of overlapping stents, from distal to proximal a 2.5 x 16 mm Synergy, 3.0 x 32 mm overlapping with a 3.0 x 32 mm and a 3.0 x 20 mm Synergy stents. Stenosis reduced from 99% to 0%, TIMI flow improved from TIMI 2-1/2 to 3. A total of 1 90 mL of contrast was utilized for diagnostic and interventional procedure, extremely complex procedure.   Indication: Patient is a 69 year-old Caucasian male with history of hypertension,  hyperlipidemia,  Diabetes Mellitus   who presents with NSTEMI with positive serum troponin, chest pain very typical for unstable angina. Patient has known history of coronary artery disease and stent implantation to his right coronary artery in the remote past.  Hemodynamic data: Left club pressure was 125/-1 with end-diastolic pressure of 9 mmHg. Aortic pressure was 111/71 with a mean of 88 mmHg. There was no pressure gradient across the aortic valve.  RCA: Dominant vessel, has 2 stents in the mid and distal segments, severe in-stent restenosis noted in the distal stent with subtotal occlusion. Large PDA and PL branch. No collaterals to right coronary artery.  Left main: Large vessel smooth and normal.   Ramus intermediate: Very small branch. Normal.  LAD: Use origin to 2 moderate-sized diagonals, mild disease is noted. LAD and separate the apex.  Circumflex: Moderate size vessel, midsegment has a 30-40% stenosis.  LV: Normal LV systolic function, ejection fraction 55%  Interventional data: Successful but extremely difficult and complex coronary revascularization involving the dominant right coronary artery. Extremely difficult to cross  a wire through the subtotally occluded in-stent restenotic distal RCA. Multiple balloons,  guidewires and balloon support was utilized.  Successful  PTCA and stenting of the mid, crux and distal RCA for in-stent restenosis with implantation of overlapping stents, from distal to proximal a 2.5 x 16 mm Synergy, 3.0 x 32 mm overlapping with a 3.0 x 32 mm and a 3.0 x 20 mm Synergy stents. Stenosis reduced from 99% to 0%, TIMI flow improved from TIMI 2-1/2 to 3.   Recommendation:  Successful but extremely difficult PTCA and stenting to the in-stent restenotic segments of the mid mid to distal and distal right coronary artery with implantation of a posterior stents, would recommend aspirin indefinitely and BRILINTA probably long-term due to multiple stents and extremely difficult and complex anatomy and difficulty in advancing any device.  Technique: Under sterile precautions using a 6 French right radial arterial access a 5 [place] Kiker 4 diagnostic catheter was advanced into the ascending aorta and then into the left ventricle. Left ventricle was performed the RAO projection. The same catheter was then pulled into the ascending aorta and selective left and right coronary arteriography was performed. The catheter then pulled out of the body over the J-wire.  Technique of intervention: Using Angiomax and to correlation and maintaining ACT greater than 200 seconds, I initially utilized Ikari 1.0 right guide catheter to engage the right coronary artery. I attempted to place an cross the distal RCA with a BMW guidewire. I was unable to cross the lesion, I changed this to a feeder XT guidewire and initially attempted to cross the stenosis with a 2.0x10 mm Angiosculpt balloon but before I could introduce the balloon, due to difficulty experienced with the guidewires, I decided to use a 2.0 x 20  mm mini Trek balloon for support. In spite of multiple attempts, I was unable to cross the distal RCA in-stent restenotic segment.  I decided to exchange the guide to a 6 [place] AL-1 guide catheter to obtain better  backup support to the right coronary artery angiography and angioplasty. I used miracle brothers 6, pilot 200 300 cm wire, and eventually with the help of a sion 300 guidewire, I was able to cross the distal RCA. I used a 1.5 x 6 mm mini trek II to  cross this distal lesion, and multiple balloon inflations were performed at 14 atmospheric pressure. This was followed by multiple inflations with a 2.0 x 20 mm mini trek balloon at 14 atmospheric pressure 3. I then decided to stent the distal segment just proximal to the bifurcation of PL and PDA with implantation of a 2.5 x 16 mm Synergy DES which was deployed at 12 atmospheric pressure for 45 seconds, and I utilized the same stent balloon to perform balloon angioplasty throughout the distal RCA stent segment. I then laid 3 overlapping additional stents measuring 3.0 x 32 mm 2 and 3.0 x 20 mm 1 stent from the distal, mid and proximal to midsegment of the right coronary artery which are all deployed at 14 atmospheric pressure for 45 seconds each. I then postdilated these lesions with 3.25 x 20 mm Bensley Euphora balloon at 18 and 16 atmospheric pressure for 30 seconds 2. This is followed by multiple inflations throughout the stented segments at 16 atmospheric pressure for 25 seconds 3. Following this intracoronary nitroglycerin was administered and angiography was performed. During the whole process I repeated intracoronary nitroglycerin via steps. I had established excellent brisk flow postprocedure.  The guidewire was withdrawn, angiography repeated and guide catheter disengaged and pulled out of the body. Patient tolerates the procedure well. A total of 190 mL of contrast was utilized for diagnostic and interventional procedure.

## 2014-04-15 NOTE — Progress Notes (Signed)
Izik Bingman,RN holding manual pressure on site at this time while waiting on Dr. Jacinto Halim to arrive. Neely,RN

## 2014-04-15 NOTE — Progress Notes (Addendum)
Rt arm beginning to swell more and swelling medial to marking. Patient states arm is beginning to hurt. Patient states 5/10 Rt arm pain at this time. RAC IV checked and it is WNL. MD being notified again at this time. Neely,RN

## 2014-04-15 NOTE — H&P (View-Only) (Signed)
Subjective:  No further chest pain  Objective:  Vital Signs in the last 24 hours: Temp:  [97.8 F (36.6 C)-98.3 F (36.8 C)] 98 F (36.7 C) (01/17 1700) Pulse Rate:  [54-73] 61 (01/17 1700) Resp:  [18-20] 18 (01/17 1700) BP: (101-131)/(42-75) 131/74 mmHg (01/17 1700) SpO2:  [97 %-100 %] 97 % (01/17 1700) Weight:  [105.238 kg (232 lb 0.1 oz)] 105.238 kg (232 lb 0.1 oz) (01/17 0400)  Intake/Output from previous day: 01/16 0701 - 01/17 0700 In: 390.5 [P.O.:360; I.V.:20.5] Out: -   Physical Exam: Well build and mildly obese habitus who is in no acute distress. Appears stated age. Alert Ox3.   There is no cyanosis. HEENT: normal limits.No JVD.   CARDIAC EXAM: S1 S2 normal, no gallop, II/VI SEM noted in apex and right sternal border. Conducted to the carotids.  CHEST EXAM: No Tendernes. LUNGS: Clear to percuss and auscultate.  ABDOMEN: No hepatosplenomegaly. BS normal in all 4 quadrants. Abdomen is non-tender.  EXTREMITY: Warm, non tender trace bilateral pitting edema bilateral below knee and ankle.   MUSCULOSKELETAL EXAM: Intact with full range of motion in all 4 extremities.   NEUROLOGIC EXAM: Grossly intact without any focal deficits. Alert O x 3.   VASCULAR EXAM: No skin breakdown. Carotids normal amplitude with soft bruit. Extremities: Femoral pulse normal. Popliteal pulse normal ; Pedal pulse normal. Otherwise No prominent pulse felt in the abdomen. No varicose veins.   Lab Results: BMP  Recent Labs  04/13/14 1150 04/14/14 0429  NA 137 138  K 4.2 3.3*  CL 102 103  CO2 25 27  GLUCOSE 278* 127*  BUN 20 22  CREATININE 1.40* 1.37*  CALCIUM 9.8 9.5  GFRNONAA 50* 52*  GFRAA 59* 60*    CBC  Recent Labs Lab 04/14/14 0500  WBC 5.9  RBC 4.03*  HGB 12.7*  HCT 36.8*  PLT 198  MCV 91.3  MCH 31.5  MCHC 34.5  RDW 12.9    HEMOGLOBIN A1C Lab Results  Component Value Date   HGBA1C 7.0* 04/13/2014   MPG 154* 04/13/2014    Cardiac Panel  (last 3 results)  Recent Labs  04/13/14 1650 04/13/14 2200 04/14/14 0429  TROPONINI 0.13* 0.20* 0.15*   Lipid Panel     Component Value Date/Time   CHOL 167 04/14/2014 0429   TRIG 341* 04/14/2014 0429   HDL 23* 04/14/2014 0429   CHOLHDL 7.3 04/14/2014 0429   VLDL 68* 04/14/2014 0429   LDLCALC 76 04/14/2014 0429   LDLDIRECT 106.6 10/12/2007 0906     Hepatic Function Panel  Recent Labs  04/13/14 1432  PROT 6.7  ALBUMIN 3.9  AST 32  ALT 42  ALKPHOS 104  BILITOT 1.0  BILIDIR 0.1  IBILI 0.9    Cardiac Studies:       Component Value Date/Time   CHOL 171 10/12/2007 0906   TRIG 203* 10/12/2007 0906   HDL 22.4* 10/12/2007 0906   CHOLHDL 7.6 CALC 10/12/2007 0906   VLDL 41* 10/12/2007 0906   LDLCALC 130* 12/12/2006 1210      EKG: S. Bradycardia. Non specific T change. No ischemia. No change from 04/13/2014  Scheduled Meds: . aspirin EC  81 mg Oral Daily  . atorvastatin  40 mg Oral Daily  . carvedilol  12.5 mg Oral BID WC  . enoxaparin (LOVENOX) injection  100 mg Subcutaneous Q12H  . fenofibrate  160 mg Oral Daily  . glipiZIDE  10 mg Oral Q breakfast  . insulin aspart  0-15   Units Subcutaneous TID WC  . insulin glargine  13 Units Subcutaneous Daily  . levothyroxine  75 mcg Oral QAC breakfast  . lisinopril  20 mg Oral Daily  . metFORMIN  1,000 mg Oral BID WC  . niacin  100 mg Oral Daily  . omega-3 acid ethyl esters  2 g Oral Daily  . pantoprazole  40 mg Oral BID  . sodium chloride  3 mL Intravenous Q12H  . sodium chloride  3 mL Intravenous Q12H  . vitamin B-12  50 mcg Oral Daily   Continuous Infusions: . [START ON 04/15/2014] sodium chloride    . nitroGLYCERIN 5 mcg/min (04/13/14 1822)   PRN Meds:.sodium chloride, sodium chloride, acetaminophen, nitroGLYCERIN, ondansetron (ZOFRAN) IV, sodium chloride, sodium chloride    Assessment/Plan:  1. NSTEMI 2. Coronary atherosclerosis of native coronary artery H/O Coronary stenting  in 2000. No details available.  Outpatient work-up included:  Echo 01/04/11: Normal LVEF. Mild diastolic dysfunction. Moderate LVH and left atrial enlargement.  Lexiscan stress 01/15/11: Mild diaphragmatic attenuation artifact. EF 47%. Visually normal. Low risk. Impression: EKG 09/14/2013: Sinus bradycardia at the rate of 58 bpm, normal intervals, no evidence of ischemia. Normal EKG. No change from 09/14/2012  3. CKD stage 3 due to type 2 diabetes mellitus  4. Hypertension 5. Hyperlipidemia-mixed variety. 6. DM-2 Uncontrolled with renal manifestation.  Rec: Patient is agreeable to proceed with coronary angiogram. Wife present at bedside and discussed again the risks and benefits. Needs better control of DM and triglycerides. Hold Lisinopril until cath done to decrease renal complications.   Brei Pociask,JAGADEESH R, M.D. 04/14/2014, 6:04 PM [facility] Pager: [phone] Office: [phone] If no answer: [phone]  

## 2014-04-15 NOTE — Interval H&P Note (Signed)
History and Physical Interval Note:  04/15/2014 7:43 AM  Patrick Duran  has presented today for surgery, with the diagnosis of unstable angina  The various methods of treatment have been discussed with the patient and family. After consideration of risks, benefits and other options for treatment, the patient has consented to  Procedure(s): LEFT HEART CATHETERIZATION WITH CORONARY ANGIOGRAM (N/A) and possible PCI as a surgical intervention .  The patient's history has been reviewed, patient examined, no change in status, stable for surgery.  I have reviewed the patient's chart and labs.  Questions were answered to the patient's satisfaction.   Cath Lab Visit (complete for each Cath Lab visit)  Clinical Evaluation Leading to the Procedure:   ACS: Yes.    Non-ACS:    Anginal Classification: CCS IV  Anti-ischemic medical therapy: Minimal Therapy (1 class of medications)  Non-Invasive Test Results: No non-invasive testing performed  Prior CABG: No previous CABG         [facility]

## 2014-04-15 NOTE — Progress Notes (Signed)
Dr. Jacinto Halim arrived to room. Dr. Jacinto Halim readjusted TR band. Patient has a total of 13cc of air in TR band at this time. Dr. Jacinto Halim states to continue to monitor Swelling marked site. Dr. Jacinto Halim states to put Coban on swelling site with just a small amount of pressure. Dr Jacinto Halim states to continue to measure the site that is marked. Neely,RN

## 2014-04-15 NOTE — Progress Notes (Signed)
Upon arrival to Room patient became very nauseated and sick. Patient vomitting. Zofran given for nausea.Rt Arm where TR band is placed started to bleed.Got bleeding under control and 2cc of air added to TR band. Where TR band is placed is soft and no hematoma noted. Prox to TR band and to elbow beginning to get hard. MD notified and states to mark area. Area marked and Radial pulse noted. Will continue to monitor patient. Neely,RN

## 2014-04-15 NOTE — Progress Notes (Signed)
Continuing to Wait on Dr. Jacinto Halim to come to room. Patient states arm is still hurting. Rt Radial pulse noted.

## 2014-04-15 NOTE — Progress Notes (Signed)
Dr. Jacinto Halim states he is on his way to see patient at this time. Neely,RN

## 2014-04-15 NOTE — Care Management Note (Addendum)
    Page 1 of 2   04/16/2014     11:43:04 AM CARE MANAGEMENT NOTE 04/16/2014  Patient:  Patrick Duran, Patrick Duran   Account Number:  [id number]  Date Initiated:  04/15/2014  Documentation initiated by:  Donato Schultz  Subjective/Objective Assessment:   Unstable angina     Action/Plan:   CM to follow for disposition needs   Anticipated DC Date:  04/16/2014   Anticipated DC Plan:  HOME/SELF CARE      DC Planning Services  CM consult  Medication Assistance      Choice offered to / List presented to:             Status of service:  Completed, signed off Medicare Important Message given?  YES (If response is "NO", the following Medicare IM given date fields will be blank) Date Medicare IM given:  04/16/2014 Medicare IM given by:  [name] Date Additional Medicare IM given:   Additional Medicare IM given by:    Discharge Disposition:  HOME/SELF CARE  Per UR Regulation:  Reviewed for med. necessity/level of care/duration of stay  If discussed at Long Length of Stay Meetings, dates discussed:    Comments:  ---04/16/2014 1137 by [name]--- PT COPAY WILL BE $45-PRIOR AUTH IS NOT REQUIRED CM notified patient via phone call update.  [name] RN, BSN, MSHL, CCM  Nurse - Case Manager,  (Unit [phone]  04/16/2014 Benefits check remains pending; Patient ready for d/c. Patient states he wrote his own insurance policy and can complete self f/u. CM encourage daily med compliance and contact prescribing MD for assistance  if ever any problems getting his medication. CM provided Brilinta card and instructions. CM provided education on Brilinta assistance option if co-pay/cost too high.    [name] RN, BSN, MSHL, CCM  Nurse - Case Manager,  (Unit [phone]  04/15/2014 Brilinta benefits check in progress Provider closed for holiday.  CM requested f/u again on Tuesday 1/19 once provider re-opens.

## 2014-04-16 ENCOUNTER — Encounter (HOSPITAL_COMMUNITY): Payer: Self-pay | Admitting: Cardiology

## 2014-04-16 LAB — BASIC METABOLIC PANEL
Anion gap: 9 (ref 5–15)
BUN: 14 mg/dL (ref 6–23)
CO2: 24 mmol/L (ref 19–32)
Calcium: 9.1 mg/dL (ref 8.4–10.5)
Chloride: 108 mEq/L (ref 96–112)
Creatinine, Ser: 1.18 mg/dL (ref 0.50–1.35)
GFR calc Af Amer: 72 mL/min — ABNORMAL LOW (ref >90)
GFR calc non Af Amer: 62 mL/min — ABNORMAL LOW (ref >90)
Glucose, Bld: 100 mg/dL — ABNORMAL HIGH (ref 70–99)
Potassium: 3.8 mmol/L (ref 3.5–5.1)
Sodium: 141 mmol/L (ref 135–145)

## 2014-04-16 LAB — CBC
HCT: 34.2 % — ABNORMAL LOW (ref 39.0–52.0)
Hemoglobin: 11.9 g/dL — ABNORMAL LOW (ref 13.0–17.0)
MCH: 31.3 pg (ref 26.0–34.0)
MCHC: 34.8 g/dL (ref 30.0–36.0)
MCV: 90 fL (ref 78.0–100.0)
Platelets: 175 10*3/uL (ref 150–400)
RBC: 3.8 MIL/uL — ABNORMAL LOW (ref 4.22–5.81)
RDW: 12.5 % (ref 11.5–15.5)
WBC: 5.2 10*3/uL (ref 4.0–10.5)

## 2014-04-16 LAB — GLUCOSE, CAPILLARY: Glucose-Capillary: 88 mg/dL (ref 70–99)

## 2014-04-16 MED ORDER — TICAGRELOR 90 MG PO TABS: 90.0000 mg | ORAL_TABLET | Freq: Two times a day (BID) | ORAL | Status: DC

## 2014-04-16 MED ORDER — ASPIRIN 81 MG PO TBEC: 81.0000 mg | DELAYED_RELEASE_TABLET | Freq: Every day | ORAL | Status: DC

## 2014-04-16 MED FILL — Sodium Chloride IV Soln 0.9%: INTRAVENOUS | Qty: 50 | Status: AC

## 2014-04-16 NOTE — Discharge Instructions (Signed)
Acute Coronary Syndrome  Acute coronary syndrome (ACS) is an urgent problem in which the blood and oxygen supply to the heart is critically deficient. ACS requires hospitalization because one or more coronary arteries may be blocked.  ACS represents a range of conditions including:  · Previous angina that is now unstable, lasts longer, happens at rest, or is more intense.  · A heart attack, with heart muscle cell injury and death.  There are three vital coronary arteries that supply the heart muscle with blood and oxygen so that it can pump blood effectively. If blockages to these arteries develop, blood flow to the heart muscle is reduced. If the heart does not get enough blood, angina may occur as the first warning sign.  SYMPTOMS   · The most common signs of angina include:  ¨ Tightness or squeezing in the chest.  ¨ Feeling of heaviness on the chest.  ¨ Discomfort in the arms, neck, back, or jaw.  ¨ Shortness of breath and nausea.  ¨ Cold, wet skin.  · Angina is usually brought on by physical effort or excitement which increase the oxygen needs of the heart. These states increase the blood flow needs of the heart beyond what can be delivered.  · Other symptoms that are not as common include:  ¨ Fatigue  ¨ Unexplained feelings of nervousness or anxiety  ¨ Weakness  ¨ Diarrhea  · Sometimes, you may not have noticed any symptoms at all but still suffered a cardiac injury.  TREATMENT   · Medicines to help discomfort may include nitroglycerin (nitro) in the form of tablets or a spray for rapid relief, or longer-acting forms such as cream, patches, or capsules. (Be aware that there are many side effects and possible interactions with other drugs).  · Other medicines may be used to help the heart pump better.  · Procedures to open blocked arteries including angioplasty or stent placement to keep the arteries open.  · Open heart surgery may be needed when there are many blockages or they are in critical locations that  are best treated with surgery.  HOME CARE INSTRUCTIONS   · Do not use any tobacco products including cigarettes, chewing tobacco, or electronic cigarettes.  · Take one baby or adult aspirin daily, if your health care provider advises. This helps reduce the risk of a heart attack.  · It is very important that you follow the angina treatment prescribed by your health care provider. Make arrangements for proper follow-up care.  · Eat a heart healthy diet with salt and fat restrictions as advised.  · Regular exercise is good for you as long as it does not cause discomfort. Do not begin any new type of exercise until you check with your health care provider.  · If you are overweight, you should lose weight.  · Try to maintain normal blood lipid levels.  · Keep your blood pressure under control as recommended by your health care provider.  · You should tell your health care provider right away about any increase in the severity or frequency of your chest discomfort or angina attacks. When you have angina, you should stop what you are doing and sit down. This may bring relief in 3 to 5 minutes. If your health care provider has prescribed nitro, take it as directed.  · If your health care provider has given you a follow-up appointment, it is very important to keep that appointment. Not keeping the appointment could result in a chronic or   permanent injury, pain, and disability. If there is any problem keeping the appointment, you must call back to this facility for assistance.  SEEK IMMEDIATE MEDICAL CARE IF:   · You develop nausea, vomiting, or shortness of breath.  · You feel faint, lightheaded, or pass out.  · Your chest discomfort gets worse.  · You are sweating or experience sudden profound fatigue.  · You do not get relief of your chest pain after 3 doses of nitro.  · Your discomfort lasts longer than 15 minutes.  MAKE SURE YOU:   · Understand these instructions.  · Will watch your condition.  · Will get help right  away if you are not doing well or get worse.  · Take all medicines as directed by your health care provider.  Document Released: 03/15/2005 Document Revised: 03/20/2013 Document Reviewed: 07/17/2013  Exit[employer]® Patient Information ©2015 [employer]. This information is not intended to replace advice given to you by your health care provider. Make sure you discuss any questions you have with your health care provider.

## 2014-04-16 NOTE — Progress Notes (Signed)
CARDIAC REHAB PHASE I   PRE:  Rate/Rhythm: 70 SR    BP: sitting 130/66    SaO2:   MODE:  Ambulation: 1000 ft   POST:  Rate/Rhythm: 88 SR    BP: sitting 145/68     SaO2:   Tolerated well walking, no c/o. Pt voiced he would like to get back to exercising daily and having more energy. Discussed walking or riding his stationary bike and also CRPII which he requests referral be sent to [place]. Gave pt diet sheets and discussed stent, Brilinta and NTG. Good reception. [phone]   Harriet Masson CES, ACSM 04/16/2014 9:25 AM

## 2014-04-16 NOTE — Discharge Summary (Signed)
Physician Discharge Summary  Patient ID: Patrick Duran MRN: [id number] DOB/AGE: 05/27/1945 69 y.o.  Admit date: 04/13/2014 Discharge date: 04/16/2014  Discharge Diagnosis:  1. NSTEMI 2. Coronary atherosclerosis s/p PTCA and stent implantation.  3. CKD stage 3 due to type 2 diabetes mellitus  4. Hypertension 5. Hyperlipidemia-mixed variety. 6. DM-2 Uncontrolled with renal manifestation. 7. History suggestive of obstructive sleep apnea.  Significant Diagnostic Studies: 04/15/2014: Left heart catheterization including hemodynamic monitoring of the left ventricle, LV gram. Selective right and left coronary arteriography. PTCA and stenting of the mid, crux and distal RCA for in-stent restenosis with implantation of overlapping stents, from distal to proximal a 2.5 x 16 mm Synergy, 3.0 x 32 mm overlapping with a 3.0 x 32 mm and a 3.0 x 20 mm Synergy stents. Stenosis reduced from 99% to 0%, TIMI flow improved from TIMI 2-1/2 to 3.  Hospital Course: Patient is a 69 year old Caucasian male with history of known coronary artery disease, history of placement of 3 stents, details not known, sometime in 2010, hypertension, hyperlipidemia who presented to the emergency room on 04/13/2014 after he woke up with chest discomfort, associated with radiation to the neck and also to his arms and diaphoresis. Due to his multiple cardiovascular risk factors and his symptoms suggestive of unstable angina pectoris he was now admitted to the hospital for further management and evaluation.  He underwent cardiac catheterization on 04/15/2014 with PTCA and stenting of the mid, crux, and distal RCA for in-stent restenosis.  Soon after transfer to inpatient unit, he developed a hematoma with significant swelling and pain to the right forearm.  Manual pressure was held to achieve hemostasis then arm was wrapped with Coban and elevated.  Swelling has significantly improved this morning and he denies any pain.  He denies any chest  pain or SOB this morning.  Ambulating in room without difficulty.  Recommendations on discharge: Elevate right arm today to reduce swelling.  He will continue on Brilinta long-term and ASA indefinitely.  Follow up outpatient next week.  Hold Metformin until Thursday.    Discharge Exam: Blood pressure 130/66, pulse 72, temperature 97.6 F (36.4 C), temperature source Oral, resp. rate 18, height  (1.854 m), weight 107.6 kg (237 lb 3.4 oz), SpO2 99 %.   GENERAL: Well build and mildly obese habitus,in no acute distress. Appears Stated age. Alert Ox3. There is no cyanosis.  HEENT: normal limits. No JVD.  CARDIAC EXAM: S1, S2 normal, no gallop, II/VI SEM noted in apex and right sternal border, conducted to the carotids. CHEST EXAM: No tenderness.  LUNGS: Clear to auscultatation. ABDOMEN: No hepatosplenomegaly. BS normal in all 4 quadrants. Abdomen is non-tender. EXTREMITY: Warm, non tender, trace bilateral pitting edema bilateral below knee and ankle.  MUSCULOSKELETAL EXAM: Intact with full range of motion in all 4 extremities.  NEUROLOGIC EXAM: Grossly intact without any focal deficits. Alert O x 3.  VASCULAR EXAM: No skin breakdown. Carotids:normal amplitude with soft bruit. Peripheral: Femoral pulse normal. Popliteal pulse normal; Pedal pulse normal. Otherwise No prominent pulse felt in the abdomen. No varicose veins. Radial pulses 2+ and equal bilaterally, minimal swelling remains in right forearm.   Labs:   Lab Results  Component Value Date   WBC 5.2 04/16/2014   HGB 11.9* 04/16/2014   HCT 34.2* 04/16/2014   MCV 90.0 04/16/2014   PLT 175 04/16/2014    Recent Labs Lab 04/13/14 1432  04/16/14 0358  NA  --   < > 141  K  --   < >  3.8  CL  --   < > 108  CO2  --   < > 24  BUN  --   < > 14  CREATININE  --   < > 1.18  CALCIUM  --   < > 9.1  PROT 6.7  --   --   BILITOT 1.0  --   --   ALKPHOS 104  --   --   ALT 42  --   --   AST 32  --   --   GLUCOSE  --   < > 100*   < > = values in this interval not displayed. Lab Results  Component Value Date   TROPONINI 0.15* 04/14/2014    Lipid Panel     Component Value Date/Time   CHOL 167 04/14/2014 0429   TRIG 341* 04/14/2014 0429   HDL 23* 04/14/2014 0429   CHOLHDL 7.3 04/14/2014 0429   VLDL 68* 04/14/2014 0429   LDLCALC 76 04/14/2014 0429    EKG 04/16/2013: NSR, Non specific T-wave abnormality, no significant change from prior.    Radiology: Dg Chest 2 View  04/13/2014   CLINICAL DATA:  69 year old male with shortness of breath, chest pain, nausea and dizziness  EXAM: CHEST  2 VIEW  COMPARISON:  Prior chest x-ray 02/09/2011  FINDINGS: Stable cardiac and mediastinal contours. Atherosclerotic calcifications noted in the tortuous aorta. Similar appearance of a prominent epicardial fat pad versus scarring in the inferior lingula. No focal airspace consolidation, pulmonary edema, pleural effusion or pneumothorax. Stable mild central bronchitic change. Incompletely imaged anterior cervical fusion hardware with screw fractures. No acute osseous abnormality.  IMPRESSION: Stable chest x-ray without evidence of acute cardiopulmonary process.   Electronically Signed   By: Malachy Moan M.D.   On: 04/13/2014 14:02      FOLLOW UP PLANS AND APPOINTMENTS Discharge Instructions    Discharge patient    Complete by:  As directed             Medication List    STOP taking these medications        aspirin 325 MG tablet  Replaced by:  aspirin 81 MG EC tablet      TAKE these medications        acetaminophen 500 MG tablet  Commonly known as:  TYLENOL  Take 500 mg by mouth every 6 (six) hours as needed. For pain     Alpha-Lipoic Acid 300 MG Caps  Take by mouth daily.     aspirin 81 MG EC tablet  Take 1 tablet (81 mg total) by mouth daily.     atorvastatin 40 MG tablet  Commonly known as:  LIPITOR  Take 40 mg by mouth daily.     carvedilol 12.5 MG tablet  Commonly known as:  COREG  Take 12.5 mg by  mouth 2 (two) times daily with a meal.     co-enzyme Q-10 30 MG capsule  Take 120 mg by mouth daily.     fenofibrate micronized 134 MG capsule  Commonly known as:  LOFIBRA  Take 134 mg by mouth daily before breakfast.     fish oil-omega-3 fatty acids 1000 MG capsule  Take 2 g by mouth daily.     FLAXSEED OIL PO  Take by mouth daily.     glipiZIDE 10 MG 24 hr tablet  Commonly known as:  GLUCOTROL XL  Take 10 mg by mouth daily with breakfast.     Grape Seed Oil  Take by  mouth daily.     Insulin Glargine 100 UNIT/ML Solostar Pen  Commonly known as:  LANTUS  Inject 10-25 Units into the skin daily at 10 pm.     levothyroxine 75 MCG tablet  Commonly known as:  SYNTHROID, LEVOTHROID  Take 75 mcg by mouth daily before breakfast.     lisinopril-hydrochlorothiazide 20-25 MG per tablet  Commonly known as:  PRINZIDE,ZESTORETIC  Take 1 tablet by mouth daily.     metFORMIN 1000 MG tablet  Commonly known as:  GLUCOPHAGE  Take 1,000 mg by mouth 2 (two) times daily with a meal.     MULTIVITAMIN PO  Take 1 tablet by mouth daily.     niacin 100 MG tablet  Take 100 mg by mouth daily.     pantoprazole 40 MG tablet  Commonly known as:  PROTONIX  Take 40 mg by mouth 2 (two) times daily.     POTASSIUM GLUCONATE PO  Take 1 tablet by mouth daily.     sucralfate 1 G tablet  Commonly known as:  CARAFATE  Take 1 tablet (1 g total) by mouth 4 (four) times daily.     ticagrelor 90 MG Tabs tablet  Commonly known as:  BRILINTA  Take 1 tablet (90 mg total) by mouth 2 (two) times daily.     vitamin B-12 100 MCG tablet  Commonly known as:  CYANOCOBALAMIN  Take 50 mcg by mouth daily.     ZINC ACETATE PO  Take 1 tablet by mouth daily.           Follow-up Information    Follow up with Pamella Pert, MD On 04/24/2014.   Specialty:  Cardiology   Why:  at 9:00am   Contact information:   [address] [place] [address] [phone]        Erling Conte, NP-C 04/16/2014, 8:16 AM  Pager: [phone] Office: [phone] I have personally reviewed the patient's record and performed physical exam and agree with the assessment and plan of Ms. Marcy Salvo, NP-C.  Pamella Pert, MD [facility] Pager: [phone].   Office: [phone] If no answer: Mobile: [phone]

## 2014-05-28 ENCOUNTER — Telehealth (HOSPITAL_COMMUNITY): Payer: Self-pay | Admitting: Cardiac Rehabilitation

## 2014-05-28 NOTE — Telephone Encounter (Signed)
pc to pt to enroll in cardiac rehab. Per pt wife pt not interested due to 20% copayment expense and exercising on his own. Advised to call cardiac rehab if decides he would like to enroll.  Dr. Jacinto Halim made aware.

## 2014-11-05 ENCOUNTER — Encounter (HOSPITAL_COMMUNITY): Payer: Self-pay | Admitting: *Deleted

## 2014-11-05 ENCOUNTER — Emergency Department (HOSPITAL_COMMUNITY): Payer: Medicare Other

## 2014-11-05 ENCOUNTER — Inpatient Hospital Stay (HOSPITAL_COMMUNITY)
Admission: EM | Admit: 2014-11-05 | Discharge: 2014-11-08 | DRG: 247 | Disposition: A | Discharge status: Home / Self Care | Payer: Medicare Other | Attending: Cardiology | Admitting: Cardiology

## 2014-11-05 DIAGNOSIS — I200 Unstable angina: Secondary | ICD-10-CM | POA: Diagnosis present

## 2014-11-05 DIAGNOSIS — T82857A Stenosis of cardiac prosthetic devices, implants and grafts, initial encounter: Secondary | ICD-10-CM | POA: Diagnosis present

## 2014-11-05 DIAGNOSIS — N183 Chronic kidney disease, stage 3 (moderate): Secondary | ICD-10-CM | POA: Diagnosis present

## 2014-11-05 DIAGNOSIS — I4891 Unspecified atrial fibrillation: Secondary | ICD-10-CM | POA: Diagnosis not present

## 2014-11-05 DIAGNOSIS — R079 Chest pain, unspecified: Secondary | ICD-10-CM

## 2014-11-05 DIAGNOSIS — Z79899 Other long term (current) drug therapy: Secondary | ICD-10-CM | POA: Diagnosis not present

## 2014-11-05 DIAGNOSIS — E1122 Type 2 diabetes mellitus with diabetic chronic kidney disease: Secondary | ICD-10-CM | POA: Diagnosis present

## 2014-11-05 DIAGNOSIS — Z794 Long term (current) use of insulin: Secondary | ICD-10-CM | POA: Diagnosis not present

## 2014-11-05 DIAGNOSIS — I10 Essential (primary) hypertension: Secondary | ICD-10-CM | POA: Diagnosis present

## 2014-11-05 DIAGNOSIS — Z885 Allergy status to narcotic agent status: Secondary | ICD-10-CM | POA: Diagnosis not present

## 2014-11-05 DIAGNOSIS — Z91012 Allergy to eggs: Secondary | ICD-10-CM | POA: Diagnosis not present

## 2014-11-05 DIAGNOSIS — Y831 Surgical operation with implant of artificial internal device as the cause of abnormal reaction of the patient, or of later complication, without mention of misadventure at the time of the procedure: Secondary | ICD-10-CM | POA: Diagnosis present

## 2014-11-05 DIAGNOSIS — Z91010 Allergy to peanuts: Secondary | ICD-10-CM | POA: Diagnosis not present

## 2014-11-05 DIAGNOSIS — E780 Pure hypercholesterolemia: Secondary | ICD-10-CM | POA: Diagnosis present

## 2014-11-05 DIAGNOSIS — Z7982 Long term (current) use of aspirin: Secondary | ICD-10-CM

## 2014-11-05 DIAGNOSIS — Z87891 Personal history of nicotine dependence: Secondary | ICD-10-CM | POA: Diagnosis not present

## 2014-11-05 DIAGNOSIS — I25110 Atherosclerotic heart disease of native coronary artery with unstable angina pectoris: Secondary | ICD-10-CM | POA: Diagnosis present

## 2014-11-05 DIAGNOSIS — E1165 Type 2 diabetes mellitus with hyperglycemia: Secondary | ICD-10-CM | POA: Diagnosis present

## 2014-11-05 DIAGNOSIS — E785 Hyperlipidemia, unspecified: Secondary | ICD-10-CM | POA: Diagnosis present

## 2014-11-05 DIAGNOSIS — I129 Hypertensive chronic kidney disease with stage 1 through stage 4 chronic kidney disease, or unspecified chronic kidney disease: Secondary | ICD-10-CM | POA: Diagnosis present

## 2014-11-05 HISTORY — DX: Hypothyroidism, unspecified: E03.9

## 2014-11-05 HISTORY — DX: Personal history of other diseases of the digestive system: Z87.19

## 2014-11-05 HISTORY — DX: Adverse effect of unspecified anesthetic, initial encounter: T41.45XA

## 2014-11-05 HISTORY — DX: Unspecified hearing loss, bilateral: H91.93

## 2014-11-05 HISTORY — DX: Other specified postprocedural states: Z98.890

## 2014-11-05 HISTORY — DX: Personal history of peptic ulcer disease: Z87.11

## 2014-11-05 HISTORY — DX: Headache: R51

## 2014-11-05 HISTORY — DX: Other complications of anesthesia, initial encounter: T88.59XA

## 2014-11-05 HISTORY — DX: Headache, unspecified: R51.9

## 2014-11-05 HISTORY — DX: Acute myocardial infarction, unspecified: I21.9

## 2014-11-05 HISTORY — DX: Nausea with vomiting, unspecified: R11.2

## 2014-11-05 HISTORY — DX: Type 2 diabetes mellitus without complications: E11.9

## 2014-11-05 LAB — BASIC METABOLIC PANEL
Anion gap: 9 (ref 5–15)
BUN: 18 mg/dL (ref 6–20)
CO2: 25 mmol/L (ref 22–32)
Calcium: 9.6 mg/dL (ref 8.9–10.3)
Chloride: 103 mmol/L (ref 101–111)
Creatinine, Ser: 1.35 mg/dL — ABNORMAL HIGH (ref 0.61–1.24)
GFR calc Af Amer: >60 mL/min (ref >60)
GFR calc non Af Amer: 52 mL/min — ABNORMAL LOW (ref >60)
Glucose, Bld: 152 mg/dL — ABNORMAL HIGH (ref 65–99)
Potassium: 4.4 mmol/L (ref 3.5–5.1)
Sodium: 137 mmol/L (ref 135–145)

## 2014-11-05 LAB — I-STAT TROPONIN, ED: Troponin i, poc: 0 ng/mL (ref 0.00–0.08)

## 2014-11-05 LAB — CBC
HCT: 36.7 % — ABNORMAL LOW (ref 39.0–52.0)
Hemoglobin: 12.6 g/dL — ABNORMAL LOW (ref 13.0–17.0)
MCH: 31.5 pg (ref 26.0–34.0)
MCHC: 34.3 g/dL (ref 30.0–36.0)
MCV: 91.8 fL (ref 78.0–100.0)
Platelets: 211 10*3/uL (ref 150–400)
RBC: 4 MIL/uL — ABNORMAL LOW (ref 4.22–5.81)
RDW: 13 % (ref 11.5–15.5)
WBC: 7.4 10*3/uL (ref 4.0–10.5)

## 2014-11-05 LAB — BRAIN NATRIURETIC PEPTIDE: B Natriuretic Peptide: 85.5 pg/mL (ref 0.0–100.0)

## 2014-11-05 NOTE — ED Notes (Signed)
Pt to ED from home via [facility] c/o centralized, non radiating pain. Hx of three stents with most recent placed in January. Pt took 324 mg asa and nitro x 3 with relief. Pt was out trying to locate dog when pain began. Also reports exertional shortness of breath x 3 weeks

## 2014-11-05 NOTE — ED Provider Notes (Signed)
CSN: [id number]     Arrival date & time 11/05/14  2058 History   First MD Initiated Contact with Patient 11/05/14 2148     Chief Complaint  Patient presents with  . Chest Pain     (Consider location/radiation/quality/duration/timing/severity/associated sxs/prior Treatment) Patient is a 69 y.o. male presenting with chest pain. The history is provided by the patient (the pt complains of dyspnea on exirtion and chest pain.  he took 3 nitro today).  Chest Pain Pain location:  Substernal area Pain quality: aching   Pain radiates to:  Does not radiate Pain radiates to the back: no   Pain severity:  Moderate Onset quality:  Sudden Timing:  Intermittent Progression:  Partially resolved Context: not breathing   Associated symptoms: no abdominal pain, no back pain, no cough, no fatigue and no headache     Past Medical History  Diagnosis Date  . Arthritis   . GERD (gastroesophageal reflux disease)   . Diabetes mellitus   . Hyperlipidemia   . Hypertension   . Coronary artery disease   . Vertigo   . Hearing loss     bilateral  . Trouble swallowing   . Easy bruising    Past Surgical History  Procedure Laterality Date  . Cholecystectomy    . Cardiac catheterization  2000  . Spine surgery  2004    cervical fusion  . Left heart catheterization with coronary angiogram N/A 04/15/2014    Procedure: LEFT HEART CATHETERIZATION WITH CORONARY ANGIOGRAM;  Surgeon: Pamella Pert, MD;  Location: [facility] CATH LAB;  Service: Cardiovascular;  Laterality: N/A;  . Cardiac catheterization  04/15/2014    Procedure: CORONARY STENT INTERVENTION;  Surgeon: Pamella Pert, MD;  Location: [facility] CATH LAB;  Service: Cardiovascular;;  RCA   Family History  Problem Relation Age of Onset  . Diabetes Mother   . Hypothyroidism Mother    History  Substance Use Topics  . Smoking status: Former Games developer  . Smokeless tobacco: Never Used  . Alcohol Use: Yes    Review of Systems  Constitutional: Negative for  appetite change and fatigue.  HENT: Negative for congestion, ear discharge and sinus pressure.   Eyes: Negative for discharge.  Respiratory: Negative for cough.   Cardiovascular: Positive for chest pain.  Gastrointestinal: Negative for abdominal pain and diarrhea.  Genitourinary: Negative for frequency and hematuria.  Musculoskeletal: Negative for back pain.  Skin: Negative for rash.  Neurological: Negative for seizures and headaches.  Psychiatric/Behavioral: Negative for hallucinations.      Allergies  Codeine sulfate; Eggs or egg-derived products; Hydrocodone; Peanut-containing drug products; and Percocet  Home Medications   Prior to Admission medications   Medication Sig Start Date End Date Taking? Authorizing Provider  acetaminophen (TYLENOL) 500 MG tablet Take 500 mg by mouth every 6 (six) hours as needed. For pain   Yes Historical Provider, MD  Alpha-Lipoic Acid 300 MG CAPS Take by mouth daily.     Yes Historical Provider, MD  aspirin EC 81 MG EC tablet Take 1 tablet (81 mg total) by mouth daily. 04/16/14  Yes Marcy Salvo, NP  atorvastatin (LIPITOR) 40 MG tablet Take 40 mg by mouth daily.  12/04/10  Yes Historical Provider, MD  carvedilol (COREG) 12.5 MG tablet Take 12.5 mg by mouth 2 (two) times daily with a meal.   Yes Historical Provider, MD  co-enzyme Q-10 30 MG capsule Take 120 mg by mouth daily.     Yes Historical Provider, MD  fenofibrate micronized (LOFIBRA) 134 MG  capsule Take 134 mg by mouth daily before breakfast.   Yes Historical Provider, MD  fish oil-omega-3 fatty acids 1000 MG capsule Take 2 g by mouth daily.     Yes Historical Provider, MD  Flaxseed, Linseed, (FLAXSEED OIL PO) Take by mouth daily.     Yes Historical Provider, MD  glipiZIDE (GLUCOTROL XL) 10 MG 24 hr tablet Take 10 mg by mouth 2 (two) times daily.    Yes Historical Provider, MD  Grape Seed OIL Take 1 tablet by mouth daily.    Yes Historical Provider, MD  Insulin Glargine (LANTUS) 100 UNIT/ML  Solostar Pen Inject 20 Units into the skin daily at 10 pm.    Yes Historical Provider, MD  levothyroxine (SYNTHROID, LEVOTHROID) 75 MCG tablet Take 75 mcg by mouth daily before breakfast.   Yes Historical Provider, MD  lisinopril-hydrochlorothiazide (PRINZIDE,ZESTORETIC) 20-25 MG per tablet Take 1 tablet by mouth daily.   Yes Historical Provider, MD  metFORMIN (GLUCOPHAGE) 1000 MG tablet Take 1,000 mg by mouth 2 (two) times daily with a meal.     Yes Historical Provider, MD  Multiple Vitamin (MULTIVITAMIN PO) Take 1 tablet by mouth daily.    Yes Historical Provider, MD  niacin 100 MG tablet Take 100 mg by mouth daily.   Yes Historical Provider, MD  pantoprazole (PROTONIX) 40 MG tablet Take 40 mg by mouth daily.  12/12/10  Yes Historical Provider, MD  POTASSIUM GLUCONATE PO Take 1 tablet by mouth daily.    Yes Historical Provider, MD  ticagrelor (BRILINTA) 90 MG TABS tablet Take 1 tablet (90 mg total) by mouth 2 (two) times daily. 04/16/14  Yes Marcy Salvo, NP  vitamin B-12 (CYANOCOBALAMIN) 100 MCG tablet Take 50 mcg by mouth daily.     Yes Historical Provider, MD  Zinc Acetate, Oral, (ZINC ACETATE PO) Take 1 tablet by mouth daily.    Yes Historical Provider, MD  sucralfate (CARAFATE) 1 G tablet Take 1 tablet (1 g total) by mouth 4 (four) times daily. Patient not taking: Reported on 04/13/2014 02/09/11 02/09/12  Bethany Hunt, PA-C   BP 127/48 mmHg  Pulse 60  Temp(Src) 98.4 F (36.9 C) (Oral)  Resp 13  Ht 6\' 1"  (1.854 m)  Wt 233 lb (105.688 kg)  BMI 30.75 kg/m2  SpO2 99% Physical Exam  Constitutional: He is oriented to person, place, and time. He appears well-developed.  HENT:  Head: Normocephalic.  Eyes: Conjunctivae and EOM are normal. No scleral icterus.  Neck: Neck supple. No thyromegaly present.  Cardiovascular: Normal rate and regular rhythm.  Exam reveals no gallop and no friction rub.   No murmur heard. Pulmonary/Chest: No stridor. He has no wheezes. He has no rales. He  exhibits no tenderness.  Abdominal: He exhibits no distension. There is no tenderness. There is no rebound.  Musculoskeletal: Normal range of motion. He exhibits no edema.  Lymphadenopathy:    He has no cervical adenopathy.  Neurological: He is oriented to person, place, and time. He exhibits normal muscle tone. Coordination normal.  Skin: No rash noted. No erythema.  Psychiatric: He has a normal mood and affect. His behavior is normal.    ED Course  Procedures (including critical care time) Labs Review Labs Reviewed  BASIC METABOLIC PANEL - Abnormal; Notable for the following:    Glucose, Bld 152 (*)    Creatinine, Ser 1.35 (*)    GFR calc non Af Amer 52 (*)    All other components within normal limits  CBC - Abnormal;  Notable for the following:    RBC 4.00 (*)    Hemoglobin 12.6 (*)    HCT 36.7 (*)    All other components within normal limits  BRAIN NATRIURETIC PEPTIDE  I-STAT TROPOININ, ED    Imaging Review Dg Chest 2 View  11/05/2014   CLINICAL DATA:  Progressive chest pain  EXAM: CHEST  2 VIEW  COMPARISON:  April 13, 2014  FINDINGS: There is mild scarring in the left base. Lungs elsewhere clear. Heart is upper normal in size with pulmonary vascularity within normal limits. There are coronary artery stents apparent. No adenopathy. There is postoperative change in the lower cervical spine.  IMPRESSION: Scarring left base. No edema or consolidation. No change in cardiac silhouette.   Electronically Signed   By: Bretta Bang III M.D.   On: 11/05/2014 21:52     EKG Interpretation   Date/Time:  Tuesday November 05 2014 21:03:09 EDT Ventricular Rate:  66 PR Interval:  175 QRS Duration: 85 QT Interval:  370 QTC Calculation: 388 R Axis:   -25 Text Interpretation:  Sinus rhythm Borderline left axis deviation Abnormal  R-wave progression, early transition Confirmed by Oskar Cretella  MD, Bird Swetz  ([id number]) on 11/05/2014 9:54:30 PM      MDM   Final diagnoses:  Chest pain at rest     Chest pain.  Spoke with dr. Nadara Eaton and he wanted the pt admitted and he will take care of the pt    Bethann Berkshire, MD 11/05/14 2338

## 2014-11-05 NOTE — ED Notes (Signed)
MD at bedside. 

## 2014-11-05 NOTE — ED Notes (Signed)
Pt to ED from home c/o centralized chest pain onset while out walking and looking for his dog. Hx of 3 stents, last stent placed in January, pt takes Brilinta. Pt also reports exertional shortness of breath x 3 weeks. Has hx of same and was taking lisinopril-hctz for fluid. Denies any new weight gain

## 2014-11-06 LAB — BASIC METABOLIC PANEL
Anion gap: 9 (ref 5–15)
BUN: 17 mg/dL (ref 6–20)
CO2: 24 mmol/L (ref 22–32)
Calcium: 9.3 mg/dL (ref 8.9–10.3)
Chloride: 104 mmol/L (ref 101–111)
Creatinine, Ser: 1.23 mg/dL (ref 0.61–1.24)
GFR calc Af Amer: >60 mL/min (ref >60)
GFR calc non Af Amer: 59 mL/min — ABNORMAL LOW (ref >60)
Glucose, Bld: 136 mg/dL — ABNORMAL HIGH (ref 65–99)
Potassium: 3.7 mmol/L (ref 3.5–5.1)
Sodium: 137 mmol/L (ref 135–145)

## 2014-11-06 LAB — CBG MONITORING, ED: Glucose-Capillary: 94 mg/dL (ref 65–99)

## 2014-11-06 LAB — TROPONIN I
Troponin I: <0.03 ng/mL (ref <0.031)
Troponin I: <0.03 ng/mL (ref <0.031)
Troponin I: <0.03 ng/mL (ref <0.031)

## 2014-11-06 LAB — GLUCOSE, CAPILLARY
Glucose-Capillary: 126 mg/dL — ABNORMAL HIGH (ref 65–99)
Glucose-Capillary: 272 mg/dL — ABNORMAL HIGH (ref 65–99)
Glucose-Capillary: 219 mg/dL — ABNORMAL HIGH (ref 65–99)
Glucose-Capillary: 104 mg/dL — ABNORMAL HIGH (ref 65–99)

## 2014-11-06 LAB — PROTIME-INR
INR: 1.21 (ref 0.00–1.49)
Prothrombin Time: 15.4 seconds — ABNORMAL HIGH (ref 11.6–15.2)

## 2014-11-06 LAB — HEPARIN LEVEL (UNFRACTIONATED): Heparin Unfractionated: 0.17 IU/mL — ABNORMAL LOW (ref 0.30–0.70)

## 2014-11-06 MED ORDER — DEXTROSE-NACL 5-0.45 % IV SOLN
INTRAVENOUS | Status: DC
  Administered 2014-11-06: 09:00:00 via INTRAVENOUS

## 2014-11-06 MED ORDER — INSULIN ASPART 100 UNIT/ML ~~LOC~~ SOLN
0.0000 [IU] | Freq: Three times a day (TID) | SUBCUTANEOUS | Status: DC
  Administered 2014-11-06: 8 [IU] via SUBCUTANEOUS
  Administered 2014-11-06: 5 [IU] via SUBCUTANEOUS

## 2014-11-06 MED ORDER — NITROGLYCERIN 0.4 MG SL SUBL: 0.4000 mg | SUBLINGUAL_TABLET | SUBLINGUAL | Status: DC | PRN

## 2014-11-06 MED ORDER — HEPARIN (PORCINE) IN NACL 100-0.45 UNIT/ML-% IJ SOLN
1600.0000 [IU]/h | INTRAMUSCULAR | Status: DC
  Administered 2014-11-06: 1300 [IU]/h via INTRAVENOUS
  Administered 2014-11-06: 1600 [IU]/h via INTRAVENOUS
  Filled 2014-11-06 (×3): qty 250

## 2014-11-06 MED ORDER — NITROGLYCERIN IN D5W 200-5 MCG/ML-% IV SOLN
0.0000 ug/min | INTRAVENOUS | Status: DC
  Administered 2014-11-06: 10 ug/min via INTRAVENOUS

## 2014-11-06 MED ORDER — INSULIN GLARGINE 100 UNIT/ML ~~LOC~~ SOLN
20.0000 [IU] | Freq: Every day | SUBCUTANEOUS | Status: DC
  Administered 2014-11-06 – 2014-11-07 (×2): 20 [IU] via SUBCUTANEOUS
  Filled 2014-11-06 (×3): qty 0.2

## 2014-11-06 MED ORDER — SODIUM CHLORIDE 0.9 % IJ SOLN: 3.0000 mL | Freq: Two times a day (BID) | INTRAMUSCULAR | Status: DC

## 2014-11-06 MED ORDER — HEPARIN BOLUS VIA INFUSION
4000.0000 [IU] | Freq: Once | INTRAVENOUS | Status: AC
  Administered 2014-11-06: 4000 [IU] via INTRAVENOUS
  Filled 2014-11-06: qty 4000

## 2014-11-06 MED ORDER — SODIUM CHLORIDE 0.9 % WEIGHT BASED INFUSION
1.0000 mL/kg/h | INTRAVENOUS | Status: DC
  Administered 2014-11-06: 1 mL/kg/h via INTRAVENOUS

## 2014-11-06 MED ORDER — ACETAMINOPHEN 325 MG PO TABS
650.0000 mg | ORAL_TABLET | ORAL | Status: DC | PRN
  Administered 2014-11-06: 650 mg via ORAL
  Filled 2014-11-06: qty 2

## 2014-11-06 MED ORDER — LISINOPRIL 10 MG PO TABS
20.0000 mg | ORAL_TABLET | Freq: Every day | ORAL | Status: DC
  Administered 2014-11-06 – 2014-11-08 (×2): 20 mg via ORAL
  Filled 2014-11-06: qty 1
  Filled 2014-11-06: qty 2
  Filled 2014-11-06: qty 1

## 2014-11-06 MED ORDER — HYDROCHLOROTHIAZIDE 25 MG PO TABS
25.0000 mg | ORAL_TABLET | Freq: Every day | ORAL | Status: DC
  Administered 2014-11-06 – 2014-11-08 (×2): 25 mg via ORAL
  Filled 2014-11-06 (×3): qty 1

## 2014-11-06 MED ORDER — SODIUM CHLORIDE 0.9 % IJ SOLN: 3.0000 mL | INTRAMUSCULAR | Status: DC | PRN

## 2014-11-06 MED ORDER — LEVOTHYROXINE SODIUM 75 MCG PO TABS
75.0000 ug | ORAL_TABLET | Freq: Every day | ORAL | Status: DC
  Administered 2014-11-06 – 2014-11-08 (×3): 75 ug via ORAL
  Filled 2014-11-06 (×4): qty 1

## 2014-11-06 MED ORDER — PANTOPRAZOLE SODIUM 40 MG PO TBEC
40.0000 mg | DELAYED_RELEASE_TABLET | Freq: Every day | ORAL | Status: DC
  Administered 2014-11-06 – 2014-11-08 (×2): 40 mg via ORAL
  Filled 2014-11-06 (×2): qty 1

## 2014-11-06 MED ORDER — TICAGRELOR 90 MG PO TABS
90.0000 mg | ORAL_TABLET | Freq: Two times a day (BID) | ORAL | Status: DC
  Administered 2014-11-06 – 2014-11-07 (×3): 90 mg via ORAL
  Filled 2014-11-06 (×5): qty 1

## 2014-11-06 MED ORDER — LISINOPRIL-HYDROCHLOROTHIAZIDE 20-25 MG PO TABS: 1.0000 | ORAL_TABLET | Freq: Every day | ORAL | Status: DC

## 2014-11-06 MED ORDER — ASPIRIN EC 81 MG PO TBEC
81.0000 mg | DELAYED_RELEASE_TABLET | Freq: Every day | ORAL | Status: DC
  Administered 2014-11-06 – 2014-11-08 (×2): 81 mg via ORAL
  Filled 2014-11-06 (×3): qty 1

## 2014-11-06 MED ORDER — HEPARIN BOLUS VIA INFUSION
3000.0000 [IU] | Freq: Once | INTRAVENOUS | Status: AC
  Administered 2014-11-06: 3000 [IU] via INTRAVENOUS
  Filled 2014-11-06: qty 3000

## 2014-11-06 MED ORDER — NITROGLYCERIN IN D5W 200-5 MCG/ML-% IV SOLN
0.0000 ug/min | Freq: Once | INTRAVENOUS | Status: AC
  Administered 2014-11-06: 5 ug/min via INTRAVENOUS
  Filled 2014-11-06: qty 250

## 2014-11-06 MED ORDER — ONDANSETRON HCL 4 MG/2ML IJ SOLN: 4.0000 mg | Freq: Four times a day (QID) | INTRAMUSCULAR | Status: DC | PRN

## 2014-11-06 MED ORDER — SODIUM CHLORIDE 0.9 % IV SOLN: 250.0000 mL | INTRAVENOUS | Status: DC | PRN

## 2014-11-06 MED ORDER — ZOLPIDEM TARTRATE 5 MG PO TABS: 5.0000 mg | ORAL_TABLET | Freq: Every evening | ORAL | Status: DC | PRN

## 2014-11-06 MED ORDER — ATORVASTATIN CALCIUM 40 MG PO TABS
40.0000 mg | ORAL_TABLET | Freq: Every day | ORAL | Status: DC
  Administered 2014-11-06 – 2014-11-07 (×2): 40 mg via ORAL
  Filled 2014-11-06 (×3): qty 1

## 2014-11-06 MED ORDER — INSULIN GLARGINE 100 UNIT/ML SOLOSTAR PEN: 20.0000 [IU] | PEN_INJECTOR | Freq: Every day | SUBCUTANEOUS | Status: DC

## 2014-11-06 NOTE — Progress Notes (Addendum)
Pt admitted from ED with chest pain, denies any pain at this time, on Nitroglycerin drip at 36mcg/min, pt settled in bed,telemetry box put on pt, call light within pt's reach, will continue to monitor. Obasogie-Asidi, Javione Gunawan Efe

## 2014-11-06 NOTE — Progress Notes (Signed)
ANTICOAGULATION CONSULT NOTE - Follow Up Consult  Pharmacy Consult for Heparin Indication: chest pain/ACS  Allergies  Allergen Reactions  . Codeine Sulfate     REACTION: nausea  . Eggs Or Egg-Derived Products   . Hydrocodone     nausea  . Peanut-Containing Drug Products     And all nuts  . Percocet [Oxycodone-Acetaminophen]     nausea    Patient Measurements: Height: 6\' 1"  (185.4 cm) Weight: 233 lb (105.688 kg) IBW/kg (Calculated) : 79.9 Heparin Dosing Weight: 101.6 kg  Vital Signs: Temp: 97.4 F (36.3 C) (08/10 1451) Temp Source: Oral (08/10 1451) BP: 101/56 mmHg (08/10 1451) Pulse Rate: 62 (08/10 1451)  Labs:  Recent Labs  11/05/14 2116 11/06/14 0430 11/06/14 0814 11/06/14 1227 11/06/14 1607  HGB 12.6*  --   --   --   --   HCT 36.7*  --   --   --   --   PLT 211  --   --   --   --   HEPARINUNFRC  --   --   --   --  0.17*  CREATININE 1.35*  --  1.23  --   --   TROPONINI  --  <0.03  --  <0.03  --     Estimated Creatinine Clearance: 73.3 mL/min (by C-G formula based on Cr of 1.23).  Assessment:  Initial heparin level is subtherapeutic (0.17) on 1300 units/hr.  No infusion problems.  Enzymes negative, for cath in am.  Goal of Therapy:  Heparin level 0.3-0.7 units/ml Monitor platelets by anticoagulation protocol: Yes   Plan:   Re-bolus with heparin 3000 units IV.  Increase heparin drip to 1600 units/hr.  Next heparin level and CBC in am.  Dennie Fetters, RPh Pager: [phone] 11/06/2014,6:05 PM

## 2014-11-06 NOTE — H&P (Signed)
Patrick Duran is an 69 y.o. male.   Chief Complaint: Chest pain HPI: Patrick Duran  is a 69 y.o. male  With known coronary artery disease, history of drug-eluting stent implantation 4 into the large right coronary artery on 04/15/2014. He has history of hypertension, hyperlipidemia and diabetes mellitus.  Patient states that for the past 3 weeks he's been having exertional chest tightness. He has been using sublingual nitroglycerin fairly frequently. Yesterday he had severe pain that was not treated with sublingual nitroglycerin, he activated the EMS and presented to the hospital. This was associated with marked dyspnea and also diaphoresis.  Over the past 3 weeks patient is also noticed worsening dyspnea on exertion even with minimal activities. He denies any neurologic deficits. Denies symptoms of claudication. No leg edema, he has noticed orthopnea but no PND. No hemoptysis. No recent long travel.  Past Medical History  Diagnosis Date  . Arthritis   . GERD (gastroesophageal reflux disease)   . Diabetes mellitus   . Hyperlipidemia   . Hypertension   . Coronary artery disease   . Vertigo   . Hearing loss     bilateral  . Trouble swallowing   . Easy bruising     Past Surgical History  Procedure Laterality Date  . Cholecystectomy    . Cardiac catheterization  2000  . Spine surgery  2004    cervical fusion  . Left heart catheterization with coronary angiogram N/A 04/15/2014    Procedure: LEFT HEART CATHETERIZATION WITH CORONARY ANGIOGRAM;  Surgeon: Laverda Page, MD;  Location: [facility] CATH LAB;  Service: Cardiovascular;  Laterality: N/A;  . Cardiac catheterization  04/15/2014    Procedure: CORONARY STENT INTERVENTION;  Surgeon: Laverda Page, MD;  Location: [facility] CATH LAB;  Service: Cardiovascular;;  RCA    Family History  Problem Relation Age of Onset  . Diabetes Mother   . Hypothyroidism Mother    Social History:  reports that he has quit smoking. He has never used  smokeless tobacco. He reports that he drinks alcohol. He reports that he does not use illicit drugs.  Allergies:  Allergies  Allergen Reactions  . Codeine Sulfate     REACTION: nausea  . Eggs Or Egg-Derived Products   . Hydrocodone     nausea  . Peanut-Containing Drug Products     And all nuts  . Percocet [Oxycodone-Acetaminophen]     nausea    Review of Systems - Negative except Diabetes mellitus, uncontrolled, obesity, shortness of breath and chest pain  Blood pressure 109/61, pulse 64, temperature 98.7 F (37.1 C), temperature source Oral, resp. rate 18, height 6' 1"  (1.854 m), weight 105.688 kg (233 lb), SpO2 98 %. General appearance: alert, appears stated age, no distress and moderately obese Eyes: negative findings: lids and lashes normal and wears glasses Neck: no adenopathy, no carotid bruit, no JVD, supple, symmetrical, trachea midline and thyroid not enlarged, symmetric, no tenderness/mass/nodules Neck: JVP - normal, carotids 2+= without bruits Resp: clear to auscultation bilaterally Chest wall: no tenderness Cardio: regular rate and rhythm, S1, S2 normal, no murmur, click, rub or gallop GI: soft, non-tender; bowel sounds normal; no masses,  no organomegaly and obese Extremities: extremities normal, atraumatic, no cyanosis or edema Pulses: 2+ and symmetric Skin: Skin color, texture, turgor normal. No rashes or lesions Neurologic: Grossly normal  Results for orders placed or performed during the hospital encounter of 11/05/14 (from the past 48 hour(s))  Basic metabolic panel     Status: Abnormal  Collection Time: 11/05/14  9:16 PM  Result Value Ref Range   Sodium 137 135 - 145 mmol/L   Potassium 4.4 3.5 - 5.1 mmol/L   Chloride 103 101 - 111 mmol/L   CO2 25 22 - 32 mmol/L   Glucose, Bld 152 (H) 65 - 99 mg/dL   BUN 18 6 - 20 mg/dL   Creatinine, Ser 1.35 (H) 0.61 - 1.24 mg/dL   Calcium 9.6 8.9 - 10.3 mg/dL   GFR calc non Af Amer 52 (L) >60 mL/min   GFR calc Af  Amer >60 >60 mL/min    Comment: (NOTE) The eGFR has been calculated using the CKD EPI equation. This calculation has not been validated in all clinical situations. eGFR's persistently <60 mL/min signify possible Chronic Kidney Disease.    Anion gap 9 5 - 15  CBC     Status: Abnormal   Collection Time: 11/05/14  9:16 PM  Result Value Ref Range   WBC 7.4 4.0 - 10.5 K/uL   RBC 4.00 (L) 4.22 - 5.81 MIL/uL   Hemoglobin 12.6 (L) 13.0 - 17.0 g/dL   HCT 36.7 (L) 39.0 - 52.0 %   MCV 91.8 78.0 - 100.0 fL   MCH 31.5 26.0 - 34.0 pg   MCHC 34.3 30.0 - 36.0 g/dL   RDW 13.0 11.5 - 15.5 %   Platelets 211 150 - 400 K/uL  Brain natriuretic peptide     Status: None   Collection Time: 11/05/14  9:16 PM  Result Value Ref Range   B Natriuretic Peptide 85.5 0.0 - 100.0 pg/mL  I-stat troponin, ED     Status: None   Collection Time: 11/05/14  9:20 PM  Result Value Ref Range   Troponin i, poc 0.00 0.00 - 0.08 ng/mL   Comment 3            Comment: Due to the release kinetics of cTnI, a negative result within the first hours of the onset of symptoms does not rule out myocardial infarction with certainty. If myocardial infarction is still suspected, repeat the test at appropriate intervals.   CBG monitoring, ED     Status: None   Collection Time: 11/06/14  3:41 AM  Result Value Ref Range   Glucose-Capillary 94 65 - 99 mg/dL  Troponin I     Status: None   Collection Time: 11/06/14  4:30 AM  Result Value Ref Range   Troponin I <0.03 <0.031 ng/mL    Comment:        NO INDICATION OF MYOCARDIAL INJURY.   Glucose, capillary     Status: Abnormal   Collection Time: 11/06/14  6:18 AM  Result Value Ref Range   Glucose-Capillary 126 (H) 65 - 99 mg/dL   Comment 1 Notify RN    Dg Chest 2 View  11/05/2014   CLINICAL DATA:  Progressive chest pain  EXAM: CHEST  2 VIEW  COMPARISON:  April 13, 2014  FINDINGS: There is mild scarring in the left base. Lungs elsewhere clear. Heart is upper normal in size with  pulmonary vascularity within normal limits. There are coronary artery stents apparent. No adenopathy. There is postoperative change in the lower cervical spine.  IMPRESSION: Scarring left base. No edema or consolidation. No change in cardiac silhouette.   Electronically Signed   By: Lowella Grip III M.D.   On: 11/05/2014 21:52    Labs:   Lab Results  Component Value Date   WBC 7.4 11/05/2014   HGB 12.6* 11/05/2014  HCT 36.7* 11/05/2014   MCV 91.8 11/05/2014   PLT 211 11/05/2014    Recent Labs Lab 11/05/14 2116  NA 137  K 4.4  CL 103  CO2 25  BUN 18  CREATININE 1.35*  CALCIUM 9.6  GLUCOSE 152*    Lipid Panel     Component Value Date/Time   CHOL 167 04/14/2014 0429   TRIG 341* 04/14/2014 0429   HDL 23* 04/14/2014 0429   CHOLHDL 7.3 04/14/2014 0429   VLDL 68* 04/14/2014 0429   LDLCALC 76 04/14/2014 0429    BNP (last 3 results)  Recent Labs  11/05/14 2116  BNP 85.5    ProBNP (last 3 results) No results for input(s): PROBNP in the last 8760 hours.  HEMOGLOBIN A1C Lab Results  Component Value Date   HGBA1C 7.0* 04/13/2014   MPG 154* 04/13/2014    Cardiac Panel (last 3 results)  Recent Labs  04/13/14 2200 04/14/14 0429 11/06/14 0430  TROPONINI 0.20* 0.15* <0.03    Lab Results  Component Value Date   TROPONINI <0.03 11/06/2014     TSH No results for input(s): TSH in the last 8760 hours.  EKG: normal EKG, normal sinus rhythm, unchanged from previous tracings.  Medications Prior to Admission  Medication Sig Dispense Refill  . acetaminophen (TYLENOL) 500 MG tablet Take 500 mg by mouth every 6 (six) hours as needed. For pain    . Alpha-Lipoic Acid 300 MG CAPS Take by mouth daily.      Marland Kitchen aspirin EC 81 MG EC tablet Take 1 tablet (81 mg total) by mouth daily. 30 tablet 0  . atorvastatin (LIPITOR) 40 MG tablet Take 40 mg by mouth daily.     . carvedilol (COREG) 12.5 MG tablet Take 12.5 mg by mouth 2 (two) times daily with a meal.    . co-enzyme  Q-10 30 MG capsule Take 120 mg by mouth daily.      . fenofibrate micronized (LOFIBRA) 134 MG capsule Take 134 mg by mouth daily before breakfast.    . fish oil-omega-3 fatty acids 1000 MG capsule Take 2 g by mouth daily.      . Flaxseed, Linseed, (FLAXSEED OIL PO) Take by mouth daily.      Marland Kitchen glipiZIDE (GLUCOTROL XL) 10 MG 24 hr tablet Take 10 mg by mouth 2 (two) times daily.     . Grape Seed OIL Take 1 tablet by mouth daily.     . Insulin Glargine (LANTUS) 100 UNIT/ML Solostar Pen Inject 20 Units into the skin daily at 10 pm.     . levothyroxine (SYNTHROID, LEVOTHROID) 75 MCG tablet Take 75 mcg by mouth daily before breakfast.    . lisinopril-hydrochlorothiazide (PRINZIDE,ZESTORETIC) 20-25 MG per tablet Take 1 tablet by mouth daily.    . metFORMIN (GLUCOPHAGE) 1000 MG tablet Take 1,000 mg by mouth 2 (two) times daily with a meal.      . Multiple Vitamin (MULTIVITAMIN PO) Take 1 tablet by mouth daily.     . niacin 100 MG tablet Take 100 mg by mouth daily.    . pantoprazole (PROTONIX) 40 MG tablet Take 40 mg by mouth daily.     Marland Kitchen POTASSIUM GLUCONATE PO Take 1 tablet by mouth daily.     . ticagrelor (BRILINTA) 90 MG TABS tablet Take 1 tablet (90 mg total) by mouth 2 (two) times daily. 60 tablet 3  . vitamin B-12 (CYANOCOBALAMIN) 100 MCG tablet Take 50 mcg by mouth daily.      Marland Kitchen Zinc  Acetate, Oral, (ZINC ACETATE PO) Take 1 tablet by mouth daily.     . sucralfate (CARAFATE) 1 G tablet Take 1 tablet (1 g total) by mouth 4 (four) times daily. (Patient not taking: Reported on 04/13/2014) 12 tablet 0    Current facility-administered medications:  .  acetaminophen (TYLENOL) tablet 650 mg, 650 mg, Oral, Q4H PRN, Adrian Prows, MD, 650 mg at 11/06/14 0903 .  aspirin EC tablet 81 mg, 81 mg, Oral, Daily, Adrian Prows, MD .  atorvastatin (LIPITOR) tablet 40 mg, 40 mg, Oral, q1800, Adrian Prows, MD .  heparin ADULT infusion 100 units/mL (25000 units/250 mL), 1,300 Units/hr, Intravenous, Continuous, Eudelia Bunch,  RPH, Last Rate: 13 mL/hr at 11/06/14 0851, 1,300 Units/hr at 11/06/14 0851 .  lisinopril (PRINIVIL,ZESTRIL) tablet 20 mg, 20 mg, Oral, Daily **AND** hydrochlorothiazide (HYDRODIURIL) tablet 25 mg, 25 mg, Oral, Daily, Adrian Prows, MD .  insulin aspart (novoLOG) injection 0-15 Units, 0-15 Units, Subcutaneous, TID WC, Adrian Prows, MD, 0 Units at 11/06/14 0700 .  levothyroxine (SYNTHROID, LEVOTHROID) tablet 75 mcg, 75 mcg, Oral, QAC breakfast, Adrian Prows, MD, 75 mcg at 11/06/14 0850 .  nitroGLYCERIN (NITROSTAT) SL tablet 0.4 mg, 0.4 mg, Sublingual, Q5 Min x 3 PRN, Adrian Prows, MD .  nitroGLYCERIN 50 mg in dextrose 5 % 250 mL (0.2 mg/mL) infusion, 0-200 mcg/min, Intravenous, Continuous, Adrian Prows, MD, Last Rate: 4.5 mL/hr at 11/06/14 0609, 15 mcg/min at 11/06/14 0609 .  ondansetron (ZOFRAN) injection 4 mg, 4 mg, Intravenous, Q6H PRN, Adrian Prows, MD .  pantoprazole (PROTONIX) EC tablet 40 mg, 40 mg, Oral, Daily, Adrian Prows, MD .  ticagrelor (BRILINTA) tablet 90 mg, 90 mg, Oral, BID, Adrian Prows, MD .  zolpidem (AMBIEN) tablet 5 mg, 5 mg, Oral, QHS PRN, Adrian Prows, MD    Assessment/Plan 1. Unstable angina pectoris 2. Coronary artery disease involving the native vessel, history of PTCA and stenting of the mid and distal RCA for in-stent restenosis with implantation of overlapping stents, from distal to proximal a 2.5 x 16 mm Synergy, 3.0 x 32 mm overlapping with a 3.0 x 32 mm and a 3.0 x 20 mm Synergy stents on 04/15/2014 for in-stent restenosis. 3. Diabetes mellitus type 2 uncontrolled 4. Shortness of breath and dyspnea on fashion 5. Hypertension 6. Hypercholesterolemia  Recommendation: Patient having significant symptoms of chest pain suggestive of unstable angina pectoris. Some of the symptoms of chest discomfort on taking deep breath or leaning forward also suggests [place] skeletal etiology however nitrate responsiveness, associated shortness of breath and diaphoresis makes it likely that it is unstable angina  pectoris although the EKG does not reveal any significant abnormalities. I will admit the patient for unstable angina pectoris, due to scheduling issues, I scheduled him for coronary angiography first thing in the morning. Patient's wife present the bedside.  Discussed risks, benefits and alternatives of angiogram including but not limited to <1% risk of death, stroke, MI, need for urgent surgical revascularization, renal failure, but not limited to thest. patient is willing to proceed.   Adrian Prows, MD 11/06/2014, 9:09 AM [facility] [place] Pager: [phone] Office: [phone] If no answer: Cell:  [phone]

## 2014-11-06 NOTE — Progress Notes (Signed)
ANTICOAGULATION CONSULT NOTE - Initial Consult  Pharmacy Consult for heparin  Indication: chest pain/ACS  Allergies  Allergen Reactions  . Codeine Sulfate     REACTION: nausea  . Eggs Or Egg-Derived Products   . Hydrocodone     nausea  . Peanut-Containing Drug Products     And all nuts  . Percocet [Oxycodone-Acetaminophen]     nausea    Patient Measurements: Height:  (185.4 cm) Weight: 233 lb (105.688 kg) IBW/kg (Calculated) : 79.9 Heparin Dosing Weight: 101.6  Vital Signs: Temp: 98.7 F (37.1 C) (08/10 0415) Temp Source: Oral (08/10 0415) BP: 109/61 mmHg (08/10 0613) Pulse Rate: 64 (08/10 0613)  Labs:  Recent Labs  11/05/14 2116 11/06/14 0430  HGB 12.6*  --   HCT 36.7*  --   PLT 211  --   CREATININE 1.35*  --   TROPONINI  --  <0.03    Estimated Creatinine Clearance: 66.8 mL/min (by C-G formula based on Cr of 1.35).   Medical History: Past Medical History  Diagnosis Date  . Arthritis   . GERD (gastroesophageal reflux disease)   . Diabetes mellitus   . Hyperlipidemia   . Hypertension   . Coronary artery disease   . Vertigo   . Hearing loss     bilateral  . Trouble swallowing   . Easy bruising     Medications:  Prescriptions prior to admission  Medication Sig Dispense Refill Last Dose  . acetaminophen (TYLENOL) 500 MG tablet Take 500 mg by mouth every 6 (six) hours as needed. For pain   11/04/2014 at Unknown time  . Alpha-Lipoic Acid 300 MG CAPS Take by mouth daily.     11/05/2014 at Unknown time  . aspirin EC 81 MG EC tablet Take 1 tablet (81 mg total) by mouth daily. 30 tablet 0 11/05/2014 at Unknown time  . atorvastatin (LIPITOR) 40 MG tablet Take 40 mg by mouth daily.    11/05/2014 at Unknown time  . carvedilol (COREG) 12.5 MG tablet Take 12.5 mg by mouth 2 (two) times daily with a meal.   11/05/2014 at 0700  . co-enzyme Q-10 30 MG capsule Take 120 mg by mouth daily.     11/05/2014 at Unknown time  . fenofibrate micronized (LOFIBRA) 134 MG capsule  Take 134 mg by mouth daily before breakfast.   11/05/2014 at Unknown time  . fish oil-omega-3 fatty acids 1000 MG capsule Take 2 g by mouth daily.     11/05/2014 at Unknown time  . Flaxseed, Linseed, (FLAXSEED OIL PO) Take by mouth daily.     11/05/2014 at Unknown time  . glipiZIDE (GLUCOTROL XL) 10 MG 24 hr tablet Take 10 mg by mouth 2 (two) times daily.    11/05/2014 at Unknown time  . Grape Seed OIL Take 1 tablet by mouth daily.    11/05/2014 at Unknown time  . Insulin Glargine (LANTUS) 100 UNIT/ML Solostar Pen Inject 20 Units into the skin daily at 10 pm.    11/05/2014 at Unknown time  . levothyroxine (SYNTHROID, LEVOTHROID) 75 MCG tablet Take 75 mcg by mouth daily before breakfast.   11/05/2014 at Unknown time  . lisinopril-hydrochlorothiazide (PRINZIDE,ZESTORETIC) 20-25 MG per tablet Take 1 tablet by mouth daily.   11/05/2014 at Unknown time  . metFORMIN (GLUCOPHAGE) 1000 MG tablet Take 1,000 mg by mouth 2 (two) times daily with a meal.     11/05/2014 at Unknown time  . Multiple Vitamin (MULTIVITAMIN PO) Take 1 tablet by mouth daily.  11/05/2014 at Unknown time  . niacin 100 MG tablet Take 100 mg by mouth daily.   11/05/2014 at Unknown time  . pantoprazole (PROTONIX) 40 MG tablet Take 40 mg by mouth daily.    11/05/2014 at Unknown time  . POTASSIUM GLUCONATE PO Take 1 tablet by mouth daily.    11/05/2014 at Unknown time  . ticagrelor (BRILINTA) 90 MG TABS tablet Take 1 tablet (90 mg total) by mouth 2 (two) times daily. 60 tablet 3 11/05/2014 at Unknown time  . vitamin B-12 (CYANOCOBALAMIN) 100 MCG tablet Take 50 mcg by mouth daily.     11/05/2014 at Unknown time  . Zinc Acetate, Oral, (ZINC ACETATE PO) Take 1 tablet by mouth daily.    11/05/2014 at Unknown time  . sucralfate (CARAFATE) 1 G tablet Take 1 tablet (1 g total) by mouth 4 (four) times daily. (Patient not taking: Reported on 04/13/2014) 12 tablet 0     Assessment: 69 yo M with chest pain to start heparin per pharmacy protocol. Wt 106.7 HDW 101.6 kg.  CBC OK.  Creat 1.35. Troponin negative x 2.   Goal of Therapy:  Heparin level 0.3-0.7 units/ml Monitor platelets by anticoagulation protocol: Yes   Plan:  Give 4000 units bolus x 1 Start heparin infusion at 1300 units/hr Check anti-Xa level in 6 hours and daily while on heparin Continue to monitor H&H and platelets  Herby Abraham, Pharm.D. [phone] 11/06/2014 7:05 AM

## 2014-11-06 NOTE — Progress Notes (Signed)
Pt c/o of chest pain at 0600 radiating to the right arm with a rating of 6, nitro drip increased to by the charge Rn Tobi Bastos) at [facility] done, read Sinus Rhythm, pt later verbalized no pain at 0613, Dr Jacinto Halim paged and notified, said will see pt this morning, pt reassured, v/s stable. Obasogie-Asidi, Shakiyla Kook Efe

## 2014-11-06 NOTE — ED Notes (Signed)
Pt CBG is 94. Nurse notified. 

## 2014-11-06 NOTE — Progress Notes (Signed)
Utilization review completed.  

## 2014-11-06 NOTE — Progress Notes (Signed)
Dr Jacinto Halim paged twice for orders,said to continue iv Nitroglycerin at 96mcg/min as the previous order was expired and to order pt's home medications, pharmacy paged to assist with re ordering of pt's meds,advised to page the doctor back so that he can re order the medications himself to avoid mistakes, same done but Dr Jacinto Halim said to hold off on the home medications, will however continue to monitor pt. Obasogie-Asidi, Lorel Lembo Efe

## 2014-11-07 ENCOUNTER — Encounter (HOSPITAL_COMMUNITY)
Admission: EM | Disposition: A | Discharge status: Home / Self Care | Payer: Self-pay | Source: Home / Self Care | Attending: Cardiology

## 2014-11-07 ENCOUNTER — Other Ambulatory Visit: Payer: Self-pay

## 2014-11-07 ENCOUNTER — Encounter (HOSPITAL_COMMUNITY): Payer: Self-pay | Admitting: General Practice

## 2014-11-07 HISTORY — PX: CARDIAC CATHETERIZATION: SHX172

## 2014-11-07 LAB — CBC
HCT: 31.7 % — ABNORMAL LOW (ref 39.0–52.0)
Hemoglobin: 11 g/dL — ABNORMAL LOW (ref 13.0–17.0)
MCH: 31.3 pg (ref 26.0–34.0)
MCHC: 34.7 g/dL (ref 30.0–36.0)
MCV: 90.1 fL (ref 78.0–100.0)
Platelets: 161 10*3/uL (ref 150–400)
RBC: 3.52 MIL/uL — ABNORMAL LOW (ref 4.22–5.81)
RDW: 13.2 % (ref 11.5–15.5)
WBC: 5.5 10*3/uL (ref 4.0–10.5)

## 2014-11-07 LAB — POCT ACTIVATED CLOTTING TIME
Activated Clotting Time: 227 seconds
Activated Clotting Time: 503 seconds

## 2014-11-07 LAB — GLUCOSE, CAPILLARY
Glucose-Capillary: 110 mg/dL — ABNORMAL HIGH (ref 65–99)
Glucose-Capillary: 111 mg/dL — ABNORMAL HIGH (ref 65–99)
Glucose-Capillary: 117 mg/dL — ABNORMAL HIGH (ref 65–99)
Glucose-Capillary: 140 mg/dL — ABNORMAL HIGH (ref 65–99)

## 2014-11-07 LAB — PROTIME-INR
INR: 1.29 (ref 0.00–1.49)
Prothrombin Time: 16.2 seconds — ABNORMAL HIGH (ref 11.6–15.2)

## 2014-11-07 SURGERY — LEFT HEART CATH AND CORONARY ANGIOGRAPHY

## 2014-11-07 MED ORDER — HYDROMORPHONE HCL 1 MG/ML IJ SOLN
INTRAMUSCULAR | Status: AC
  Filled 2014-11-07: qty 1

## 2014-11-07 MED ORDER — OFF THE BEAT BOOK
Freq: Once | Status: AC
  Administered 2014-11-07: 21:00:00
  Filled 2014-11-07: qty 1

## 2014-11-07 MED ORDER — NITROGLYCERIN 1 MG/10 ML FOR IR/CATH LAB
INTRA_ARTERIAL | Status: AC
Start: 2014-11-07 — End: 2014-11-07
  Filled 2014-11-07: qty 10

## 2014-11-07 MED ORDER — HEPARIN (PORCINE) IN NACL 2-0.9 UNIT/ML-% IJ SOLN
INTRAMUSCULAR | Status: AC
  Filled 2014-11-07: qty 1000

## 2014-11-07 MED ORDER — HEPARIN SODIUM (PORCINE) 1000 UNIT/ML IJ SOLN
INTRAMUSCULAR | Status: AC
  Filled 2014-11-07: qty 1

## 2014-11-07 MED ORDER — DIPHENHYDRAMINE HCL 50 MG/ML IJ SOLN
INTRAMUSCULAR | Status: DC | PRN
  Administered 2014-11-07: 12.5 mg via INTRAVENOUS

## 2014-11-07 MED ORDER — DIPHENHYDRAMINE HCL 50 MG/ML IJ SOLN
INTRAMUSCULAR | Status: AC
  Filled 2014-11-07: qty 1

## 2014-11-07 MED ORDER — MIDAZOLAM HCL 2 MG/2ML IJ SOLN
INTRAMUSCULAR | Status: DC | PRN
  Administered 2014-11-07: 2 mg via INTRAVENOUS

## 2014-11-07 MED ORDER — METOPROLOL TARTRATE 25 MG PO TABS
50.0000 mg | ORAL_TABLET | Freq: Two times a day (BID) | ORAL | Status: DC
  Administered 2014-11-07 – 2014-11-08 (×2): 50 mg via ORAL
  Filled 2014-11-07 (×2): qty 2

## 2014-11-07 MED ORDER — VERAPAMIL HCL 2.5 MG/ML IV SOLN
INTRAVENOUS | Status: AC
  Filled 2014-11-07: qty 2

## 2014-11-07 MED ORDER — ONDANSETRON HCL 4 MG/2ML IJ SOLN
INTRAMUSCULAR | Status: DC | PRN
  Administered 2014-11-07: 4 mg via INTRAVENOUS

## 2014-11-07 MED ORDER — MIDAZOLAM HCL 2 MG/2ML IJ SOLN
INTRAMUSCULAR | Status: AC
  Filled 2014-11-07: qty 4

## 2014-11-07 MED ORDER — IOHEXOL 350 MG/ML SOLN
INTRAVENOUS | Status: DC | PRN
  Administered 2014-11-07: 105 mL via INTRAVENOUS

## 2014-11-07 MED ORDER — BIVALIRUDIN BOLUS VIA INFUSION - CUPID
INTRAVENOUS | Status: DC | PRN
  Administered 2014-11-07: 79.275 mg via INTRAVENOUS

## 2014-11-07 MED ORDER — OFF THE BEAT BOOK
Freq: Once | Status: AC
  Administered 2014-11-07: 19:00:00
  Filled 2014-11-07: qty 1

## 2014-11-07 MED ORDER — ACETAMINOPHEN 325 MG PO TABS: 650.0000 mg | ORAL_TABLET | ORAL | Status: DC | PRN

## 2014-11-07 MED ORDER — BIVALIRUDIN 250 MG IV SOLR
INTRAVENOUS | Status: AC
  Filled 2014-11-07: qty 250

## 2014-11-07 MED ORDER — FAMOTIDINE IN NACL 20-0.9 MG/50ML-% IV SOLN
INTRAVENOUS | Status: DC | PRN
  Administered 2014-11-07: 20 mg via INTRAVENOUS

## 2014-11-07 MED ORDER — SODIUM CHLORIDE 0.9 % IJ SOLN: 3.0000 mL | INTRAMUSCULAR | Status: DC | PRN

## 2014-11-07 MED ORDER — SODIUM CHLORIDE 0.9 % IV SOLN: 250.0000 mL | INTRAVENOUS | Status: DC | PRN

## 2014-11-07 MED ORDER — SODIUM CHLORIDE 0.9 % WEIGHT BASED INFUSION
3.0000 mL/kg/h | INTRAVENOUS | Status: AC
Start: 2014-11-07 — End: 2014-11-07

## 2014-11-07 MED ORDER — LIDOCAINE HCL (PF) 1 % IJ SOLN
INTRAMUSCULAR | Status: AC
  Filled 2014-11-07: qty 30

## 2014-11-07 MED ORDER — HYDROMORPHONE HCL 1 MG/ML IJ SOLN
INTRAMUSCULAR | Status: DC | PRN
  Administered 2014-11-07: 0.5 mg via INTRAVENOUS

## 2014-11-07 MED ORDER — BIVALIRUDIN 250 MG IV SOLR
250.0000 mg | INTRAVENOUS | Status: DC | PRN
  Administered 2014-11-07: 1.75 mg/kg/h via INTRAVENOUS

## 2014-11-07 MED ORDER — SODIUM CHLORIDE 0.9 % IJ SOLN
3.0000 mL | Freq: Two times a day (BID) | INTRAMUSCULAR | Status: DC
  Administered 2014-11-08: 3 mL via INTRAVENOUS

## 2014-11-07 MED ORDER — NITROGLYCERIN 1 MG/10 ML FOR IR/CATH LAB
INTRA_ARTERIAL | Status: DC | PRN
  Administered 2014-11-07 (×2): 200 ug

## 2014-11-07 MED ORDER — RADIAL COCKTAIL (HEPARIN/VERAPAMIL/LIDOCAINE/NITRO)
Status: DC | PRN
  Administered 2014-11-07: .5 via INTRA_ARTERIAL

## 2014-11-07 MED ORDER — FAMOTIDINE IN NACL 20-0.9 MG/50ML-% IV SOLN
INTRAVENOUS | Status: AC
  Filled 2014-11-07: qty 50

## 2014-11-07 MED ORDER — ONDANSETRON HCL 4 MG/2ML IJ SOLN
INTRAMUSCULAR | Status: AC
  Filled 2014-11-07: qty 2

## 2014-11-07 MED ORDER — METOPROLOL TARTRATE 1 MG/ML IV SOLN
5.0000 mg | Freq: Once | INTRAVENOUS | Status: AC
  Administered 2014-11-07: 18:00:00 5 mg via INTRAVENOUS
  Filled 2014-11-07: qty 5

## 2014-11-07 MED ORDER — HEPARIN SODIUM (PORCINE) 1000 UNIT/ML IJ SOLN
INTRAMUSCULAR | Status: DC | PRN
  Administered 2014-11-07: 6000 [IU] via INTRAVENOUS

## 2014-11-07 MED ORDER — LIDOCAINE HCL (PF) 1 % IJ SOLN
INTRAMUSCULAR | Status: DC | PRN
  Administered 2014-11-07: 09:00:00

## 2014-11-07 SURGICAL SUPPLY — 19 items
BALLN ANGIOSCULPT RX 3.0X10 (BALLOONS) ×2
BALLN EUPHORA RX 3.0X12 (BALLOONS) ×2
BALLOON ANGIOSCULPT RX 3.0X10 (BALLOONS) ×1 IMPLANT
BALLOON EUPHORA RX 3.0X12 (BALLOONS) ×1 IMPLANT
CATH OPTITORQUE TIG 4.0 5F (CATHETERS) ×2 IMPLANT
COVER PRB 48X5XTLSCP FOLD TPE (BAG) ×1 IMPLANT
COVER PROBE 5X48 (BAG) ×1
DEVICE RAD COMP TR BAND LRG (VASCULAR PRODUCTS) ×2 IMPLANT
GLIDESHEATH SLEND A-KIT 6F 20G (SHEATH) ×2 IMPLANT
GUIDE CATH RUNWAY 6FR FR4 (CATHETERS) ×2 IMPLANT
KIT ENCORE 26 ADVANTAGE (KITS) ×2 IMPLANT
KIT HEART LEFT (KITS) ×2 IMPLANT
PACK CARDIAC CATHETERIZATION (CUSTOM PROCEDURE TRAY) ×2 IMPLANT
STENT RESOLUTE INTEG 3.5X26 (Permanent Stent) ×2 IMPLANT
TRANSDUCER W/STOPCOCK (MISCELLANEOUS) ×2 IMPLANT
TUBING CIL FLEX 10 FLL-RA (TUBING) ×2 IMPLANT
WIRE MAILMAN 182CM (WIRE) ×2 IMPLANT
WIRE RUNTHROUGH .014X180CM (WIRE) ×2 IMPLANT
WIRE SAFE-T 1.5MM-J .035X260CM (WIRE) ×2 IMPLANT

## 2014-11-07 NOTE — Progress Notes (Signed)
Patient found to be in atrial fibrillation with rapid ventricular response. Dr. Jacinto Halim notified; patient started on metoprolol with IV bolus.

## 2014-11-07 NOTE — Interval H&P Note (Signed)
History and Physical Interval Note:  11/07/2014 7:41 AM  Patrick Duran  has presented today for surgery, with the diagnosis of cp  The various methods of treatment have been discussed with the patient and family. After consideration of risks, benefits and other options for treatment, the patient has consented to  Procedure(s): Left Heart Cath and Coronary Angiography (N/A) and possible PCI as a surgical intervention .  The patient's history has been reviewed, patient examined, no change in status, stable for surgery.  I have reviewed the patient's chart and labs.  Questions were answered to the patient's satisfaction.   Cath Lab Visit (complete for each Cath Lab visit)  Clinical Evaluation Leading to the Procedure:   ACS: Yes.    Non-ACS:    Anginal Classification: CCS IV  Anti-ischemic medical therapy: Maximal Therapy (2 or more classes of medications)  Non-Invasive Test Results: No non-invasive testing performed  Prior CABG: No previous CABG        Yates Decamp

## 2014-11-08 ENCOUNTER — Encounter (HOSPITAL_COMMUNITY): Payer: Self-pay | Admitting: Cardiology

## 2014-11-08 LAB — BASIC METABOLIC PANEL
Anion gap: 7 (ref 5–15)
BUN: 12 mg/dL (ref 6–20)
CO2: 25 mmol/L (ref 22–32)
Calcium: 8.6 mg/dL — ABNORMAL LOW (ref 8.9–10.3)
Chloride: 108 mmol/L (ref 101–111)
Creatinine, Ser: 1.24 mg/dL (ref 0.61–1.24)
GFR calc Af Amer: >60 mL/min (ref >60)
GFR calc non Af Amer: 58 mL/min — ABNORMAL LOW (ref >60)
Glucose, Bld: 115 mg/dL — ABNORMAL HIGH (ref 65–99)
Potassium: 3.4 mmol/L — ABNORMAL LOW (ref 3.5–5.1)
Sodium: 140 mmol/L (ref 135–145)

## 2014-11-08 LAB — CBC
HCT: 32.6 % — ABNORMAL LOW (ref 39.0–52.0)
Hemoglobin: 10.9 g/dL — ABNORMAL LOW (ref 13.0–17.0)
MCH: 30.6 pg (ref 26.0–34.0)
MCHC: 33.4 g/dL (ref 30.0–36.0)
MCV: 91.6 fL (ref 78.0–100.0)
Platelets: 183 10*3/uL (ref 150–400)
RBC: 3.56 MIL/uL — ABNORMAL LOW (ref 4.22–5.81)
RDW: 13 % (ref 11.5–15.5)
WBC: 4.9 10*3/uL (ref 4.0–10.5)

## 2014-11-08 LAB — GLUCOSE, CAPILLARY: Glucose-Capillary: 116 mg/dL — ABNORMAL HIGH (ref 65–99)

## 2014-11-08 MED ORDER — CLOPIDOGREL BISULFATE 75 MG PO TABS
75.0000 mg | ORAL_TABLET | Freq: Every day | ORAL | Status: DC
  Administered 2014-11-08: 75 mg via ORAL
  Filled 2014-11-08: qty 1

## 2014-11-08 MED ORDER — CLOPIDOGREL BISULFATE 75 MG PO TABS: 75.0000 mg | ORAL_TABLET | Freq: Every day | ORAL | Status: DC

## 2014-11-08 MED ORDER — APIXABAN 5 MG PO TABS: 5.0000 mg | ORAL_TABLET | Freq: Two times a day (BID) | ORAL | Status: DC

## 2014-11-08 MED ORDER — METFORMIN HCL 1000 MG PO TABS: 1000.0000 mg | ORAL_TABLET | Freq: Two times a day (BID) | ORAL | Status: DC

## 2014-11-08 MED ORDER — APIXABAN 5 MG PO TABS
5.0000 mg | ORAL_TABLET | Freq: Two times a day (BID) | ORAL | Status: DC
  Administered 2014-11-08: 5 mg via ORAL
  Filled 2014-11-08: qty 1

## 2014-11-08 MED ORDER — METOPROLOL TARTRATE 50 MG PO TABS: 50.0000 mg | ORAL_TABLET | Freq: Two times a day (BID) | ORAL | Status: DC

## 2014-11-08 NOTE — Care Management Important Message (Signed)
Important Message  Patient Details  Name: Patrick Duran MRN: [id number] Date of Birth: 1945-09-10   Medicare Important Message Given:  Yes-second notification given    Orson Aloe 11/08/2014, 3:10 PM

## 2014-11-08 NOTE — Discharge Summary (Signed)
Physician Discharge Summary  Patient ID: Patrick Duran MRN: [id number] DOB/AGE: Aug 03, 1945 69 y.o.  Admit date: 11/05/2014 Discharge date: 11/08/2014  Primary Discharge Diagnosis 1. Unstable angina pectoris 2.  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 3.  Diabetes mellitus type 2 controlled 4.  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  Hyperlipidemia 6.  New onset atrial fibrillation on 11/07/2014. CHA2DS2-VASCScore: Risk Score  4,  Yearly risk of stroke  4. Recommendation: ASA No/Anticoagulation Yes  OAC Has Bled: Score 3.  Estimated risk of major bleeding at 1 year with OAC 4.9-19.6%.  7.  Hypertension.  Significant Diagnostic Studies: 11/07/2014:  Left heart catheterization including hemodynamic monitoring of the left ventricle, LV gram. Selective right and left coronary arteriography. PTCA and stenting of the mid RCA with a 3.5 x 26 mm resolute integrity DES. For a high-grade in-stent restenosis, diffuse disease.  Hospital Course: Patient admitted to the hospital after he presented with unstable angina pectoris, he was ruled out for myocardial infarction, however due to persistent chest pain, in spite of being on IV heparin and IV nitroglycerin, he was scheduled for coronary angiography the following morning, this revealed fairly complex severe diffuse in-stent restenosis of the previously placed mid RCA stent he underwent successful angioplasty of the same.  He did well post angioplasty, however in the evening, patient developed new onset of atrial fibrillation with rapid ventricular response, asymptomatic.  He was given IV metoprolol  And started on metoprolol, with the hope of discontinuing Coreg.  Overnight observation did not  reveal reversal to normal sinus rhythm, hence given his cardiovascular risk factors he was felt that he should be anticoagulated for primary prevention of stroke and cardioembolic phenomena.  As patient also has had recent stenting, I discontinued Brilinta and switched him to Plavix, added Eliquis 5 mg by mouth twice a day.  He was felt stable for discharge.  Recommendations on discharge: I will schedule him for elective direct current cardioversion as this is new onset of atrial fibrillation.  Scheduling in the hospital was not permissible for doing this prior to discharge.  Patient wanted to go home and did not want to stay over the weekend.  He understands the risks of bleeding complication with Eliquis along with a combination of clopidogrel.  I may consider switching him to Coumadin at a later date.  He has no history of any GI bleed and is low risk for GI bleed although he has chronic anemia. I have discontinued Coreg and switched him to metoprolol tartrate to improve heart rate control.  Discharge Exam: Blood pressure 128/69, pulse 114, temperature 97.6 F (36.4 C), temperature source Oral, resp. rate 20, height 6\' 1"  (1.854 m), weight 109.3 kg (240 lb 15.4 oz), SpO2 97 %.  General appearance: alert, appears stated age, no distress and moderately obese Eyes: negative findings: lids and lashes normal and wears glasses Neck: no adenopathy, no carotid bruit, no JVD, supple, symmetrical, trachea midline and thyroid not enlarged, symmetric, no tenderness/mass/nodules Neck: JVP - normal, carotids 2+= without bruits Resp: clear to auscultation bilaterally Chest wall: no tenderness Cardio: Irregular rate and rhythm, S1 variable, S2 normal, no murmur, click, rub or gallop GI: soft, non-tender; bowel sounds normal; no masses, no organomegaly and obese  Extremities: extremities normal, atraumatic, no cyanosis or edema Pulses: 2+ and symmetric. Right radial access without complications. Skin: Skin  color, texture, turgor normal. No rashes or lesions Neurologic: Grossly normal  Labs:   Lab Results  Component Value Date   WBC 4.9 11/08/2014   HGB 10.9* 11/08/2014   HCT 32.6* 11/08/2014   MCV 91.6 11/08/2014   PLT 183 11/08/2014    Recent Labs Lab 11/08/14 0350  NA 140  K 3.4*  CL 108  CO2 25  BUN 12  CREATININE 1.24  CALCIUM 8.6*  GLUCOSE 115*    Lipid Panel     Component Value Date/Time   CHOL 167 04/14/2014 0429   TRIG 341* 04/14/2014 0429   HDL 23* 04/14/2014 0429   CHOLHDL 7.3 04/14/2014 0429   VLDL 68* 04/14/2014 0429   LDLCALC 76 04/14/2014 0429    BNP (last 3 results)  Recent Labs  11/05/14 2116  BNP 85.5    ProBNP (last 3 results) No results for input(s): PROBNP in the last 8760 hours.  HEMOGLOBIN A1C Lab Results  Component Value Date   HGBA1C 7.0* 04/13/2014   MPG 154* 04/13/2014    Cardiac Panel (last 3 results)  Recent Labs  11/06/14 0430 11/06/14 1227 11/06/14 2116  TROPONINI <0.03 <0.03 <0.03    Lab Results  Component Value Date   TROPONINI <0.03 11/06/2014     TSH No results for input(s): TSH in the last 8760 hours.  11/07/2014: EKG: normal EKG, normal sinus rhythm, unchanged from previous   11/08/2014: Atrial fibrillation with controlled ventricular response.   Radiology: Dg Chest 2 View  11/05/2014   CLINICAL DATA:  Progressive chest pain  EXAM: CHEST  2 VIEW  COMPARISON:  April 13, 2014  FINDINGS: There is mild scarring in the left base. Lungs elsewhere clear. Heart is upper normal in size with pulmonary vascularity within normal limits. There are coronary artery stents apparent. No adenopathy. There is postoperative change in the lower cervical spine.  IMPRESSION: Scarring left base. No edema or consolidation. No change in cardiac silhouette.   Electronically Signed   By: Bretta Bang III M.D.   On: 11/05/2014 21:52   FOLLOW UP PLANS AND APPOINTMENTS    Medication List    STOP taking these medications         carvedilol 12.5 MG tablet  Commonly known as:  COREG     sucralfate 1 G tablet  Commonly known as:  CARAFATE     ticagrelor 90 MG Tabs tablet  Commonly known as:  BRILINTA      TAKE these medications        acetaminophen 500 MG tablet  Commonly known as:  TYLENOL  Take 500 mg by mouth every 6 (six) hours as needed. For pain     Alpha-Lipoic Acid 300 MG Caps  Take by mouth daily.     apixaban 5 MG Tabs tablet  Commonly known as:  ELIQUIS  Take 1 tablet (5 mg total) by mouth 2 (two) times daily.     aspirin 81 MG EC tablet  Take 1 tablet (81 mg total) by mouth daily.     atorvastatin 40 MG tablet  Commonly known as:  LIPITOR  Take 40 mg by mouth daily.     clopidogrel 75 MG tablet  Commonly known as:  PLAVIX  Take 1 tablet (75 mg total) by mouth daily.     co-enzyme Q-10 30 MG capsule  Take 120 mg by mouth daily.  fenofibrate micronized 134 MG capsule  Commonly known as:  LOFIBRA  Take 134 mg by mouth daily before breakfast.     fish oil-omega-3 fatty acids 1000 MG capsule  Take 2 g by mouth daily.     FLAXSEED OIL PO  Take by mouth daily.     glipiZIDE 10 MG 24 hr tablet  Commonly known as:  GLUCOTROL XL  Take 10 mg by mouth 2 (two) times daily.     Grape Seed Oil  Take 1 tablet by mouth daily.     Insulin Glargine 100 UNIT/ML Solostar Pen  Commonly known as:  LANTUS  Inject 20 Units into the skin daily at 10 pm.     levothyroxine 75 MCG tablet  Commonly known as:  SYNTHROID, LEVOTHROID  Take 75 mcg by mouth daily before breakfast.     lisinopril-hydrochlorothiazide 20-25 MG per tablet  Commonly known as:  PRINZIDE,ZESTORETIC  Take 1 tablet by mouth daily.     metFORMIN 1000 MG tablet  Commonly known as:  GLUCOPHAGE  Take 1 tablet (1,000 mg total) by mouth 2 (two) times daily with a meal.  Start taking on:  11/09/2014     metoprolol 50 MG tablet  Commonly known as:  LOPRESSOR  Take 1 tablet (50 mg total) by mouth 2 (two) times daily.      MULTIVITAMIN PO  Take 1 tablet by mouth daily.     niacin 100 MG tablet  Take 100 mg by mouth daily.     pantoprazole 40 MG tablet  Commonly known as:  PROTONIX  Take 40 mg by mouth daily.     POTASSIUM GLUCONATE PO  Take 1 tablet by mouth daily.     vitamin B-12 100 MCG tablet  Commonly known as:  CYANOCOBALAMIN  Take 50 mcg by mouth daily.     ZINC ACETATE PO  Take 1 tablet by mouth daily.          Yates Decamp, MD 11/08/2014, 9:55 AM  Pager: [phone] Office: [phone] If no answer: [phone]

## 2014-11-08 NOTE — Progress Notes (Signed)
CARDIAC REHAB PHASE I   PRE:  Rate/Rhythm: 93 afib    BP: sitting 125/75    SaO2:   MODE:  Ambulation: 450 ft   POST:  Rate/Rhythm: 132 max, mostly 110s    BP: sitting 128/69     SaO2:   Tolerated fair, pt steady and VSS but seemed to fatigue earlier than he did in January (walked 1000 ft then). Ed completed/reviewed. Pt was not able to do CRPII due to high copay in January. Gave him brochure to call CRPII if his insurance will pay more. Discussed Afib book and gave pt video to watch.  Pt understands importance of Plavix and Eliquis. [phone]  Elissa Lovett [place] CES, ACSM 11/08/2014 9:17 AM

## 2015-04-01 DIAGNOSIS — J219 Acute bronchiolitis, unspecified: Secondary | ICD-10-CM | POA: Diagnosis not present

## 2015-04-01 DIAGNOSIS — J0100 Acute maxillary sinusitis, unspecified: Secondary | ICD-10-CM | POA: Diagnosis not present

## 2015-04-29 DIAGNOSIS — E118 Type 2 diabetes mellitus with unspecified complications: Secondary | ICD-10-CM | POA: Diagnosis not present

## 2015-04-29 DIAGNOSIS — L84 Corns and callosities: Secondary | ICD-10-CM | POA: Diagnosis not present

## 2015-04-29 DIAGNOSIS — E782 Mixed hyperlipidemia: Secondary | ICD-10-CM | POA: Diagnosis not present

## 2015-04-29 DIAGNOSIS — I10 Essential (primary) hypertension: Secondary | ICD-10-CM | POA: Diagnosis not present

## 2015-04-29 DIAGNOSIS — E039 Hypothyroidism, unspecified: Secondary | ICD-10-CM | POA: Diagnosis not present

## 2015-07-30 ENCOUNTER — Telehealth: Payer: Self-pay | Admitting: Neurology

## 2015-07-30 NOTE — Telephone Encounter (Signed)
Called pt to make appt. Last seen 2015. He does not want to and does not want [place] to call him any more.

## 2015-07-31 DIAGNOSIS — E118 Type 2 diabetes mellitus with unspecified complications: Secondary | ICD-10-CM | POA: Diagnosis not present

## 2015-07-31 DIAGNOSIS — L03031 Cellulitis of right toe: Secondary | ICD-10-CM | POA: Diagnosis not present

## 2015-07-31 DIAGNOSIS — E1165 Type 2 diabetes mellitus with hyperglycemia: Secondary | ICD-10-CM | POA: Diagnosis not present

## 2015-08-15 DIAGNOSIS — I10 Essential (primary) hypertension: Secondary | ICD-10-CM | POA: Diagnosis not present

## 2015-08-15 DIAGNOSIS — E782 Mixed hyperlipidemia: Secondary | ICD-10-CM | POA: Diagnosis not present

## 2015-08-15 DIAGNOSIS — I2510 Atherosclerotic heart disease of native coronary artery without angina pectoris: Secondary | ICD-10-CM | POA: Diagnosis not present

## 2015-08-15 DIAGNOSIS — I480 Paroxysmal atrial fibrillation: Secondary | ICD-10-CM | POA: Diagnosis not present

## 2015-09-03 DIAGNOSIS — H903 Sensorineural hearing loss, bilateral: Secondary | ICD-10-CM | POA: Diagnosis not present

## 2015-09-09 ENCOUNTER — Other Ambulatory Visit (INDEPENDENT_AMBULATORY_CARE_PROVIDER_SITE_OTHER): Payer: Self-pay | Admitting: Otolaryngology

## 2015-09-09 DIAGNOSIS — H9192 Unspecified hearing loss, left ear: Secondary | ICD-10-CM

## 2015-09-19 ENCOUNTER — Other Ambulatory Visit (INDEPENDENT_AMBULATORY_CARE_PROVIDER_SITE_OTHER): Payer: Self-pay | Admitting: Otolaryngology

## 2015-09-19 DIAGNOSIS — H9190 Unspecified hearing loss, unspecified ear: Secondary | ICD-10-CM

## 2015-09-25 ENCOUNTER — Ambulatory Visit
Admission: RE | Admit: 2015-09-25 | Discharge: 2015-09-25 | Disposition: A | Discharge status: Home / Self Care | Payer: PPO | Source: Ambulatory Visit | Attending: Otolaryngology | Admitting: Otolaryngology

## 2015-09-25 DIAGNOSIS — H9319 Tinnitus, unspecified ear: Secondary | ICD-10-CM | POA: Diagnosis not present

## 2015-09-25 DIAGNOSIS — H9190 Unspecified hearing loss, unspecified ear: Secondary | ICD-10-CM

## 2015-10-06 DIAGNOSIS — H2513 Age-related nuclear cataract, bilateral: Secondary | ICD-10-CM | POA: Diagnosis not present

## 2015-10-06 DIAGNOSIS — E119 Type 2 diabetes mellitus without complications: Secondary | ICD-10-CM | POA: Diagnosis not present

## 2015-11-19 DIAGNOSIS — E782 Mixed hyperlipidemia: Secondary | ICD-10-CM | POA: Diagnosis not present

## 2015-11-19 DIAGNOSIS — E039 Hypothyroidism, unspecified: Secondary | ICD-10-CM | POA: Diagnosis not present

## 2015-11-19 DIAGNOSIS — I10 Essential (primary) hypertension: Secondary | ICD-10-CM | POA: Diagnosis not present

## 2015-11-19 DIAGNOSIS — E118 Type 2 diabetes mellitus with unspecified complications: Secondary | ICD-10-CM | POA: Diagnosis not present

## 2016-02-17 DIAGNOSIS — I2510 Atherosclerotic heart disease of native coronary artery without angina pectoris: Secondary | ICD-10-CM | POA: Diagnosis not present

## 2016-02-25 DIAGNOSIS — I2510 Atherosclerotic heart disease of native coronary artery without angina pectoris: Secondary | ICD-10-CM | POA: Diagnosis not present

## 2016-02-25 DIAGNOSIS — E782 Mixed hyperlipidemia: Secondary | ICD-10-CM | POA: Diagnosis not present

## 2016-02-25 DIAGNOSIS — I10 Essential (primary) hypertension: Secondary | ICD-10-CM | POA: Diagnosis not present

## 2016-02-25 DIAGNOSIS — I480 Paroxysmal atrial fibrillation: Secondary | ICD-10-CM | POA: Diagnosis not present

## 2016-03-02 IMAGING — CR DG CHEST 2V
3 series · 3 of 3 positions shown · non-contrast
Comparison: Prior chest x-ray 02/09/2011

CLINICAL DATA: 68-year-old male with shortness of breath, chest
pain, nausea and dizziness

EXAM:
CHEST  2 VIEW

[chest pa]
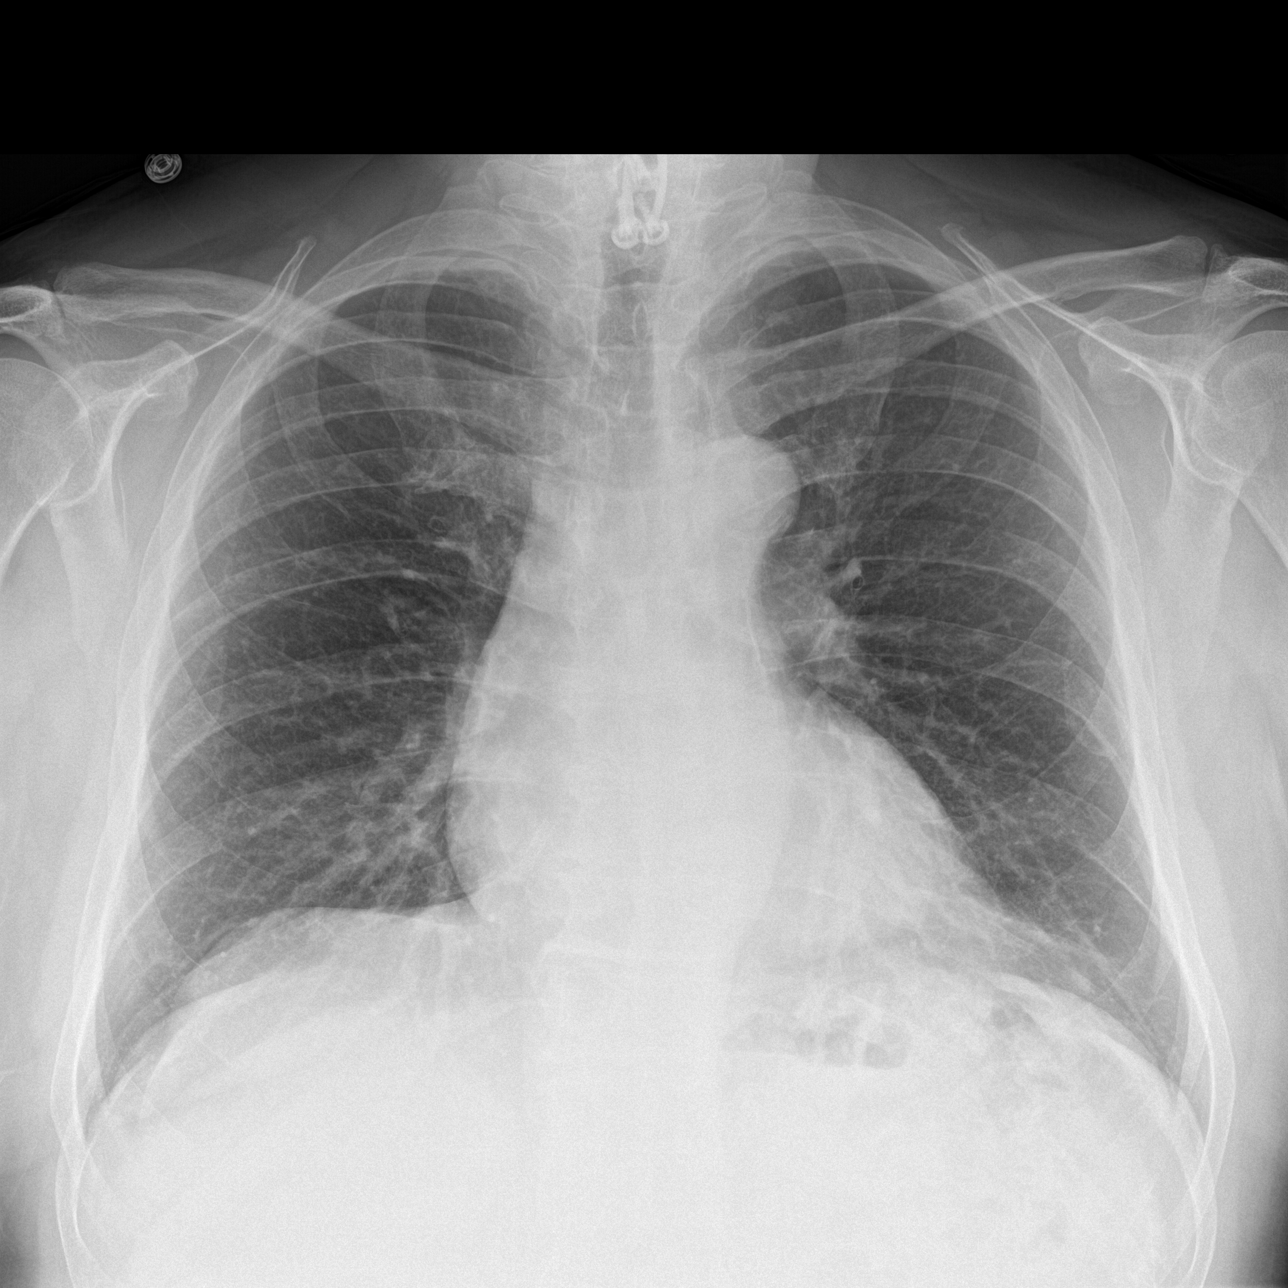

[chest lat (1 of 2)]
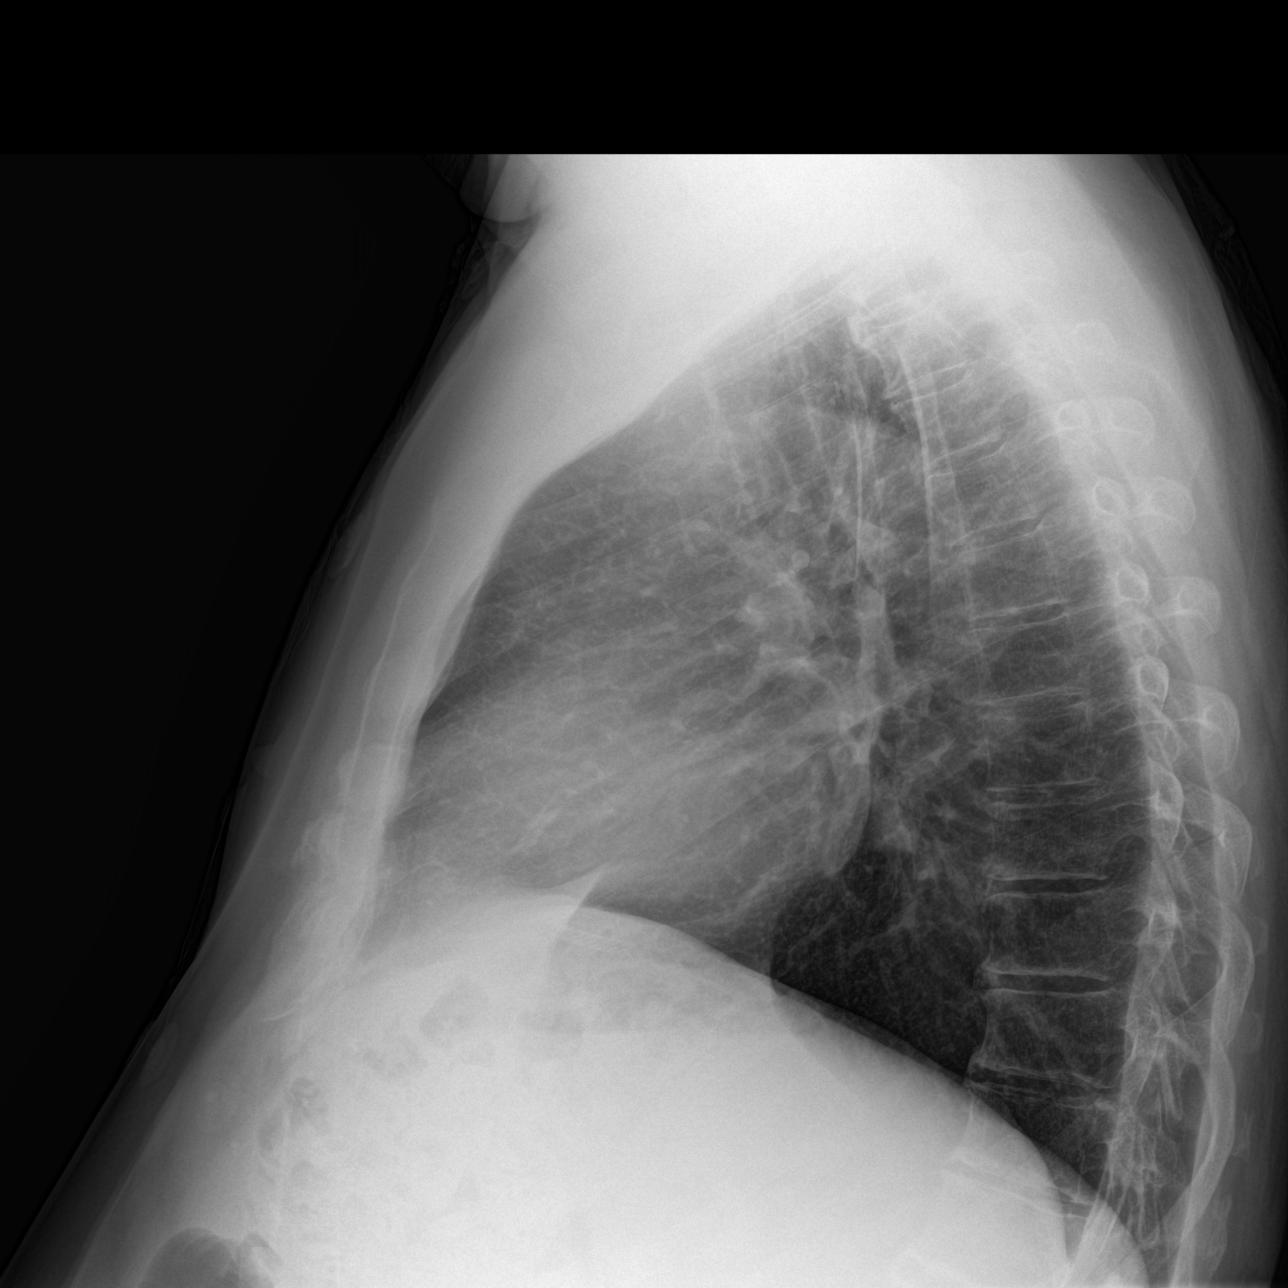

[chest lat (2 of 2)]
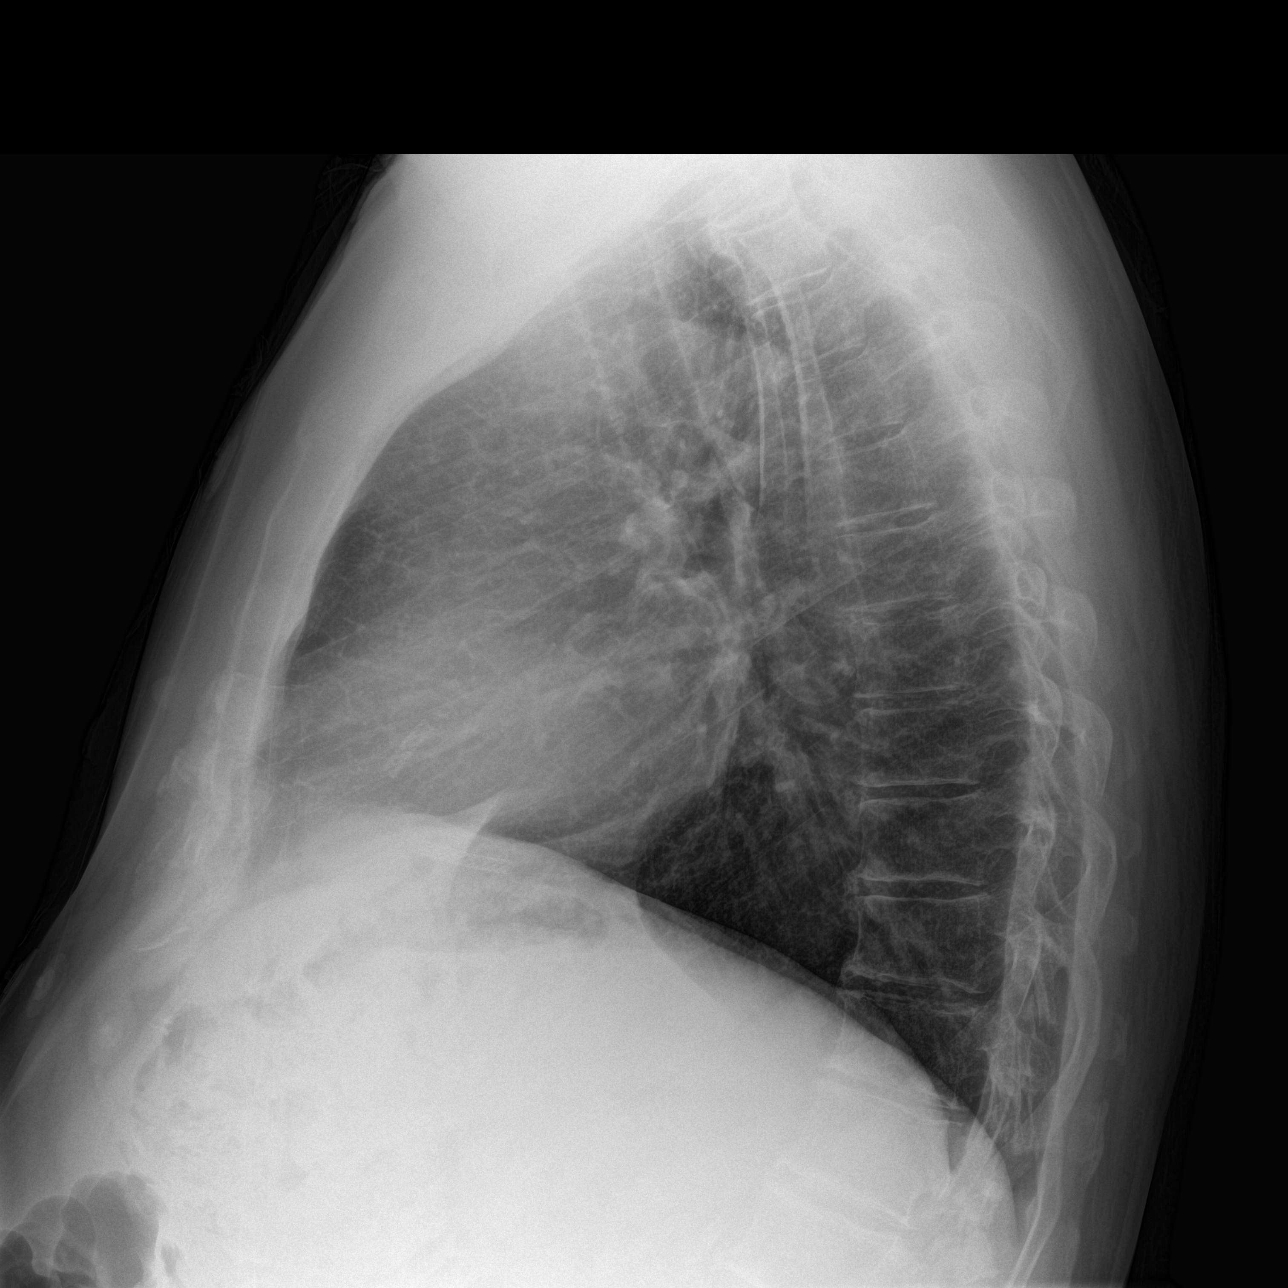

[3 of 3 positions shown; findings below may reference images not displayed]

FINDINGS: Stable cardiac and mediastinal contours. Atherosclerotic
calcifications noted in the tortuous aorta. Similar appearance of a
prominent epicardial fat pad versus scarring in the inferior
lingula. No focal airspace consolidation, pulmonary edema, pleural
effusion or pneumothorax. Stable mild central bronchitic change.
Incompletely imaged anterior cervical fusion hardware with screw
fractures. No acute osseous abnormality.
IMPRESSION: Stable chest x-ray without evidence of acute cardiopulmonary
process.

## 2016-03-31 DIAGNOSIS — J029 Acute pharyngitis, unspecified: Secondary | ICD-10-CM | POA: Diagnosis not present

## 2016-04-05 DIAGNOSIS — J40 Bronchitis, not specified as acute or chronic: Secondary | ICD-10-CM | POA: Diagnosis not present

## 2016-04-05 DIAGNOSIS — I10 Essential (primary) hypertension: Secondary | ICD-10-CM | POA: Diagnosis not present

## 2016-05-21 DIAGNOSIS — I25119 Atherosclerotic heart disease of native coronary artery with unspecified angina pectoris: Secondary | ICD-10-CM | POA: Diagnosis not present

## 2016-05-21 DIAGNOSIS — I10 Essential (primary) hypertension: Secondary | ICD-10-CM | POA: Diagnosis not present

## 2016-05-21 DIAGNOSIS — E782 Mixed hyperlipidemia: Secondary | ICD-10-CM | POA: Diagnosis not present

## 2016-05-21 DIAGNOSIS — Z794 Long term (current) use of insulin: Secondary | ICD-10-CM | POA: Diagnosis not present

## 2016-05-21 DIAGNOSIS — K599 Functional intestinal disorder, unspecified: Secondary | ICD-10-CM | POA: Diagnosis not present

## 2016-05-21 DIAGNOSIS — Z79899 Other long term (current) drug therapy: Secondary | ICD-10-CM | POA: Diagnosis not present

## 2016-05-21 DIAGNOSIS — E1165 Type 2 diabetes mellitus with hyperglycemia: Secondary | ICD-10-CM | POA: Diagnosis not present

## 2016-05-21 DIAGNOSIS — E118 Type 2 diabetes mellitus with unspecified complications: Secondary | ICD-10-CM | POA: Diagnosis not present

## 2016-05-21 DIAGNOSIS — N183 Chronic kidney disease, stage 3 (moderate): Secondary | ICD-10-CM | POA: Diagnosis not present

## 2016-05-24 ENCOUNTER — Encounter: Payer: Self-pay | Admitting: Physician Assistant

## 2016-06-01 ENCOUNTER — Encounter: Payer: Self-pay | Admitting: Physician Assistant

## 2016-06-01 ENCOUNTER — Telehealth: Payer: Self-pay

## 2016-06-01 ENCOUNTER — Ambulatory Visit (INDEPENDENT_AMBULATORY_CARE_PROVIDER_SITE_OTHER): Payer: PPO | Admitting: Physician Assistant

## 2016-06-01 VITALS — BP 118/72 | HR 58 | Ht 73.0 in | Wt 235.1 lb

## 2016-06-01 DIAGNOSIS — Z7901 Long term (current) use of anticoagulants: Secondary | ICD-10-CM | POA: Diagnosis not present

## 2016-06-01 DIAGNOSIS — Z1211 Encounter for screening for malignant neoplasm of colon: Secondary | ICD-10-CM | POA: Diagnosis not present

## 2016-06-01 DIAGNOSIS — R1310 Dysphagia, unspecified: Secondary | ICD-10-CM

## 2016-06-01 DIAGNOSIS — R194 Change in bowel habit: Secondary | ICD-10-CM | POA: Diagnosis not present

## 2016-06-01 MED ORDER — NA SULFATE-K SULFATE-MG SULF 17.5-3.13-1.6 GM/177ML PO SOLN: 1.0000 | Freq: Once | ORAL | 0 refills | Status: AC

## 2016-06-01 NOTE — Progress Notes (Signed)
Initial assessment and plans reviewed 

## 2016-06-01 NOTE — Patient Instructions (Addendum)
If you are age 70 or older, your body mass index should be between 23-30. Your Body mass index is 31.02 kg/m. If this is out of the aforementioned range listed, please consider follow up with your Primary Care Provider.  If you are age 64 or younger, your body mass index should be between 19-25. Your Body mass index is 31.02 kg/m. If this is out of the aformentioned range listed, please consider follow up with your Primary Care Provider.   You have been scheduled for a colonoscopy. Please follow written instructions given to you at your visit today.  Please pick up your prep supplies at the pharmacy within the next 1-3 days. If you use inhalers (even only as needed), please bring them with you on the day of your procedure. Your physician has requested that you go to www.startemmi.com and enter the access code given to you at your visit today. This web site gives a general overview about your procedure. However, you should still follow specific instructions given to you by our office regarding your preparation for the procedure.  We have sent the following medications to your pharmacy for you to pick up at your convenience:  Suprep  You have been given a high fiber diet handout.  We will contact your physician in regards to discontinuing your Eliquis. We will make you aware of their response.  Thank you.

## 2016-06-01 NOTE — Telephone Encounter (Signed)
Received fax from Dr Jacinto Halim stating that pt could hold his Eliquis for 48 hours (4 doses). He writes that pt would need to restart immediately if possible. If there is a risk of bleeding pt may hold off for 2-3 days. Note sent to be scanned.

## 2016-06-01 NOTE — Progress Notes (Signed)
Chief Complaint:Dysphagia, Change in bowel habit, Screenin colonoscopy  HPI:  Patrick Duran is a 71 year old Caucasian male with a past medical history of coronary artery disease maintained on Eliquis, GERD, hyperlipidemia, hypertension, trouble swallowing,type 2 diabetes, asthma as well as others listed below, who was referred to me by Vicenta Aly, FNP for consult for a screening colonoscopy as well as chronic symptoms of dysphagia and a recent change in bowel habits.     Patient was followed in our clinic previously by Dr. Deatra Ina. His last colonoscopy was on 06/17/05 with a finding of polyps in the sigmoid colon and was otherwise normal.   Today, the patient starts by telling me that he had an anterior neck fusion about 12 years ago and ever since then, "just like they told me I would", he has had a slight increase in difficulty with swallowing. He tells me that he was given a maneuver to do with his neck/swallowing recommendations which does help him. This does not inhibit him from eating food, but makes him extra careful. This has not changed recently but he wanted to make sure that we knew about it. Patient is maintained on pantoprazole 40 mg once daily for chronic reflux.   Patient also describes a change over the past year in his bowel habits. He used to have a regular solid bowel movement every day and over the past year this been radiating back and forth from constipation to diarrhea. He tells me that sometimes he will have diarrhea 2-3 times at night and then this will clear for the next few days and he will have a regular solid bowel movement and then he may be constipated for a few days and occasionally even has to use a laxative. This is accompanied by generalized abdominal discomfort when he lays down at night, noting this as a feeling of "fullness/bloating". Patient tells me his stomach rumbles loudly and he can hear it digesting at times. Patient does tell me that this alternation in  bowel habits has also been corresponding with a chnage in his blood sugars which have been somewhat hard to control over the past year.   Of note the patient does tell me that he has an allergy to egg yolk's.   Patient denies a any fever, chills, blood in the stool, melena, weight loss, fatigue, anorexia, nausea or vomiting.   Past Medical History:  Diagnosis Date  . Arthritis    "fingers, right hip" (11/07/2014)  . Complication of anesthesia    "trembling during my 1st cath"  . Coronary artery disease   . Easy bruising   . GERD (gastroesophageal reflux disease)   . Headache    "monthly" (11/07/2014)  . 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 History of stomach ulcers   . Hyperlipidemia   . Hypertension   . Hypothyroidism   . Myocardial infarction 03/2014  . PONV (postoperative nausea and vomiting)   . Sleep apnea    "probably; didn't want to do the test" (11/07/2014)  . Trouble swallowing   . Type II diabetes mellitus ([place])   . Vertigo     Past Surgical History:  Procedure Laterality Date  . ANTERIOR CERVICAL DECOMP/DISCECTOMY FUSION  2004  . BACK SURGERY    . CARDIAC CATHETERIZATION  2000  . CARDIAC CATHETERIZATION  04/15/2014   Procedure: CORONARY STENT INTERVENTION;  Surgeon: Laverda Page, MD;  Location: [facility]  CATH LAB;  Service: Cardiovascular;;  RCA  . CARDIAC CATHETERIZATION N/A 11/07/2014   Procedure: Left Heart Cath and Coronary Angiography;  Surgeon: Adrian Prows, MD;  Location: [facility] CV LAB;  Service: Cardiovascular;  Laterality: N/A;  . CORONARY ANGIOPLASTY WITH STENT PLACEMENT  2000; 11/07/2014   "3 stents; 1 stent"  . LAPAROSCOPIC CHOLECYSTECTOMY  1998  . LEFT HEART CATHETERIZATION WITH CORONARY ANGIOGRAM N/A 04/15/2014   Procedure: LEFT HEART CATHETERIZATION WITH CORONARY ANGIOGRAM;  Surgeon: Laverda Page, MD;  Location: [facility] CATH LAB;  Service: Cardiovascular;  Laterality: N/A;     Current Outpatient Prescriptions  Medication Sig Dispense Refill  . acetaminophen (TYLENOL) 500 MG tablet Take 500 mg by mouth every 6 (six) hours as needed. For pain    . Alpha-Lipoic Acid 300 MG CAPS Take by mouth daily.      Marland Kitchen apixaban (ELIQUIS) 5 MG TABS tablet Take 1 tablet (5 mg total) by mouth 2 (two) times daily. 60 tablet 0  . aspirin EC 81 MG EC tablet Take 1 tablet (81 mg total) by mouth daily. 30 tablet 0  . atorvastatin (LIPITOR) 40 MG tablet Take 40 mg by mouth daily.     Marland Kitchen co-enzyme Q-10 30 MG capsule Take 120 mg by mouth daily.      . fenofibrate micronized (LOFIBRA) 134 MG capsule Take 134 mg by mouth daily before breakfast.    . glipiZIDE (GLUCOTROL XL) 10 MG 24 hr tablet Take 10 mg by mouth 2 (two) times daily.     . Grape Seed OIL Take 1 tablet by mouth daily.     . Insulin Glargine (LANTUS) 100 UNIT/ML Solostar Pen Inject 20 Units into the skin as directed. 15-35 units q a.m.    Marland Kitchen levothyroxine (SYNTHROID, LEVOTHROID) 75 MCG tablet Take 75 mcg by mouth daily before breakfast.    . lisinopril (PRINIVIL,ZESTRIL) 10 MG tablet daily.    . metFORMIN (GLUCOPHAGE) 1000 MG tablet Take 1 tablet (1,000 mg total) by mouth 2 (two) times daily with a meal.    . metoprolol tartrate (LOPRESSOR) 50 MG tablet Take 1 tablet (50 mg total) by mouth 2 (two) times daily. 60 tablet 3  . Multiple Vitamin (MULTIVITAMIN PO) Take 1 tablet by mouth daily.     . niacin 100 MG tablet Take 100 mg by mouth daily.    . pantoprazole (PROTONIX) 40 MG tablet Take 40 mg by mouth daily.     Marland Kitchen POTASSIUM GLUCONATE PO Take 1 tablet by mouth daily.     . vitamin B-12 (CYANOCOBALAMIN) 100 MCG tablet Take 50 mcg by mouth daily.      . Zinc Acetate, Oral, (ZINC ACETATE PO) Take 1 tablet by mouth daily.     . Na Sulfate-K Sulfate-Mg Sulf 17.5-3.13-1.6 GM/180ML SOLN Take 1 kit by mouth once. 354 mL 0   No current facility-administered medications for this visit.     Allergies as of 06/01/2016 - Review  Complete 06/01/2016  Allergen Reaction Noted  . Codeine sulfate    . Eggs or egg-derived products  04/13/2014  . Hydrocodone  12/23/2010  . Peanut-containing drug products  04/13/2014  . Percocet [oxycodone-acetaminophen]  12/23/2010    Family History  Problem Relation Age of Onset  . Diabetes Mother   . Hypothyroidism Mother   . Stroke Father   . Hypertension Father   . Colon cancer Neg Hx   . Stomach cancer Neg Hx   . Rectal cancer Neg Hx   . Esophageal cancer  Neg Hx   . Liver cancer Neg Hx     Social History   Social History  . Marital status: Married    Spouse name: N/A  . Number of children: N/A  . Years of education: N/A   Occupational History  . Not on file.   Social History Main Topics  . Smoking status: Former Smoker    Packs/day: 1.50    Years: 34.00    Types: Cigarettes  . Smokeless tobacco: Never Used     Comment: "stopped smoking in ~ 2000"  . Alcohol use No  . Drug use: No     Comment: 11/07/2014 "experimented in the 1960's and 1970's; nothing since"  . Sexual activity: Not Currently   Other Topics Concern  . Not on file   Social History Narrative  . No narrative on file    Review of Systems:     Constitutional: No weight loss, fever or chills Skin: Positive for rash  Cardiovascular: No chest pain Respiratory: No SOB Gastrointestinal: See HPI and otherwise negative Genitourinary: Positive for urine leakage Neurological: Positive for headaches Musculoskeletal: No new muscle or joint pain Hematologic: No bleeding  Psychiatric: No history of depression or anxiety   Physical Exam:  Vital signs: BP 118/72   Pulse (!) 58   Ht 6' 1"  (1.854 m)   Wt 235 lb 2 oz (106.7 kg)   BMI 31.02 kg/m   Constitutional:   Pleasant Caucasian male appears to be in NAD, Well developed, Well nourished, alert and cooperative Head:  Normocephalic and atraumatic. Eyes:   PEERL, EOMI. No icterus. Conjunctiva pink. Ears:  Normal auditory acuity. Neck:   Supple Throat: Oral cavity and pharynx without inflammation, swelling or lesion.  Respiratory: Respirations even and unlabored. Lungs clear to auscultation bilaterally.   No wheezes, crackles, or rhonchi.  Cardiovascular: Normal S1, S2. No MRG. Regular rate and rhythm. No peripheral edema, cyanosis or pallor.  Gastrointestinal:  Soft, nondistended, nontender. No rebound or guarding. Normal bowel sounds. No appreciable masses or hepatomegaly. Rectal:  Not performed.  Msk:  Symmetrical without gross deformities. Without edema, no deformity or joint abnormality.  Neurologic:  Alert and  oriented x4;  grossly normal neurologically.  Skin:   Dry and intact without significant lesions or rashes. Psychiatric:  Demonstrates good judgement and reason without abnormal affect or behaviors.  No recent labs or imaging.  Assessment: 1. Screening colonoscopy: Patient is past due for a screening colonoscopy 2. Change in bowel habits: Patient has had radiation between constipation or diarrhea, this will likely be helped with an increase in fiber, if he were not due for a screening colonoscopy at this time I would not be pursuing one 3. Chronic dysphagia: Ever since anterior fusion of his neck, no change recently, does not inhibit eating  Plan: 1. Scheduled patient for a screening colonoscopy in the [facility] with Dr. Henrene Pastor, as he is the supervising physician today. Did discuss risks, limits, limitations and alternatives the patient agrees to proceed. 2. Patient was advised to hold his Eliquis for 24 hours prior to the procedure. We will communicate with his cardiologist to ensure that holding his Eliquis is acceptable. 3. Patient does describe an egg allergy, he will not be able to use propofol during his procedure. 4. We reviewed anti-dysphagia measures. 5. Patient continue his pantoprazole 40 mg daily 6. Recommend the patient increase fiber in his diet 25-35 g daily with a fiber supplement such as Metamucil,  Citrucel or Benefiber. 7. Recommend the patient  increase water intake to at least  6-8 8 ounce glasses a day 8. Patient to follow in clinic per Dr. Blanch Media recommendations after time of procedure.  Ellouise Newer, PA-C [facility] 06/01/2016, 11:45 AM  Cc: Vicenta Aly, [place]

## 2016-08-10 ENCOUNTER — Encounter: Payer: PPO | Admitting: Internal Medicine

## 2016-08-24 DIAGNOSIS — I2510 Atherosclerotic heart disease of native coronary artery without angina pectoris: Secondary | ICD-10-CM | POA: Diagnosis not present

## 2016-08-24 DIAGNOSIS — E782 Mixed hyperlipidemia: Secondary | ICD-10-CM | POA: Diagnosis not present

## 2016-08-27 DIAGNOSIS — I2510 Atherosclerotic heart disease of native coronary artery without angina pectoris: Secondary | ICD-10-CM | POA: Diagnosis not present

## 2016-08-27 DIAGNOSIS — I480 Paroxysmal atrial fibrillation: Secondary | ICD-10-CM | POA: Diagnosis not present

## 2016-08-27 DIAGNOSIS — I10 Essential (primary) hypertension: Secondary | ICD-10-CM | POA: Diagnosis not present

## 2016-08-27 DIAGNOSIS — E782 Mixed hyperlipidemia: Secondary | ICD-10-CM | POA: Diagnosis not present

## 2016-08-30 ENCOUNTER — Encounter: Payer: Self-pay | Admitting: Podiatry

## 2016-08-30 ENCOUNTER — Ambulatory Visit (INDEPENDENT_AMBULATORY_CARE_PROVIDER_SITE_OTHER): Payer: PPO | Admitting: Podiatry

## 2016-08-30 VITALS — BP 129/77 | HR 51

## 2016-08-30 DIAGNOSIS — M779 Enthesopathy, unspecified: Secondary | ICD-10-CM

## 2016-08-30 DIAGNOSIS — Q828 Other specified congenital malformations of skin: Secondary | ICD-10-CM | POA: Diagnosis not present

## 2016-08-30 DIAGNOSIS — E1149 Type 2 diabetes mellitus with other diabetic neurological complication: Secondary | ICD-10-CM | POA: Diagnosis not present

## 2016-08-30 DIAGNOSIS — E1140 Type 2 diabetes mellitus with diabetic neuropathy, unspecified: Secondary | ICD-10-CM | POA: Diagnosis not present

## 2016-08-30 MED ORDER — TRIAMCINOLONE ACETONIDE 10 MG/ML IJ SUSP
10.0000 mg | Freq: Once | INTRAMUSCULAR | Status: AC
  Administered 2016-08-30: 10 mg

## 2016-08-30 NOTE — Progress Notes (Signed)
Subjective:    Patient ID: Patrick Duran, male   DOB: 71 y.o.   MRN: [id number]   HPI patient points to underneath the fifth metatarsal the right foot stating that it's been very tender and making walking difficult    Review of Systems  All other systems reviewed and are negative.       Objective:  Physical Exam  Cardiovascular: Intact distal pulses.   Musculoskeletal: Normal range of motion.  Neurological: He is alert.  Skin: Skin is warm.  Nursing note and vitals reviewed.  neurovascular status intact muscle strength adequate range of motion within normal limits with patient found to have keratotic lesion that's inflamed with fluid buildup fifth metatarsal head right with deep seated core. 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 No iatrogenic bleeding was noted and will reappoint when symptomatic

## 2016-08-30 NOTE — Progress Notes (Signed)
   Subjective:    Patient ID: Patrick Duran, male    DOB: [birth year], 71 y.o.   MRN: [id number]  HPI  Chief Complaint  Patient presents with  . Callouses    Rt bottom foot have hard skin/callus and been painful for about 1 year.     Review of Systems  HENT: Positive for hearing loss.   Eyes: Positive for visual disturbance.  Cardiovascular: Positive for palpitations.  Musculoskeletal: Positive for gait problem and myalgias.  Skin: Positive for color change.       Objective:   Physical Exam        Assessment & Plan:

## 2016-09-24 IMAGING — DX DG CHEST 2V
2 series · 2 of 2 positions shown · non-contrast
Comparison: April 13, 2014

CLINICAL DATA: Progressive chest pain

EXAM:
CHEST  2 VIEW

[chest pa]
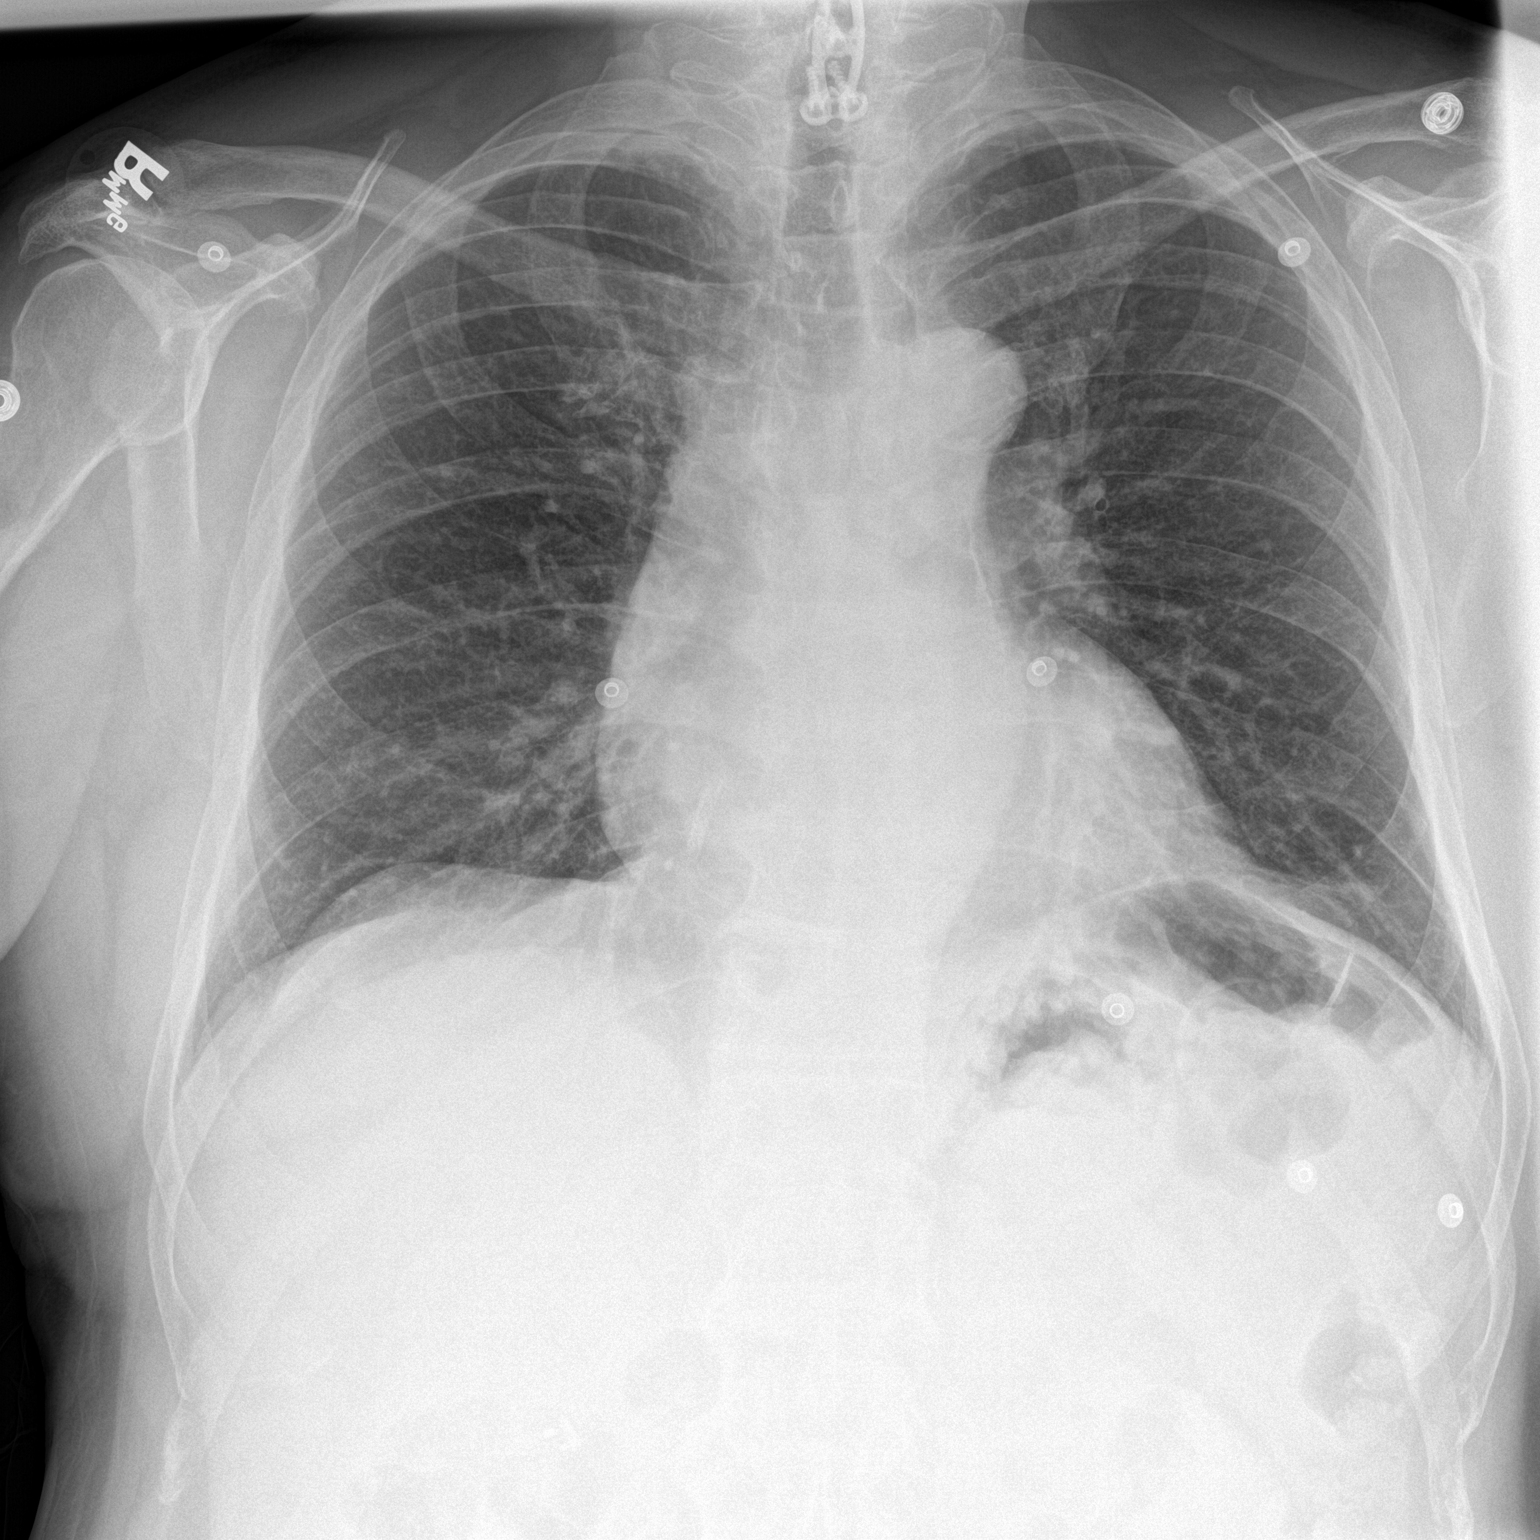

[chest lat]
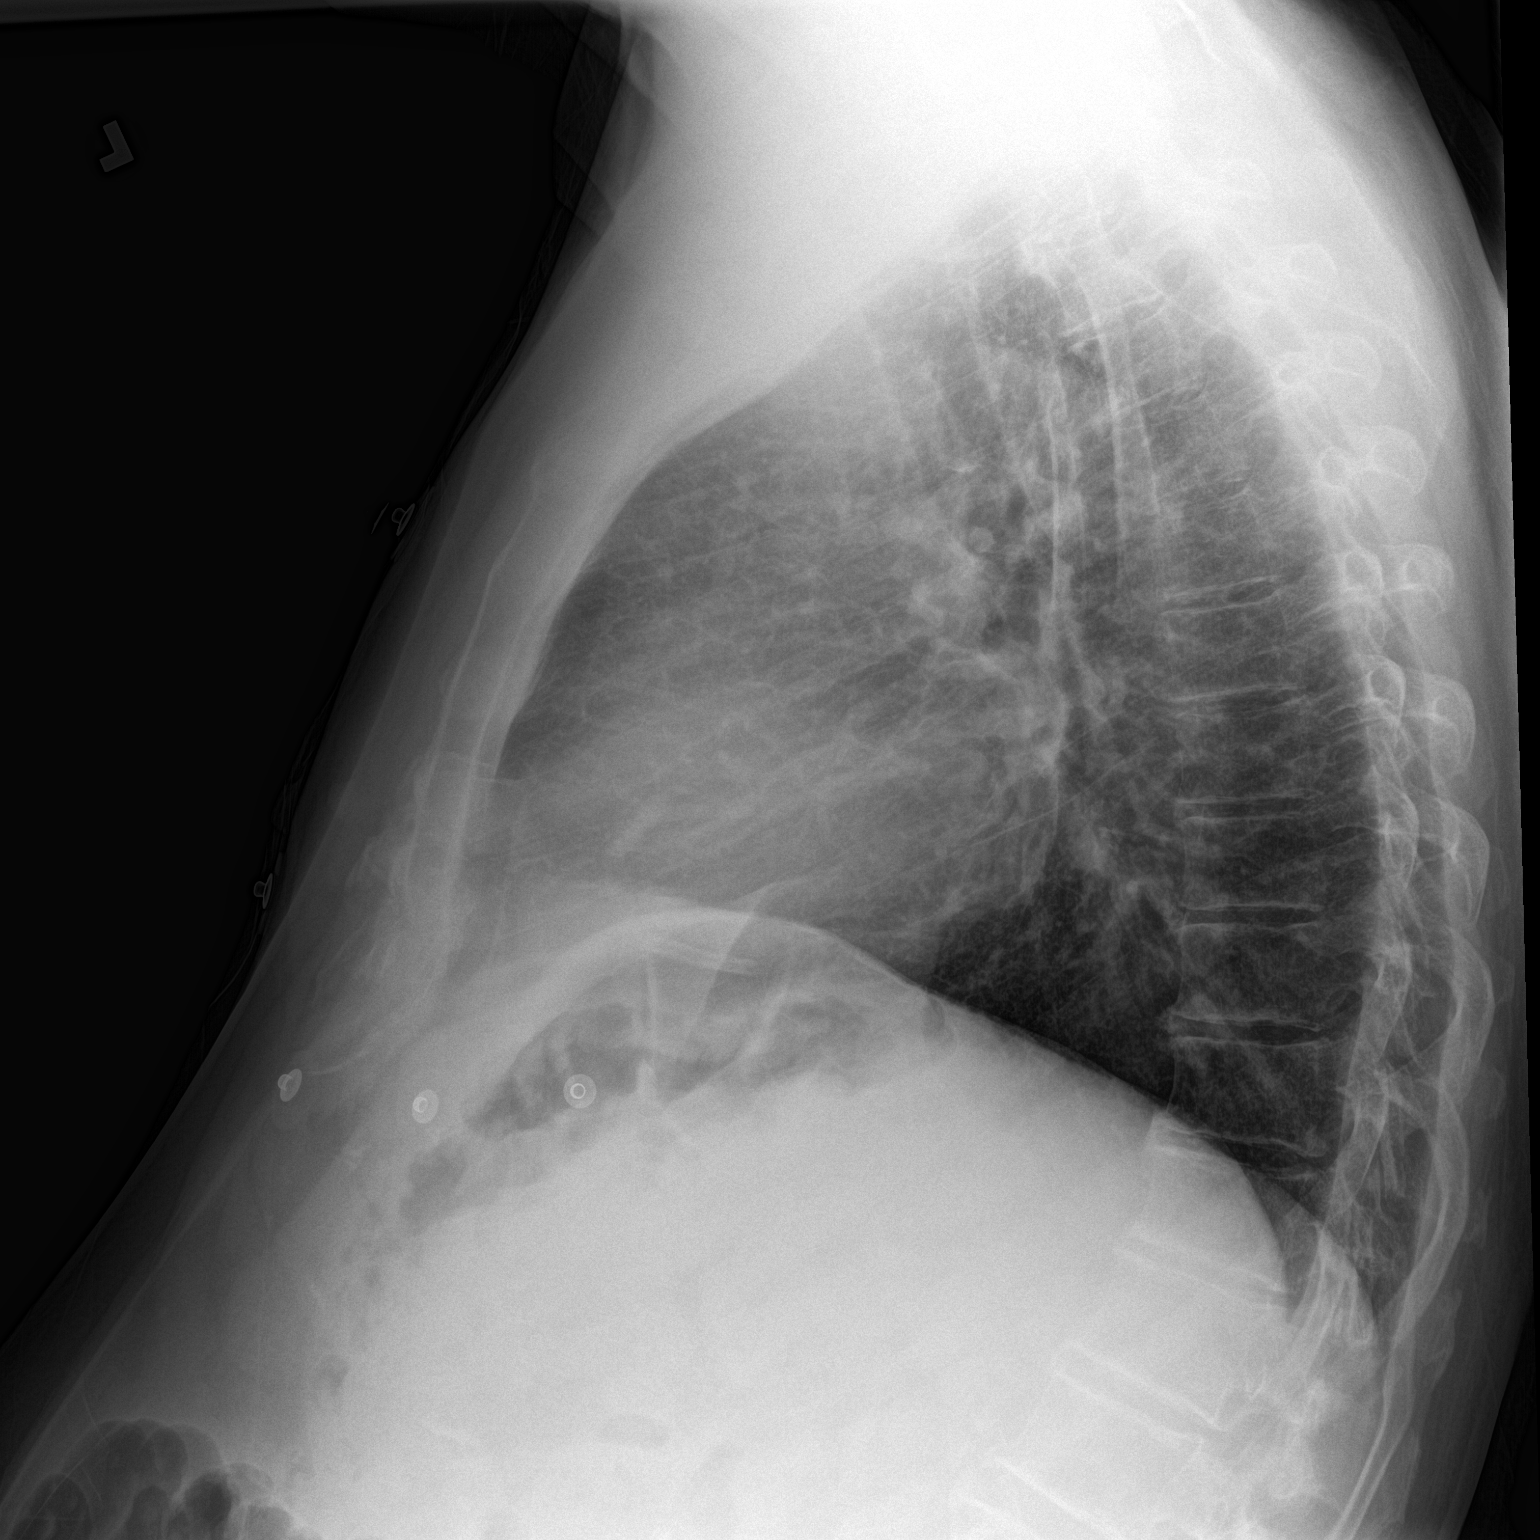

[2 of 2 positions shown; findings below may reference images not displayed]

FINDINGS: There is mild scarring in the left base. Lungs elsewhere clear.
Heart is upper normal in size with pulmonary vascularity within
normal limits. There are coronary artery stents apparent. No
adenopathy. There is postoperative change in the lower cervical
spine.
IMPRESSION: Scarring left base. No edema or consolidation. No change in cardiac
silhouette.

## 2016-11-18 DIAGNOSIS — E782 Mixed hyperlipidemia: Secondary | ICD-10-CM | POA: Diagnosis not present

## 2016-11-18 DIAGNOSIS — E118 Type 2 diabetes mellitus with unspecified complications: Secondary | ICD-10-CM | POA: Diagnosis not present

## 2016-11-18 DIAGNOSIS — R42 Dizziness and giddiness: Secondary | ICD-10-CM | POA: Diagnosis not present

## 2016-11-18 DIAGNOSIS — E039 Hypothyroidism, unspecified: Secondary | ICD-10-CM | POA: Diagnosis not present

## 2016-11-18 DIAGNOSIS — I10 Essential (primary) hypertension: Secondary | ICD-10-CM | POA: Diagnosis not present

## 2016-11-24 ENCOUNTER — Other Ambulatory Visit: Payer: Self-pay | Admitting: Nurse Practitioner

## 2016-11-24 DIAGNOSIS — I25119 Atherosclerotic heart disease of native coronary artery with unspecified angina pectoris: Secondary | ICD-10-CM

## 2016-11-24 DIAGNOSIS — G4459 Other complicated headache syndrome: Secondary | ICD-10-CM

## 2016-11-24 DIAGNOSIS — R42 Dizziness and giddiness: Secondary | ICD-10-CM

## 2016-12-01 ENCOUNTER — Ambulatory Visit
Admission: RE | Admit: 2016-12-01 | Discharge: 2016-12-01 | Disposition: A | Discharge status: Home / Self Care | Payer: PPO | Source: Ambulatory Visit | Attending: Nurse Practitioner | Admitting: Nurse Practitioner

## 2016-12-01 ENCOUNTER — Other Ambulatory Visit: Payer: PPO

## 2016-12-01 DIAGNOSIS — I25119 Atherosclerotic heart disease of native coronary artery with unspecified angina pectoris: Secondary | ICD-10-CM

## 2016-12-01 DIAGNOSIS — I6523 Occlusion and stenosis of bilateral carotid arteries: Secondary | ICD-10-CM | POA: Diagnosis not present

## 2016-12-04 ENCOUNTER — Inpatient Hospital Stay
Admission: RE | Admit: 2016-12-04 | Discharge: 2016-12-04 | Disposition: A | Discharge status: Home / Self Care | Payer: PPO | Source: Ambulatory Visit | Attending: Nurse Practitioner | Admitting: Nurse Practitioner

## 2016-12-04 ENCOUNTER — Other Ambulatory Visit: Payer: PPO

## 2017-01-12 ENCOUNTER — Ambulatory Visit
Admission: RE | Admit: 2017-01-12 | Discharge: 2017-01-12 | Disposition: A | Discharge status: Home / Self Care | Payer: PPO | Source: Ambulatory Visit | Attending: Nurse Practitioner | Admitting: Nurse Practitioner

## 2017-01-12 DIAGNOSIS — R42 Dizziness and giddiness: Secondary | ICD-10-CM

## 2017-01-12 DIAGNOSIS — G4459 Other complicated headache syndrome: Secondary | ICD-10-CM

## 2017-01-12 DIAGNOSIS — R51 Headache: Secondary | ICD-10-CM | POA: Diagnosis not present

## 2017-01-20 DIAGNOSIS — E119 Type 2 diabetes mellitus without complications: Secondary | ICD-10-CM | POA: Diagnosis not present

## 2017-01-28 DIAGNOSIS — R42 Dizziness and giddiness: Secondary | ICD-10-CM | POA: Diagnosis not present

## 2017-01-28 DIAGNOSIS — I200 Unstable angina: Secondary | ICD-10-CM | POA: Diagnosis not present

## 2017-01-28 DIAGNOSIS — I25119 Atherosclerotic heart disease of native coronary artery with unspecified angina pectoris: Secondary | ICD-10-CM | POA: Diagnosis not present

## 2017-01-28 DIAGNOSIS — R609 Edema, unspecified: Secondary | ICD-10-CM | POA: Diagnosis not present

## 2017-02-04 DIAGNOSIS — R609 Edema, unspecified: Secondary | ICD-10-CM | POA: Diagnosis not present

## 2017-03-02 DIAGNOSIS — I480 Paroxysmal atrial fibrillation: Secondary | ICD-10-CM | POA: Diagnosis not present

## 2017-03-02 DIAGNOSIS — I10 Essential (primary) hypertension: Secondary | ICD-10-CM | POA: Diagnosis not present

## 2017-03-02 DIAGNOSIS — E782 Mixed hyperlipidemia: Secondary | ICD-10-CM | POA: Diagnosis not present

## 2017-03-02 DIAGNOSIS — I2510 Atherosclerotic heart disease of native coronary artery without angina pectoris: Secondary | ICD-10-CM | POA: Diagnosis not present

## 2017-03-14 DIAGNOSIS — E785 Hyperlipidemia, unspecified: Secondary | ICD-10-CM | POA: Diagnosis not present

## 2017-03-14 DIAGNOSIS — E039 Hypothyroidism, unspecified: Secondary | ICD-10-CM | POA: Diagnosis not present

## 2017-05-02 ENCOUNTER — Ambulatory Visit: Payer: Medicare Other | Admitting: Podiatry

## 2017-05-02 ENCOUNTER — Ambulatory Visit (INDEPENDENT_AMBULATORY_CARE_PROVIDER_SITE_OTHER): Payer: Medicare Other

## 2017-05-02 ENCOUNTER — Encounter: Payer: Self-pay | Admitting: Podiatry

## 2017-05-02 DIAGNOSIS — M779 Enthesopathy, unspecified: Secondary | ICD-10-CM | POA: Diagnosis not present

## 2017-05-02 DIAGNOSIS — M7750 Other enthesopathy of unspecified foot: Secondary | ICD-10-CM | POA: Diagnosis not present

## 2017-05-02 DIAGNOSIS — L84 Corns and callosities: Secondary | ICD-10-CM | POA: Diagnosis not present

## 2017-05-02 DIAGNOSIS — E1140 Type 2 diabetes mellitus with diabetic neuropathy, unspecified: Secondary | ICD-10-CM

## 2017-05-02 DIAGNOSIS — Q828 Other specified congenital malformations of skin: Secondary | ICD-10-CM

## 2017-05-02 DIAGNOSIS — E1149 Type 2 diabetes mellitus with other diabetic neurological complication: Secondary | ICD-10-CM

## 2017-05-02 MED ORDER — TRIAMCINOLONE ACETONIDE 10 MG/ML IJ SUSP
10.0000 mg | Freq: Once | INTRAMUSCULAR | Status: AC
  Administered 2017-05-02: 10 mg

## 2017-05-02 NOTE — Patient Instructions (Addendum)

## 2017-05-04 NOTE — Progress Notes (Signed)
Subjective:   Patient ID: Patrick Duran, male   DOB: 72 y.o.   MRN: [id number]   HPI Patient presents with chronic inflammatory changes fifth metatarsal right with fluid buildup and keratotic lesion that is painful.  States he had relief for at least 6 months   ROS      Objective:  Physical Exam  Neurovascular status intact with inflammation lesion formation plantar callus right fifth MPJ with fluid buildup of the joint itself     Assessment:  Inflammatory capsulitis fifth MPJ right with pain and lesion formation     Plan:  H&P condition reviewed and under sterile technique careful injection administered sub-fifth metatarsal 2 mg dexamethasone Kenalog 5 mg Xylocaine appropriate numbness and then sharp debridement of thick porokeratotic lesion accomplished with no iatrogenic bleeding.  Reappoint when symptomatic and may ultimately require surgical intervention in this case signed visit

## 2017-05-30 ENCOUNTER — Other Ambulatory Visit: Payer: Self-pay | Admitting: Cardiology

## 2017-05-30 DIAGNOSIS — I872 Venous insufficiency (chronic) (peripheral): Secondary | ICD-10-CM

## 2017-06-09 ENCOUNTER — Other Ambulatory Visit: Payer: PPO

## 2017-06-16 ENCOUNTER — Other Ambulatory Visit: Payer: PPO

## 2017-06-23 ENCOUNTER — Ambulatory Visit
Admission: RE | Admit: 2017-06-23 | Discharge: 2017-06-23 | Disposition: A | Discharge status: Home / Self Care | Payer: Medicare Other | Source: Ambulatory Visit | Attending: Cardiology | Admitting: Cardiology

## 2017-06-23 DIAGNOSIS — I872 Venous insufficiency (chronic) (peripheral): Secondary | ICD-10-CM

## 2017-06-23 NOTE — Consult Note (Signed)
Chief Complaint: I have some leg swelling.   Referring Physician(s): Ganji,Jay  History of Present Illness: Patrick Duran is a 72 y.o. male presenting today as a scheduled consultation to VIR clinic, kindly referred by Dr. Jacinto Halim, for evaluation of possible chronic venous insufficiency contributing to some lower extremity swelling.    Patrick Duran is here by himself for the interview.  He tells me that Dr. Jacinto Halim recognized some skin changes of his lower extremity as well as his previous episode of lower extremity swelling, and he felt he should have his veins evaluated.    Patrick Duran tells me that he has no prior history of lower extremity DVT.  He has no history of PE.  He has never had a spontaneous wound.  He has had a traumatic wound of the anterior right proximal calf, however, this healed without problems.    He sleeps flat in bed with no orthopnea/no need for head elevation.  He does report some ankle swelling, and he was recently treated with a course of lasix, which he tells me he has now finished.   He has been trying to wear some compression stockings, knee-high, but tells me that he has trouble getting them on, and has difficulty wearing them.      Past Medical History:  Diagnosis Date  . Arthritis    "fingers, right hip" (11/07/2014)  . Complication of anesthesia    "trembling during my 1st cath"  . Coronary artery disease   . Easy bruising   . GERD (gastroesophageal reflux disease)   . Headache    "monthly" (11/07/2014)  . 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 History of stomach ulcers   . Hyperlipidemia   . Hypertension   . Hypothyroidism   . Myocardial infarction (HCC) 03/2014  . PONV (postoperative nausea and vomiting)   . Sleep apnea    "probably; didn't want to do the test" (11/07/2014)  . Trouble swallowing   . Type II diabetes mellitus (HCC)   . Vertigo     Past Surgical  History:  Procedure Laterality Date  . ANTERIOR CERVICAL DECOMP/DISCECTOMY FUSION  2004  . BACK SURGERY    . CARDIAC CATHETERIZATION  2000  . CARDIAC CATHETERIZATION  04/15/2014   Procedure: CORONARY STENT INTERVENTION;  Surgeon: Pamella Pert, MD;  Location: [facility] CATH LAB;  Service: Cardiovascular;;  RCA  . CARDIAC CATHETERIZATION N/A 11/07/2014   Procedure: Left Heart Cath and Coronary Angiography;  Surgeon: Yates Decamp, MD;  Location: [facility] INVASIVE CV LAB;  Service: Cardiovascular;  Laterality: N/A;  . CORONARY ANGIOPLASTY WITH STENT PLACEMENT  2000; 11/07/2014   "3 stents; 1 stent"  . LAPAROSCOPIC CHOLECYSTECTOMY  1998  . LEFT HEART CATHETERIZATION WITH CORONARY ANGIOGRAM N/A 04/15/2014   Procedure: LEFT HEART CATHETERIZATION WITH CORONARY ANGIOGRAM;  Surgeon: Pamella Pert, MD;  Location: [facility] CATH LAB;  Service: Cardiovascular;  Laterality: N/A;    Allergies: Codeine sulfate; Eggs or egg-derived products; Hydrocodone; Peanut-containing drug products; and Percocet [oxycodone-acetaminophen]  Medications: Prior to Admission medications   Medication Sig Start Date End Date Taking? Authorizing Provider  acetaminophen (TYLENOL) 500 MG tablet Take 500 mg by mouth every 6 (six) hours as needed. For pain   Yes [provider]  Alpha-Lipoic Acid 300 MG CAPS Take by mouth daily.     Yes [provider]  apixaban (ELIQUIS) 5 MG TABS tablet Take  1 tablet (5 mg total) by mouth 2 (two) times daily. 11/08/14  Yes Yates Decamp, MD  aspirin EC 81 MG EC tablet Take 1 tablet (81 mg total) by mouth daily. 04/16/14  Yes Marcy Salvo, NP  atorvastatin (LIPITOR) 40 MG tablet Take 40 mg by mouth daily.  12/04/10  Yes [provider]  co-enzyme Q-10 30 MG capsule Take 120 mg by mouth daily.     Yes [provider]  diazepam (VALIUM) 5 MG tablet Take 5 mg by mouth every 6 (six) hours as needed for anxiety.   Yes [provider]  dimenhyDRINATE (DRAMAMINE) 50 MG tablet  Take 50 mg by mouth every 8 (eight) hours as needed.   Yes [provider]  fenofibrate micronized (LOFIBRA) 134 MG capsule Take 134 mg by mouth daily before breakfast.   Yes [provider]  glipiZIDE (GLUCOTROL XL) 10 MG 24 hr tablet Take 10 mg by mouth 2 (two) times daily.    Yes [provider]  Grape Seed OIL Take 1 tablet by mouth daily.    Yes [provider]  hydrALAZINE (APRESOLINE) 10 MG tablet Take 10 mg by mouth 3 (three) times daily.   Yes [provider]  Insulin Glargine (LANTUS) 100 UNIT/ML Solostar Pen Inject 20 Units into the skin as directed. 15-35 units q a.m.   Yes [provider]  levothyroxine (SYNTHROID, LEVOTHROID) 75 MCG tablet Take 75 mcg by mouth daily before breakfast.   Yes [provider]  lisinopril (PRINIVIL,ZESTRIL) 10 MG tablet daily. 04/12/16  Yes [provider]  metFORMIN (GLUCOPHAGE) 1000 MG tablet Take 1 tablet (1,000 mg total) by mouth 2 (two) times daily with a meal. 11/09/14  Yes Yates Decamp, MD  metoprolol tartrate (LOPRESSOR) 50 MG tablet Take 1 tablet (50 mg total) by mouth 2 (two) times daily. 11/08/14  Yes Yates Decamp, MD  Multiple Vitamin (MULTIVITAMIN PO) Take 1 tablet by mouth daily.    Yes [provider]  niacin 100 MG tablet Take 100 mg by mouth daily.   Yes [provider]  pantoprazole (PROTONIX) 40 MG tablet Take 40 mg by mouth daily.  12/12/10  Yes [provider]  POTASSIUM GLUCONATE PO Take 1 tablet by mouth daily.    Yes [provider]  vitamin B-12 (CYANOCOBALAMIN) 100 MCG tablet Take 50 mcg by mouth daily.     Yes [provider]  Zinc Acetate, Oral, (ZINC ACETATE PO) Take 1 tablet by mouth daily.    Yes [provider]     Family History  Problem Relation Age of Onset  . Diabetes Mother   . Hypothyroidism Mother   . Stroke Father   . Hypertension Father   . Colon cancer Neg Hx   . Stomach cancer Neg Hx   .  Rectal cancer Neg Hx   . Esophageal cancer Neg Hx   . Liver cancer Neg Hx     Social History   Socioeconomic History  . Marital status: Married    Spouse name: Not on file  . Number of children: Not on file  . Years of education: Not on file  . Highest education level: Not on file  Occupational History  . Not on file  Social Needs  . Financial resource strain: Not on file  . Food insecurity:    Worry: Not on file    Inability: Not on file  . Transportation needs:    Medical: Not on file  Non-medical: Not on file  Tobacco Use  . Smoking status: Former Smoker    Packs/day: 1.50    Years: 34.00    Pack years: 51.00    Types: Cigarettes  . Smokeless tobacco: Never Used  . Tobacco comment: "stopped smoking in ~ 2000"  Substance and Sexual Activity  . Alcohol use: No  . Drug use: No    Comment: 11/07/2014 "experimented in the 1960's and 1970's; nothing since"  . Sexual activity: Not Currently  Lifestyle  . Physical activity:    Days per week: Not on file    Minutes per session: Not on file  . Stress: Not on file  Relationships  . Social connections:    Talks on phone: Not on file    Gets together: Not on file    Attends religious service: Not on file    Active member of club or organization: Not on file    Attends meetings of clubs or organizations: Not on file    Relationship status: Not on file  Other Topics Concern  . Not on file  Social History Narrative  . Not on file       Review of Systems: A 12 point ROS discussed and pertinent positives are indicated in the HPI above.  All other systems are negative.  Review of Systems  Vital Signs: BP 122/60   Pulse (!) 55   Temp 97.7 F (36.5 C) (Oral)   Resp 16   Ht 6\' 1"  (1.854 m)   Wt 235 lb (106.6 kg)   SpO2 100%   BMI 31.00 kg/m   Physical Exam General: 72 yo male appearing  stated age.  Well-developed, well-nourished.  No distress. HEENT: Atraumatic, normocephalic.  Glasses. Conjugate gaze,  extra-ocular motor intact. No scleral icterus or scleral injection. No lesions on external ears, nose, lips, or gums.  Oral mucosa moist, pink.  Neck: Symmetric with no goiter enlargement.  Chest/Lungs:  Symmetric chest with inspiration/expiration.  No labored breathing.  Clear to auscultation with no wheezes, rhonchi, or rales.  Heart:  RRR, with no third heart sounds appreciated. No JVD appreciated.  Abdomen:  Soft, NT/ND, with + bowel sounds.   Genito-urinary: Deferred Neurologic: Alert & Oriented to person, place, and time.   Normal affect and insight.  Appropriate questions.  Moving all 4 extremities with gross sensory intact.   Extremities: Minimal telangiectasias bilateral lower extremity.  No pitting edema.  Minimal hemosiderin deposition.  No wounds.  No lipodermatosclerosis.  Mallampati Score:     Imaging: US Venous Img Lower Bilateral  Result Date: 06/23/2017 CLINICAL DATA:  48-year-old male with a history lower extremity swelling. EXAM: BILATERAL LOWER EXTREMITY VENOUS DOPPLER ULTRASOUND TECHNIQUE: Gray-scale sonography with graded compression, as well as color Doppler and duplex ultrasound were performed to evaluate the lower extremity deep venous systems from the level of the common femoral vein and including the common femoral, femoral, profunda femoral, popliteal and calf veins including the posterior tibial, peroneal and gastrocnemius veins when visible. The superficial great saphenous vein was also interrogated. Spectral Doppler was utilized to evaluate flow at rest and with distal augmentation maneuvers in the common femoral, femoral and popliteal veins. COMPARISON:  None. FINDINGS: RIGHT LOWER EXTREMITY Common Femoral Vein: No evidence of thrombus. Normal compressibility, respiratory phasicity and response to augmentation. Saphenofemoral Junction: No evidence of thrombus. Normal compressibility and flow on color Doppler imaging. Profunda Femoral Vein: No evidence of thrombus. Normal  compressibility and flow on color Doppler imaging. Femoral Vein: No evidence  of thrombus. Normal compressibility, respiratory phasicity and response to augmentation. Popliteal Vein: No evidence of thrombus. Normal compressibility, respiratory phasicity and response to augmentation. Calf Veins: No evidence of thrombus. Normal compressibility and flow on color Doppler imaging. GSV at [facility]:  No reflux GSV at Proximal Thigh:  No reflux GSV at Mid Thigh:  No reflux GSV at Distal Thigh:  No reflux GSV at Prox Calf: 2 seconds reflux GSV at Mid Calf:  No reflux GSV at Distal Calf:  No reflux SSV at Sapheno popliteal junction:  No reflux SSV at mid calf:  No reflux SSF at distal calf: Greater than 4 seconds reflux in the distal calf. Varicosities:  None Other Findings:  None. LEFT LOWER EXTREMITY Common Femoral Vein: No evidence of thrombus. Normal compressibility, respiratory phasicity and response to augmentation. Saphenofemoral Junction: No evidence of thrombus. Normal compressibility and flow on color Doppler imaging. Profunda Femoral Vein: No evidence of thrombus. Normal compressibility and flow on color Doppler imaging. Femoral Vein: No evidence of thrombus. Normal compressibility, respiratory phasicity and response to augmentation. Popliteal Vein: No evidence of thrombus. Normal compressibility, respiratory phasicity and response to augmentation. Calf Veins: No evidence of thrombus. Normal compressibility and flow on color Doppler imaging. GSV at [facility]:  No reflux GSV at Proximal Thigh:  No reflux Note that the patient has a variant anastomosis with the common femoral vein in the proximal thigh with a large perforating vein from the great saphenous to the common femoral, inferior to the typical saphenofemoral junction, which should be observed as a potential pitfall if treating the great saphenous vein. GSV at Mid Thigh:  No reflux GSV at Distal Thigh:  No reflux GSV at Prox Calf: Short-segment reflux greater than 5  seconds in the proximal calf GSV at Mid Calf:  No reflux GSV at Distal Calf: Greater than 4 seconds of reflux SSV at Sapheno popliteal junction: No reflux. The patient has a thigh extender, with continuation the small saphenous vein in the posterior thigh, though does have a junction with the popliteal vein. SSV at mid calf:  No reflux SSF at distal calf: Greater than 5 seconds of reflux in the distal calf of the small saphenous vein. Varicosities:  None Other Findings:  None. IMPRESSION: Sonographic survey bilateral lower extremities negative for DVT. Short-segment reflux of the right lower extremity in both great saphenous vein at the proximal calf and small saphenous vein at the ankle. Short segment reflux in the left lower extremity at the knee and in the small saphenous vein. Signed, Yvone Neu. Loreta Ave, DO [facility] Radiology Electronically Signed   By: Patrick Duran D.O.   On: 06/23/2017 13:23   [place] Rad Eval And Mgmt  Result Date: 06/23/2017 Please refer to "Notes" to see consult details.   Labs:  CBC: No results for input(s): WBC, HGB, HCT, PLT in the last 8760 hours.  COAGS: No results for input(s): INR, APTT in the last 8760 hours.  BMP: No results for input(s): NA, K, CL, CO2, GLUCOSE, BUN, CALCIUM, CREATININE, GFRNONAA, GFRAA in the last 8760 hours.  Invalid input(s): CMP  LIVER FUNCTION TESTS: No results for input(s): BILITOT, AST, ALT, ALKPHOS, PROT, ALBUMIN in the last 8760 hours.  TUMOR MARKERS: No results for input(s): AFPTM, CEA, CA199, CHROMGRNA in the last 8760 hours.  Assessment and Plan:  Patrick Duran is a 72 year old male with bilateral CEAP-4 category changes of chronic venous disease.    Although his directed duplex tells [place]  that he has mild venous insufficiency, mostly of the small saphenous vein bilaterally, his symptoms are very mild, and by treating him we would only stand to improve symptoms marginally at best.    I did have a lengthy discussion with Patrick Duran regarding anatomy, pathology, pathophysiology of venous disease, including treatment options.  We also discussed a risk benefit analysis regarding his mild symptoms.  At this time, I gave him my impression which would be for deferring endovascular therapy for compression stockings.  We discussed knee-high compression stocking therapy, which I understand that he does not find to be comfortable, but I did reinforce the benefits for compression.  At the very least, I did let him know that even mild compression therapy from more tolerable stockings/socks is better than none, and I encouraged him to find a comfortable pair.    At this time, I would not perform surveillance duplex, but would be happy to see him back if he has significant progression.  He understands, and I answered all of his questions.   Thank you for this interesting consult.  I greatly enjoyed meeting Patrick Duran and look forward to participating in their care.  A copy of this report was sent to the requesting provider on this date.  Electronically Signed: Gilmer Duran 06/23/2017, 2:20 PM   I spent a total of  40 Minutes   in face to face in clinical consultation, greater than 50% of which was counseling/coordinating care for chronic venous disease, mild insufficiency.

## 2018-05-04 ENCOUNTER — Encounter: Payer: Self-pay | Admitting: Family Medicine

## 2018-05-04 ENCOUNTER — Ambulatory Visit (INDEPENDENT_AMBULATORY_CARE_PROVIDER_SITE_OTHER): Payer: HMO | Admitting: Family Medicine

## 2018-05-04 VITALS — BP 138/62 | HR 66 | Temp 98.4°F | Ht 73.0 in | Wt 237.8 lb

## 2018-05-04 DIAGNOSIS — R42 Dizziness and giddiness: Secondary | ICD-10-CM | POA: Diagnosis not present

## 2018-05-04 DIAGNOSIS — I152 Hypertension secondary to endocrine disorders: Secondary | ICD-10-CM

## 2018-05-04 DIAGNOSIS — K219 Gastro-esophageal reflux disease without esophagitis: Secondary | ICD-10-CM | POA: Diagnosis not present

## 2018-05-04 DIAGNOSIS — E039 Hypothyroidism, unspecified: Secondary | ICD-10-CM | POA: Insufficient documentation

## 2018-05-04 DIAGNOSIS — Z794 Long term (current) use of insulin: Secondary | ICD-10-CM

## 2018-05-04 DIAGNOSIS — H9319 Tinnitus, unspecified ear: Secondary | ICD-10-CM | POA: Diagnosis not present

## 2018-05-04 DIAGNOSIS — E1169 Type 2 diabetes mellitus with other specified complication: Secondary | ICD-10-CM | POA: Diagnosis not present

## 2018-05-04 DIAGNOSIS — E1159 Type 2 diabetes mellitus with other circulatory complications: Secondary | ICD-10-CM

## 2018-05-04 DIAGNOSIS — I4891 Unspecified atrial fibrillation: Secondary | ICD-10-CM

## 2018-05-04 DIAGNOSIS — E119 Type 2 diabetes mellitus without complications: Secondary | ICD-10-CM | POA: Diagnosis not present

## 2018-05-04 DIAGNOSIS — I2510 Atherosclerotic heart disease of native coronary artery without angina pectoris: Secondary | ICD-10-CM

## 2018-05-04 DIAGNOSIS — H9192 Unspecified hearing loss, left ear: Secondary | ICD-10-CM | POA: Insufficient documentation

## 2018-05-04 DIAGNOSIS — I10 Essential (primary) hypertension: Secondary | ICD-10-CM | POA: Diagnosis not present

## 2018-05-04 DIAGNOSIS — E785 Hyperlipidemia, unspecified: Secondary | ICD-10-CM | POA: Diagnosis not present

## 2018-05-04 NOTE — Assessment & Plan Note (Signed)
Stable

## 2018-05-04 NOTE — Assessment & Plan Note (Signed)
Stable.  Continue Protonix 40 mg daily

## 2018-05-04 NOTE — Progress Notes (Addendum)
   Chief Complaint:  Patrick Duran is a 73 y.o. male who presents today with a chief complaint of T2DM and to establish care.   Assessment/Plan:  Vertigo Stable.  Continue meclizine as needed.  Type 2 diabetes mellitus without complication, with long-term current use of insulin (HCC) Last A1c 6.4.  Discussed use of glipizide and Lantus and propensity to cause hypoglycemic events with this.  He wishes to continue with current regimen as he has been stable for several years on this without any symptomatic lows.  We will continue metformin 1000 mg twice daily and his current doses of glipizide and Lantus.  He will follow-up with me in about 6 months.  Tinnitus Stable.  Hypothyroidism Stable.  Continue levothyroxine 125 mcg daily.  Check TSH with next blood draw at follow-up visit in approximately 6 months.  Hypertension associated with diabetes (HCC) At goal.  Continue labetalol 200 g twice daily, hydralazine 10 mg 3 times daily, and lisinopril 10 mg daily.  Dyslipidemia associated with type 2 diabetes mellitus (HCC) Stable.  Continue Lipitor 40 mg daily and current dose of fenofibrate 134 mg daily.  We will follow-up in about 6 months and we will recheck lipid panel at that time.  Consider increasing dose of Lipitor to 80 mg depending on results of lipid panel.  CAD s/p stenting x3 Stable.  Continue statin and aspirin per cardiology.  Atrial fibrillation (HCC) Regular rate and rhythm today.  Continue management per cardiology.  GERD (gastroesophageal reflux disease) Stable.  Continue Protonix 40 mg daily.      Subjective:  HPI:  His stable, chronic medical conditions are outlined below:  # T2DM - On metformin 1000mg  twice daily, lantus 18U, glipizide 10mg  twice daily. Tolerating well. - ROS: No symptomatic low. No polyuria or polydipsia.   # Essential Hypertension - On labetalol 200mg  twice daily, hydralazine 10mg  three times daily, lisinopril 10mg  daily - ROS: No  reported chest pain or shortness of breath  # Hypothyroidism - On synthroid daily and tolerating well  # GERD - On protonix 40mg  daily and tolerating well.   % Dyslipidemia / Coronary Artery Disease s/p stents x3 / Atrial Fibrillation - Follows with cardiology - Dr Jacinto Halim - Currently on aspirin 81mg  daily, lipitor 40mg  daily, fenofibrate 134mg  daily. tolerating well without side effects. - Anticoagulated on eliquis 5mg  twice daily  % Vertigo / Tinnitus / Left Ear Deafness - Follows with neurology - Takes meclizine 12.5mg  three times daily as needed       Objective:  Physical Exam: BP 138/62   Pulse 66   Temp 98.4 F (36.9 C) (Oral)   Ht 6\' 1"  (1.854 m)   Wt 237 lb 12.8 oz (107.9 kg)   SpO2 98%   BMI 31.37 kg/m   Gen: NAD, resting comfortably CV: Regular rate and rhythm with no murmurs appreciated Pulm: Normal work of breathing, clear to auscultation bilaterally with no crackles, wheezes, or rhonchi GI: Normal bowel sounds present. Soft, Nontender, Nondistended. MSK: No edema, cyanosis, or clubbing noted Skin: Warm, dry Neuro: Grossly normal, moves all extremities Psych: Normal affect and thought content     Keviana Guida M. Jimmey Ralph, MD 05/04/2018 3:45 PM

## 2018-05-04 NOTE — Assessment & Plan Note (Signed)
Last A1c 6.4.  Discussed use of glipizide and Lantus and propensity to cause hypoglycemic events with this.  He wishes to continue with current regimen as he has been stable for several years on this without any symptomatic lows.  We will continue metformin 1000 mg twice daily and his current doses of glipizide and Lantus.  He will follow-up with me in about 6 months.

## 2018-05-04 NOTE — Assessment & Plan Note (Signed)
Stable.  Continue Lipitor 40 mg daily and current dose of fenofibrate 134 mg daily.  We will follow-up in about 6 months and we will recheck lipid panel at that time.  Consider increasing dose of Lipitor to 80 mg depending on results of lipid panel.

## 2018-05-04 NOTE — Assessment & Plan Note (Signed)
Stable.  Continue statin and aspirin per cardiology.

## 2018-05-04 NOTE — Patient Instructions (Signed)
It was very nice to see you today!  No changes today.  Please come back to see me in the fall for your annual physical with blood work.  Please come back to see me sooner if needed.  Take care, Dr Jimmey Ralph

## 2018-05-04 NOTE — Assessment & Plan Note (Signed)
Stable.  Continue meclizine as needed.

## 2018-05-04 NOTE — Assessment & Plan Note (Signed)
Stable.  Continue levothyroxine 125 mcg daily.  Check TSH with next blood draw at follow-up visit in approximately 6 months.

## 2018-05-04 NOTE — Assessment & Plan Note (Addendum)
Regular rate and rhythm today.  Continue management per cardiology. 

## 2018-05-04 NOTE — Assessment & Plan Note (Signed)
At goal.  Continue labetalol 200 g twice daily, hydralazine 10 mg 3 times daily, and lisinopril 10 mg daily.

## 2018-05-09 ENCOUNTER — Other Ambulatory Visit: Payer: Self-pay

## 2018-05-09 ENCOUNTER — Telehealth: Payer: Self-pay | Admitting: Family Medicine

## 2018-05-09 MED ORDER — METFORMIN HCL 1000 MG PO TABS: 1000.0000 mg | ORAL_TABLET | Freq: Two times a day (BID) | ORAL | 3 refills | Status: DC

## 2018-05-09 MED ORDER — FENOFIBRATE MICRONIZED 134 MG PO CAPS: 134.0000 mg | ORAL_CAPSULE | Freq: Every day | ORAL | 3 refills | Status: DC

## 2018-05-09 MED ORDER — LEVOTHYROXINE SODIUM 125 MCG PO TABS: 125.0000 ug | ORAL_TABLET | Freq: Every day | ORAL | 3 refills | Status: DC

## 2018-05-09 MED ORDER — MECLIZINE HCL 12.5 MG PO TABS: 12.5000 mg | ORAL_TABLET | Freq: Three times a day (TID) | ORAL | 3 refills | Status: DC | PRN

## 2018-05-09 MED ORDER — PANTOPRAZOLE SODIUM 40 MG PO TBEC: 40.0000 mg | DELAYED_RELEASE_TABLET | Freq: Every day | ORAL | 3 refills | Status: DC

## 2018-05-09 MED ORDER — ATORVASTATIN CALCIUM 40 MG PO TABS: 40.0000 mg | ORAL_TABLET | Freq: Every day | ORAL | 3 refills | Status: DC

## 2018-05-09 MED ORDER — LISINOPRIL 10 MG PO TABS: 10.0000 mg | ORAL_TABLET | Freq: Every day | ORAL | 3 refills | Status: DC

## 2018-05-09 MED ORDER — GLIPIZIDE ER 10 MG PO TB24: 10.0000 mg | ORAL_TABLET | Freq: Two times a day (BID) | ORAL | 3 refills | Status: DC

## 2018-05-09 MED ORDER — INSULIN GLARGINE 100 UNIT/ML SOLOSTAR PEN: 20.0000 [IU] | PEN_INJECTOR | SUBCUTANEOUS | 11 refills | Status: DC

## 2018-05-09 MED ORDER — DIAZEPAM 5 MG PO TABS: 5.0000 mg | ORAL_TABLET | Freq: Four times a day (QID) | ORAL | 0 refills | Status: DC | PRN

## 2018-05-09 NOTE — Telephone Encounter (Signed)
All rx sent to pharmacy.  Diazepam Rx routed to Dr. Jimmey Ralph for approval.

## 2018-05-09 NOTE — Telephone Encounter (Signed)
Ok with me. Please place any necessary orders. 

## 2018-05-09 NOTE — Progress Notes (Signed)
Please advise 

## 2018-05-09 NOTE — Telephone Encounter (Signed)
Please advise.  Ok to fill all?

## 2018-05-09 NOTE — Telephone Encounter (Signed)
Copied from CRM [id number]. Topic: Quick Communication - Rx Refill/Question >> May 09, 2018  8:43 AM Tamela Oddi wrote: Medication: metFORMIN (GLUCOPHAGE) 1000 MG tablet  Patient called to request a refill for the above medication  Preferred Pharmacy (with phone number or street name): [facility] [address] [place] - [address] [place] [phone] (Phone) [phone] (Fax)

## 2018-05-09 NOTE — Telephone Encounter (Signed)
See note

## 2018-05-09 NOTE — Telephone Encounter (Signed)
Refill request for metformin; original prescription written per Dr Jacinto Halim; contacted pt to make him aware; he states that he would Dr Jimmey Ralph to handle authorization of this medication, and all others that were previously filled by [facility];he lists these medications as listed below;  these medications should be sent to Preferred Pharmacy:  [facility] [address] [place] - [address] [place] [phone] (Phone) [phone] (Fax) Atorvastatin Diazepam Fenofibrate Glipizide Insulin Glargine Solostar Pen Levothyroxine Lisinopril Meclizine Metformin Pantoprazole  If any information is needed please call the pt at [phone]; he also says that a message can be left on this voicemail; patient last seen in office by Dr Jacquiline Doe 05/04/2018; will route to office for final disposition.

## 2018-05-18 ENCOUNTER — Other Ambulatory Visit: Payer: Self-pay | Admitting: Cardiology

## 2018-06-04 ENCOUNTER — Emergency Department (HOSPITAL_COMMUNITY): Payer: HMO

## 2018-06-04 ENCOUNTER — Other Ambulatory Visit: Payer: Self-pay

## 2018-06-04 ENCOUNTER — Encounter (HOSPITAL_COMMUNITY): Payer: Self-pay | Admitting: Emergency Medicine

## 2018-06-04 ENCOUNTER — Inpatient Hospital Stay (HOSPITAL_COMMUNITY)
Admission: EM | Admit: 2018-06-04 | Discharge: 2018-06-07 | DRG: 287 | Disposition: A | Discharge status: Home / Self Care | Payer: HMO | Attending: Internal Medicine | Admitting: Internal Medicine

## 2018-06-04 DIAGNOSIS — Z7901 Long term (current) use of anticoagulants: Secondary | ICD-10-CM | POA: Diagnosis not present

## 2018-06-04 DIAGNOSIS — Z885 Allergy status to narcotic agent status: Secondary | ICD-10-CM | POA: Diagnosis not present

## 2018-06-04 DIAGNOSIS — I4891 Unspecified atrial fibrillation: Secondary | ICD-10-CM | POA: Diagnosis present

## 2018-06-04 DIAGNOSIS — R079 Chest pain, unspecified: Secondary | ICD-10-CM | POA: Diagnosis present

## 2018-06-04 DIAGNOSIS — E1159 Type 2 diabetes mellitus with other circulatory complications: Secondary | ICD-10-CM | POA: Diagnosis not present

## 2018-06-04 DIAGNOSIS — I872 Venous insufficiency (chronic) (peripheral): Secondary | ICD-10-CM | POA: Diagnosis not present

## 2018-06-04 DIAGNOSIS — Z91010 Allergy to peanuts: Secondary | ICD-10-CM | POA: Diagnosis not present

## 2018-06-04 DIAGNOSIS — I2510 Atherosclerotic heart disease of native coronary artery without angina pectoris: Secondary | ICD-10-CM | POA: Diagnosis not present

## 2018-06-04 DIAGNOSIS — Z794 Long term (current) use of insulin: Secondary | ICD-10-CM

## 2018-06-04 DIAGNOSIS — Z91012 Allergy to eggs: Secondary | ICD-10-CM

## 2018-06-04 DIAGNOSIS — Z9049 Acquired absence of other specified parts of digestive tract: Secondary | ICD-10-CM

## 2018-06-04 DIAGNOSIS — E11622 Type 2 diabetes mellitus with other skin ulcer: Secondary | ICD-10-CM | POA: Diagnosis present

## 2018-06-04 DIAGNOSIS — R0789 Other chest pain: Secondary | ICD-10-CM | POA: Diagnosis present

## 2018-06-04 DIAGNOSIS — I10 Essential (primary) hypertension: Secondary | ICD-10-CM | POA: Diagnosis present

## 2018-06-04 DIAGNOSIS — Z79890 Hormone replacement therapy (postmenopausal): Secondary | ICD-10-CM | POA: Diagnosis not present

## 2018-06-04 DIAGNOSIS — K219 Gastro-esophageal reflux disease without esophagitis: Secondary | ICD-10-CM | POA: Diagnosis present

## 2018-06-04 DIAGNOSIS — H9192 Unspecified hearing loss, left ear: Secondary | ICD-10-CM | POA: Diagnosis present

## 2018-06-04 DIAGNOSIS — H9193 Unspecified hearing loss, bilateral: Secondary | ICD-10-CM | POA: Diagnosis present

## 2018-06-04 DIAGNOSIS — I480 Paroxysmal atrial fibrillation: Secondary | ICD-10-CM | POA: Diagnosis present

## 2018-06-04 DIAGNOSIS — E1169 Type 2 diabetes mellitus with other specified complication: Secondary | ICD-10-CM | POA: Diagnosis present

## 2018-06-04 DIAGNOSIS — N183 Chronic kidney disease, stage 3 unspecified: Secondary | ICD-10-CM | POA: Diagnosis present

## 2018-06-04 DIAGNOSIS — Z7982 Long term (current) use of aspirin: Secondary | ICD-10-CM | POA: Diagnosis not present

## 2018-06-04 DIAGNOSIS — E119 Type 2 diabetes mellitus without complications: Secondary | ICD-10-CM | POA: Diagnosis not present

## 2018-06-04 DIAGNOSIS — E1122 Type 2 diabetes mellitus with diabetic chronic kidney disease: Secondary | ICD-10-CM | POA: Diagnosis present

## 2018-06-04 DIAGNOSIS — I252 Old myocardial infarction: Secondary | ICD-10-CM | POA: Diagnosis not present

## 2018-06-04 DIAGNOSIS — E669 Obesity, unspecified: Secondary | ICD-10-CM | POA: Diagnosis present

## 2018-06-04 DIAGNOSIS — Z955 Presence of coronary angioplasty implant and graft: Secondary | ICD-10-CM

## 2018-06-04 DIAGNOSIS — L97219 Non-pressure chronic ulcer of right calf with unspecified severity: Secondary | ICD-10-CM | POA: Diagnosis present

## 2018-06-04 DIAGNOSIS — Z01810 Encounter for preprocedural cardiovascular examination: Secondary | ICD-10-CM | POA: Diagnosis not present

## 2018-06-04 DIAGNOSIS — Z6830 Body mass index (BMI) 30.0-30.9, adult: Secondary | ICD-10-CM

## 2018-06-04 DIAGNOSIS — Y831 Surgical operation with implant of artificial internal device as the cause of abnormal reaction of the patient, or of later complication, without mention of misadventure at the time of the procedure: Secondary | ICD-10-CM | POA: Diagnosis present

## 2018-06-04 DIAGNOSIS — Z79899 Other long term (current) drug therapy: Secondary | ICD-10-CM | POA: Diagnosis not present

## 2018-06-04 DIAGNOSIS — T82855A Stenosis of coronary artery stent, initial encounter: Secondary | ICD-10-CM | POA: Diagnosis present

## 2018-06-04 DIAGNOSIS — Z87891 Personal history of nicotine dependence: Secondary | ICD-10-CM | POA: Diagnosis not present

## 2018-06-04 DIAGNOSIS — I25110 Atherosclerotic heart disease of native coronary artery with unstable angina pectoris: Secondary | ICD-10-CM | POA: Diagnosis present

## 2018-06-04 DIAGNOSIS — I214 Non-ST elevation (NSTEMI) myocardial infarction: Secondary | ICD-10-CM | POA: Diagnosis present

## 2018-06-04 DIAGNOSIS — E039 Hypothyroidism, unspecified: Secondary | ICD-10-CM | POA: Diagnosis present

## 2018-06-04 DIAGNOSIS — I200 Unstable angina: Secondary | ICD-10-CM | POA: Diagnosis not present

## 2018-06-04 DIAGNOSIS — Z8249 Family history of ischemic heart disease and other diseases of the circulatory system: Secondary | ICD-10-CM | POA: Diagnosis not present

## 2018-06-04 DIAGNOSIS — E785 Hyperlipidemia, unspecified: Secondary | ICD-10-CM | POA: Diagnosis present

## 2018-06-04 DIAGNOSIS — I129 Hypertensive chronic kidney disease with stage 1 through stage 4 chronic kidney disease, or unspecified chronic kidney disease: Secondary | ICD-10-CM | POA: Diagnosis present

## 2018-06-04 LAB — PROTIME-INR
INR: 1.2 (ref 0.8–1.2)
Prothrombin Time: 14.7 seconds (ref 11.4–15.2)

## 2018-06-04 LAB — TROPONIN I
Troponin I: <0.03 ng/mL (ref <0.03)
Troponin I: <0.03 ng/mL (ref <0.03)

## 2018-06-04 LAB — I-STAT TROPONIN, ED: Troponin i, poc: 0 ng/mL (ref 0.00–0.08)

## 2018-06-04 LAB — BASIC METABOLIC PANEL
Anion gap: 8 (ref 5–15)
BUN: 18 mg/dL (ref 8–23)
CO2: 19 mmol/L — ABNORMAL LOW (ref 22–32)
Calcium: 9.4 mg/dL (ref 8.9–10.3)
Chloride: 111 mmol/L (ref 98–111)
Creatinine, Ser: 1.36 mg/dL — ABNORMAL HIGH (ref 0.61–1.24)
GFR calc Af Amer: >60 mL/min (ref >60)
GFR calc non Af Amer: 52 mL/min — ABNORMAL LOW (ref >60)
Glucose, Bld: 96 mg/dL (ref 70–99)
Potassium: 4.1 mmol/L (ref 3.5–5.1)
Sodium: 138 mmol/L (ref 135–145)

## 2018-06-04 LAB — CBC
HCT: 39.5 % (ref 39.0–52.0)
Hemoglobin: 12.8 g/dL — ABNORMAL LOW (ref 13.0–17.0)
MCH: 30.3 pg (ref 26.0–34.0)
MCHC: 32.4 g/dL (ref 30.0–36.0)
MCV: 93.6 fL (ref 80.0–100.0)
Platelets: 243 10*3/uL (ref 150–400)
RBC: 4.22 MIL/uL (ref 4.22–5.81)
RDW: 12.8 % (ref 11.5–15.5)
WBC: 5.3 10*3/uL (ref 4.0–10.5)
nRBC: 0 % (ref 0.0–0.2)

## 2018-06-04 LAB — GLUCOSE, CAPILLARY: Glucose-Capillary: 127 mg/dL — ABNORMAL HIGH (ref 70–99)

## 2018-06-04 LAB — BRAIN NATRIURETIC PEPTIDE: B Natriuretic Peptide: 125.1 pg/mL — ABNORMAL HIGH (ref 0.0–100.0)

## 2018-06-04 LAB — MAGNESIUM: Magnesium: 1.6 mg/dL — ABNORMAL LOW (ref 1.7–2.4)

## 2018-06-04 LAB — CBG MONITORING, ED: Glucose-Capillary: 109 mg/dL — ABNORMAL HIGH (ref 70–99)

## 2018-06-04 MED ORDER — ONDANSETRON HCL 4 MG/2ML IJ SOLN
4.0000 mg | Freq: Once | INTRAMUSCULAR | Status: AC
  Administered 2018-06-04: 4 mg via INTRAVENOUS
  Filled 2018-06-04: qty 2

## 2018-06-04 MED ORDER — LISINOPRIL 10 MG PO TABS
10.0000 mg | ORAL_TABLET | Freq: Every day | ORAL | Status: DC
  Administered 2018-06-05: 10 mg via ORAL
  Filled 2018-06-04: qty 1

## 2018-06-04 MED ORDER — HYDRALAZINE HCL 25 MG PO TABS
25.0000 mg | ORAL_TABLET | Freq: Every day | ORAL | Status: DC
  Administered 2018-06-04 – 2018-06-07 (×4): 25 mg via ORAL
  Filled 2018-06-04 (×4): qty 1

## 2018-06-04 MED ORDER — FENOFIBRATE 160 MG PO TABS
160.0000 mg | ORAL_TABLET | Freq: Every day | ORAL | Status: DC
  Administered 2018-06-04 – 2018-06-07 (×4): 160 mg via ORAL
  Filled 2018-06-04 (×4): qty 1

## 2018-06-04 MED ORDER — LABETALOL HCL 200 MG PO TABS
200.0000 mg | ORAL_TABLET | Freq: Two times a day (BID) | ORAL | Status: DC
  Administered 2018-06-04 – 2018-06-07 (×6): 200 mg via ORAL
  Filled 2018-06-04 (×6): qty 1

## 2018-06-04 MED ORDER — ACETAMINOPHEN 325 MG PO TABS: 650.0000 mg | ORAL_TABLET | ORAL | Status: DC | PRN

## 2018-06-04 MED ORDER — INSULIN ASPART 100 UNIT/ML ~~LOC~~ SOLN: 0.0000 [IU] | Freq: Every day | SUBCUTANEOUS | Status: DC

## 2018-06-04 MED ORDER — ASPIRIN EC 81 MG PO TBEC
81.0000 mg | DELAYED_RELEASE_TABLET | Freq: Every day | ORAL | Status: DC
  Administered 2018-06-05 – 2018-06-07 (×3): 81 mg via ORAL
  Filled 2018-06-04 (×3): qty 1

## 2018-06-04 MED ORDER — SODIUM CHLORIDE 0.9% FLUSH: 3.0000 mL | Freq: Once | INTRAVENOUS | Status: DC

## 2018-06-04 MED ORDER — LEVOTHYROXINE SODIUM 25 MCG PO TABS
125.0000 ug | ORAL_TABLET | Freq: Every day | ORAL | Status: DC
  Administered 2018-06-05 – 2018-06-07 (×3): 125 ug via ORAL
  Filled 2018-06-04 (×3): qty 1

## 2018-06-04 MED ORDER — ASPIRIN 81 MG PO CHEW
324.0000 mg | CHEWABLE_TABLET | Freq: Once | ORAL | Status: DC
  Filled 2018-06-04: qty 4

## 2018-06-04 MED ORDER — ONDANSETRON HCL 4 MG/2ML IJ SOLN
4.0000 mg | Freq: Four times a day (QID) | INTRAMUSCULAR | Status: DC | PRN
  Administered 2018-06-04: 4 mg via INTRAVENOUS
  Filled 2018-06-04: qty 2

## 2018-06-04 MED ORDER — SODIUM CHLORIDE 0.9% FLUSH: 3.0000 mL | INTRAVENOUS | Status: DC | PRN

## 2018-06-04 MED ORDER — ASPIRIN 81 MG PO CHEW
81.0000 mg | CHEWABLE_TABLET | ORAL | Status: AC
  Administered 2018-06-05: 81 mg via ORAL
  Filled 2018-06-04: qty 1

## 2018-06-04 MED ORDER — HEPARIN (PORCINE) 25000 UT/250ML-% IV SOLN
1500.0000 [IU]/h | INTRAVENOUS | Status: DC
  Administered 2018-06-04: 1250 [IU]/h via INTRAVENOUS
  Administered 2018-06-05: 1500 [IU]/h via INTRAVENOUS
  Filled 2018-06-04 (×3): qty 250

## 2018-06-04 MED ORDER — PANTOPRAZOLE SODIUM 40 MG PO TBEC
40.0000 mg | DELAYED_RELEASE_TABLET | Freq: Every day | ORAL | Status: DC
  Administered 2018-06-04 – 2018-06-07 (×4): 40 mg via ORAL
  Filled 2018-06-04 (×4): qty 1

## 2018-06-04 MED ORDER — NITROGLYCERIN 0.4 MG SL SUBL
0.4000 mg | SUBLINGUAL_TABLET | SUBLINGUAL | Status: DC | PRN
  Administered 2018-06-04: 0.4 mg via SUBLINGUAL
  Filled 2018-06-04: qty 1

## 2018-06-04 MED ORDER — SODIUM CHLORIDE 0.9 % IV SOLN: 250.0000 mL | INTRAVENOUS | Status: DC | PRN

## 2018-06-04 MED ORDER — SODIUM CHLORIDE 0.9 % IV SOLN
INTRAVENOUS | Status: DC
  Administered 2018-06-05 (×2): via INTRAVENOUS

## 2018-06-04 MED ORDER — SODIUM CHLORIDE 0.9 % IV SOLN
INTRAVENOUS | Status: DC
  Administered 2018-06-05: via INTRAVENOUS

## 2018-06-04 MED ORDER — INSULIN ASPART 100 UNIT/ML ~~LOC~~ SOLN: 0.0000 [IU] | Freq: Three times a day (TID) | SUBCUTANEOUS | Status: DC

## 2018-06-04 MED ORDER — ATORVASTATIN CALCIUM 40 MG PO TABS
40.0000 mg | ORAL_TABLET | Freq: Every day | ORAL | Status: DC
  Administered 2018-06-04 – 2018-06-07 (×4): 40 mg via ORAL
  Filled 2018-06-04 (×4): qty 1

## 2018-06-04 MED ORDER — SODIUM CHLORIDE 0.9% FLUSH
3.0000 mL | Freq: Two times a day (BID) | INTRAVENOUS | Status: DC
  Administered 2018-06-04 – 2018-06-05 (×2): 3 mL via INTRAVENOUS

## 2018-06-04 MED ORDER — DIAZEPAM 5 MG PO TABS
5.0000 mg | ORAL_TABLET | Freq: Four times a day (QID) | ORAL | Status: DC | PRN
  Administered 2018-06-05: 5 mg via ORAL
  Filled 2018-06-04 (×2): qty 1

## 2018-06-04 NOTE — Assessment & Plan Note (Signed)
Place patient on sliding scale insulin.  Hold metformin for now.  Hold glipizide since she will be n.p.o. after midnight.

## 2018-06-04 NOTE — Assessment & Plan Note (Signed)
Stable

## 2018-06-04 NOTE — Consult Note (Addendum)
CARDIOLOGY CONSULT NOTE  Patient ID: CYLAR LAPA MRN: [id number] DOB/AGE: 1946-02-20 73 y.o.  Admit date: 06/04/2018 Referring Physician  [facility] Primary Physician:  Ardith Dark, MD Reason for Consultation  Chest pain  HPI:   73 y.o. Caucasian male  with CAD, multiple prior PCI's with recurrent RCA ISR, normal stress test 2017, paroxysmal atrial fibrillation, hypertension, type 2 DM, vertigo, admitted with severe retrosternal chest pain around noon today, associated with nausea.  Pain did not resolve with 4 aspirin, patient called 911 and was brought to the emergency room.  Currently is chest pain-free.  Chest pain was similar to his previous MI episode in 2016.  Patient has history of multiple prior PCI's with in-stent restenosis and right coronary artery, with nonobstructive left sided disease.  Past Medical History:  Diagnosis Date  . Arthritis    "fingers, right hip" (11/07/2014)  . Complication of anesthesia    "trembling during my 1st cath"  . Coronary artery disease   . Easy bruising   . GERD (gastroesophageal reflux disease)   . Headache    "monthly" (11/07/2014)  . 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 History of stomach ulcers   . Hyperlipidemia   . Hypertension   . Hypothyroidism   . Myocardial infarction (HCC) 03/2014  . PONV (postoperative nausea and vomiting)   . Sleep apnea    "probably; didn't want to do the test" (11/07/2014)  . Trouble swallowing   . Type II diabetes mellitus (HCC)   . Vertigo      Past Surgical History:  Procedure Laterality Date  . ANTERIOR CERVICAL DECOMP/DISCECTOMY FUSION  2004  . CARDIAC CATHETERIZATION  2000  . CARDIAC CATHETERIZATION  04/15/2014   Procedure: CORONARY STENT INTERVENTION;  Surgeon: Pamella Pert, MD;  Location: [facility] CATH LAB;  Service: Cardiovascular;;  RCA  . CARDIAC CATHETERIZATION N/A 11/07/2014   Procedure: Left Heart Cath  and Coronary Angiography;  Surgeon: Yates Decamp, MD;  Location: [facility] INVASIVE CV LAB;  Service: Cardiovascular;  Laterality: N/A;  . CORONARY ANGIOPLASTY WITH STENT PLACEMENT  2000; 11/07/2014   "3 stents; 1 stent"  . LAPAROSCOPIC CHOLECYSTECTOMY  1998  . LEFT HEART CATHETERIZATION WITH CORONARY ANGIOGRAM N/A 04/15/2014   Procedure: LEFT HEART CATHETERIZATION WITH CORONARY ANGIOGRAM;  Surgeon: Pamella Pert, MD;  Location: [facility] CATH LAB;  Service: Cardiovascular;  Laterality: N/A;     Family History  Problem Relation Age of Onset  . Diabetes Mother   . Hypothyroidism Mother   . Stroke Father   . Hypertension Father   . Colon cancer Neg Hx   . Stomach cancer Neg Hx   . Rectal cancer Neg Hx   . Esophageal cancer Neg Hx   . Liver cancer Neg Hx      Social History: Social History   Socioeconomic History  . Marital status: Married    Spouse name: Not on file  . Number of children: Not on file  . Years of education: Not on file  . Highest education level: Not on file  Occupational History  . Not on file  Social Needs  . Financial resource strain: Not on file  . Food insecurity:    Worry: Not on file    Inability: Not on file  . Transportation needs:    Medical: Not on file    Non-medical: Not on file  Tobacco Use  . Smoking  status: Former Smoker    Packs/day: 1.50    Years: 34.00    Pack years: 51.00    Types: Cigarettes  . Smokeless tobacco: Never Used  . Tobacco comment: "stopped smoking in ~ 2000"  Substance and Sexual Activity  . Alcohol use: No  . Drug use: No    Comment: 11/07/2014 "experimented in the 1960's and 1970's; nothing since"  . Sexual activity: Not Currently  Lifestyle  . Physical activity:    Days per week: Not on file    Minutes per session: Not on file  . Stress: Not on file  Relationships  . Social connections:    Talks on phone: Not on file    Gets together: Not on file    Attends religious service: Not on file    Active member of club or  organization: Not on file    Attends meetings of clubs or organizations: Not on file    Relationship status: Not on file  . Intimate partner violence:    Fear of current or ex partner: Not on file    Emotionally abused: Not on file    Physically abused: Not on file    Forced sexual activity: Not on file  Other Topics Concern  . Not on file  Social History Narrative  . Not on file     Review of Systems  Constitution: Negative for decreased appetite, malaise/fatigue, weight gain and weight loss.  HENT: Negative for congestion.   Eyes: Negative for visual disturbance.  Cardiovascular: Positive for chest pain (Currently chest pain-free). Negative for dyspnea on exertion, leg swelling, palpitations and syncope.  Respiratory: Negative for shortness of breath.   Endocrine: Negative for cold intolerance.  Hematologic/Lymphatic: Does not bruise/bleed easily.  Skin: Negative for itching and rash.  Musculoskeletal: Negative for myalgias.  Gastrointestinal: Positive for nausea. Negative for abdominal pain and vomiting.  Genitourinary: Negative for dysuria.  Neurological: Negative for dizziness and weakness.  Psychiatric/Behavioral: The patient is not nervous/anxious.   All other systems reviewed and are negative.     Physical Exam: Physical Exam  Constitutional: He is oriented to person, place, and time. He appears well-developed and well-nourished. No distress.  HENT:  Head: Normocephalic and atraumatic.  Eyes: Pupils are equal, round, and reactive to light. Conjunctivae are normal.  Neck: No JVD present.  Cardiovascular: Normal rate, regular rhythm and intact distal pulses.  No murmur heard. Pulmonary/Chest: Effort normal and breath sounds normal. He has no wheezes. He has no rales.  Abdominal: Soft. Bowel sounds are normal. There is no rebound.  Musculoskeletal:        General: Edema (1+ b/l.  Discoloration bilateral legs medial aspect.  Erythema right leg medial aspect.) present.   Lymphadenopathy:    He has no cervical adenopathy.  Neurological: He is alert and oriented to person, place, and time. No cranial nerve deficit.  Skin: Skin is warm and dry.  Psychiatric: He has a normal mood and affect.  Nursing note and vitals reviewed.    Labs:   Lab Results  Component Value Date   WBC 5.3 06/04/2018   HGB 12.8 (L) 06/04/2018   HCT 39.5 06/04/2018   MCV 93.6 06/04/2018   PLT 243 06/04/2018    Recent Labs  Lab 06/04/18 1329  NA 138  K 4.1  CL 111  CO2 19*  BUN 18  CREATININE 1.36*  CALCIUM 9.4  GLUCOSE 96    Lipid Panel     Component Value Date/Time   CHOL  167 04/14/2014 0429   TRIG 341 (H) 04/14/2014 0429   HDL 23 (L) 04/14/2014 0429   CHOLHDL 7.3 04/14/2014 0429   VLDL 68 (H) 04/14/2014 0429   LDLCALC 76 04/14/2014 0429    BNP (last 3 results) No results for input(s): BNP in the last 8760 hours.  HEMOGLOBIN A1C Lab Results  Component Value Date   HGBA1C 7.0 (H) 04/13/2014   MPG 154 (H) 04/13/2014    Cardiac Panel (last 3 results) No results for input(s): CKTOTAL, CKMB, TROPONINI, RELINDX in the last 8760 hours.  Lab Results  Component Value Date   TROPONINI <0.03 11/06/2014     TSH No results for input(s): TSH in the last 8760 hours.    Radiology: Dg Chest Portable 1 View  Result Date: 06/04/2018 CLINICAL DATA:  Chest pain EXAM: PORTABLE CHEST 1 VIEW COMPARISON:  11/05/2014 chest radiograph. FINDINGS: Surgical hardware from ACDF overlies the lower cervical spine. Stable cardiomediastinal silhouette with mild cardiomegaly. No pneumothorax. No pleural effusion. Lungs appear clear, with no acute consolidative airspace disease and no pulmonary edema. IMPRESSION: Stable mild cardiomegaly without pulmonary edema. No active pulmonary disease. Electronically Signed   By: Jason A Poff M.D.   On: 06/04/2018 14:37    Scheduled Meds: . aspirin  324 mg Oral Once  . sodium chloride flush  3 mL Intravenous Once   Continuous  Infusions: PRN Meds:.nitroGLYCERIN  CARDIAC STUDIES:  Prior coronary angiography data: Nonobstrucitve left sided disease RCA with multiple prior stents and ISR Prior stents prox and distal RCA (Details not known)  03/2014: Servere ISR distal RCA (Prior prox and distal CA stents) Overlapping stents, from distal to proximal a 2.5 x 16 mm Synergy, 3.0 x 32 mm overlapping, and 3.0 x 20 mm Synergy stents.   10/2014: Mid and distal severe edge ISR Mid RCA with implantation of a 3.5 x 26 mm resolute integrity Distal RCA stil had residual ISR with intact TIMI 3 flow distally    EKG 06/04/2018: Sinus bradycardia 50 bpm. Inferior T wave inversions are new. Consider ischemia  Echocardiogram: Pending  Assessment & Recommendations:  72 y.o. Caucasian male  with CAD, multiple prior PCI's with recurrent RCA ISR, normal stress test 2017, paroxysmal atrial fibrillation, hypertension, type 2 DM, vertigo, admitted with  Chest pain: History and EKG with inferior T wave inversion changes suggestive of unstable angina/non-STEMI picture.  Continue trending troponin.  Continue aspirin, stop Eliquis and start heparin drip.\ Coronary angiogram with possible intervention, tomorrow afternoon 06/05/2018.  Single-vessel CABG has been discussed in the past.  However, reviewing his prior angiogram, I am unsure he will have targets for surgical revascularization given his proximal to distal RCA stents. I will discuss with CVTS. If CABG not an option,will consider repeat percutaneous revascularization, if necessary.  Will start BrilinP2 Y 12 antiplatelet therapy after coronary angiogram tomorrow.  Continue atorvastatin, labetalol.   Recommend holding metformin and lisinopril, in view of coronary angiogram tomorrow. Consider reducing daily insulin dose to half his regular dose tomorrow, in view of NPO status. Patient can eat breakfast on 06/04/2018. NPO after 10 AM.  PAF: CHA2DS2VASc score 4, annual stroke risk  5% Currently in sinus rhythm.  Hold Eliquis.  Continue heparin.  Venous insufficiency: No ulcer, but has erythema RLL. Consider wound consult. He may need consideration for endovenous ablation in the future.   Breya Cass J Twan Harkin, MD 06/04/2018, 4:17 PM [facility] [place] Pager: [phone] Office: [phone] If no answer Cell [phone]   

## 2018-06-04 NOTE — ED Notes (Signed)
ED TO INPATIENT HANDOFF REPORT  ED Nurse Name and Phone #:  Neldon Labella [phone]  S Name/Age/Gender Patrick Duran 73 y.o. male Room/Bed: 034C/034C  Code Status   Code Status: Full Code  Home/SNF/Other Home Patient oriented to: self, place, time and situation Is this baseline? Yes   Triage Complete: Triage complete  Chief Complaint cp  Triage Note Patient with hx of cardiac stents and MIs, reports chest pain that began about 1 hour ago. He reports that pain is diffusely across his chest and has improved some since starting. Pt took 324mg  asa prior to arrival. Pt reports some sob. resp e/u, nad.    Allergies Allergies  Allergen Reactions  . Codeine Nausea Only  . Codeine Sulfate     REACTION: nausea  . Eggs Or Egg-Derived Products   . Hydrocodone Nausea Only    nausea  . Oxycodone Nausea Only  . Peanut-Containing Drug Products     And all nuts  . Percocet [Oxycodone-Acetaminophen]     nausea    Level of Care/Admitting Diagnosis ED Disposition    ED Disposition Condition Comment   Admit  Hospital Area: [facility] [[id number]]  Level of Care: Cardiac Telemetry [103]  I expect the patient will be discharged within 24 hours: No (not a candidate for 5C-Observation unit)  Diagnosis: Chest pain [062694]  Admitting Physician: Imogene Burn, ERIC [[id number]]  Attending Physician: Imogene Burn, ERIC [[id number]]  PT Class (Do Not Modify): Observation [104]  PT Acc Code (Do Not Modify): Observation [10022]       B Medical/Surgery History Past Medical History:  Diagnosis Date  . Arthritis    "fingers, right hip" (11/07/2014)  . Complication of anesthesia    "trembling during my 1st cath"  . Coronary artery disease   . Easy bruising   . GERD (gastroesophageal reflux disease)   . Headache    "monthly" (11/07/2014)  . 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 History of stomach ulcers   . Hyperlipidemia   .  Hypertension   . Hypothyroidism   . Myocardial infarction (HCC) 03/2014  . PONV (postoperative nausea and vomiting)   . Sleep apnea    "probably; didn't want to do the test" (11/07/2014)  . Trouble swallowing   . Type II diabetes mellitus (HCC)   . Vertigo    Past Surgical History:  Procedure Laterality Date  . ANTERIOR CERVICAL DECOMP/DISCECTOMY FUSION  2004  . CARDIAC CATHETERIZATION  2000  . CARDIAC CATHETERIZATION  04/15/2014   Procedure: CORONARY STENT INTERVENTION;  Surgeon: Pamella Pert, MD;  Location: [facility] CATH LAB;  Service: Cardiovascular;;  RCA  . CARDIAC CATHETERIZATION N/A 11/07/2014   Procedure: Left Heart Cath and Coronary Angiography;  Surgeon: Yates Decamp, MD;  Location: [facility] INVASIVE CV LAB;  Service: Cardiovascular;  Laterality: N/A;  . CORONARY ANGIOPLASTY WITH STENT PLACEMENT  2000; 11/07/2014   "3 stents; 1 stent"  . LAPAROSCOPIC CHOLECYSTECTOMY  1998  . LEFT HEART CATHETERIZATION WITH CORONARY ANGIOGRAM N/A 04/15/2014   Procedure: LEFT HEART CATHETERIZATION WITH CORONARY ANGIOGRAM;  Surgeon: Pamella Pert, MD;  Location: [facility] CATH LAB;  Service: Cardiovascular;  Laterality: N/A;     A IV Location/Drains/Wounds Patient Lines/Drains/Airways Status   Active Line/Drains/Airways    None          Intake/Output Last 24 hours No intake or output data in the 24 hours ending 06/04/18 1810  Labs/Imaging  Results for orders placed or performed during the hospital encounter of 06/04/18 (from the past 48 hour(s))  Basic metabolic panel     Status: Abnormal   Collection Time: 06/04/18  1:29 PM  Result Value Ref Range   Sodium 138 135 - 145 mmol/L   Potassium 4.1 3.5 - 5.1 mmol/L   Chloride 111 98 - 111 mmol/L   CO2 19 (L) 22 - 32 mmol/L   Glucose, Bld 96 70 - 99 mg/dL   BUN 18 8 - 23 mg/dL   Creatinine, Ser 0.31 (H) 0.61 - 1.24 mg/dL   Calcium 9.4 8.9 - 59.4 mg/dL   GFR calc non Af Amer 52 (L) >60 mL/min   GFR calc Af Amer >60 >60 mL/min   Anion gap 8 5 - 15     Comment: Performed at [facility], [address] [place], [place] [address]  CBC     Status: Abnormal   Collection Time: 06/04/18  1:29 PM  Result Value Ref Range   WBC 5.3 4.0 - 10.5 K/uL   RBC 4.22 4.22 - 5.81 MIL/uL   Hemoglobin 12.8 (L) 13.0 - 17.0 g/dL   HCT 92.4 46.2 - 86.3 %   MCV 93.6 80.0 - 100.0 fL   MCH 30.3 26.0 - 34.0 pg   MCHC 32.4 30.0 - 36.0 g/dL   RDW 81.7 71.1 - 65.7 %   Platelets 243 150 - 400 K/uL   nRBC 0.0 0.0 - 0.2 %    Comment: Performed at [facility], [address], [place], [place] [address]  Brain natriuretic peptide (order if patient c/o SOB ONLY)     Status: Abnormal   Collection Time: 06/04/18  1:29 PM  Result Value Ref Range   B Natriuretic Peptide 125.1 (H) 0.0 - 100.0 pg/mL    Comment: Performed at [facility], [address] [place], [place] [address]  Protime-INR     Status: None   Collection Time: 06/04/18  1:29 PM  Result Value Ref Range   Prothrombin Time 14.7 11.4 - 15.2 seconds   INR 1.2 0.8 - 1.2    Comment: (NOTE) INR goal varies based on device and disease states. Performed at [facility], [address], [place], [place] [address]   I-stat troponin, ED     Status: None   Collection Time: 06/04/18  1:33 PM  Result Value Ref Range   Troponin i, poc 0.00 0.00 - 0.08 ng/mL   Comment 3            Comment: Due to the release kinetics of cTnI, a negative result within the first hours of the onset of symptoms does not rule out myocardial infarction with certainty. If myocardial infarction is still suspected, repeat the test at appropriate intervals.   Magnesium     Status: Abnormal   Collection Time: 06/04/18  4:19 PM  Result Value Ref Range   Magnesium 1.6 (L) 1.7 - 2.4 mg/dL    Comment: Performed at [facility], [address], [place], [place] [address]  Troponin I -     Status: None   Collection Time: 06/04/18  4:19 PM  Result Value Ref Range   Troponin I <0.03 <0.03 ng/mL    Comment:  Performed at [facility], [address] [place], [place] [address]  CBG monitoring, ED     Status: Abnormal   Collection Time: 06/04/18  5:56 PM  Result Value  Ref Range   Glucose-Capillary 109 (H) 70 - 99 mg/dL   Dg Chest Portable 1 View  Result Date: 06/04/2018 CLINICAL DATA:  Chest pain EXAM: PORTABLE CHEST 1 VIEW COMPARISON:  11/05/2014 chest radiograph. FINDINGS: Surgical hardware from ACDF overlies the lower cervical spine. Stable cardiomediastinal silhouette with mild cardiomegaly. No pneumothorax. No pleural effusion. Lungs appear clear, with no acute consolidative airspace disease and no pulmonary edema. IMPRESSION: Stable mild cardiomegaly without pulmonary edema. No active pulmonary disease. Electronically Signed   By: Delbert Phenix M.D.   On: 06/04/2018 14:37    Pending Labs Unresulted Labs (From admission, onward)    Start     Ordered   06/06/18 0500  Heparin level (unfractionated)  Daily,   R     06/04/18 1657   06/06/18 0500  APTT  Daily,   R     06/04/18 1657   06/05/18 0300  APTT  Once-Timed,   R     06/04/18 1657   06/05/18 0300  Heparin level (unfractionated)  Once-Timed,   R     06/04/18 1657   06/04/18 1558  Troponin I - Once  Once,   STAT     06/04/18 1557   Signed and Held  Troponin I - Now Then Q3H  Now then every 3 hours,   TIMED     Signed and Held          Vitals/Pain Today's Vitals   06/04/18 1530 06/04/18 1545 06/04/18 1600 06/04/18 1645  BP: (!) 118/59  (!) 123/59   Pulse: (!) 46 (!) 58 (!) 54   Resp: 13 15 14    Temp:      TempSrc:      SpO2: 95% 100% 100%   Weight:   104.3 kg   Height:   6\' 1"  (1.854 m)   PainSc:    0-No pain    Isolation Precautions No active isolations  Medications Medications  sodium chloride flush (NS) 0.9 % injection 3 mL (has no administration in time range)  aspirin chewable tablet 324 mg (324 mg Oral Not Given 06/04/18 1433)  nitroGLYCERIN (NITROSTAT) SL tablet 0.4 mg (0.4 mg Sublingual Given 06/04/18 1348)   heparin ADULT infusion 100 units/mL (25000 units/250mL sodium chloride 0.45%) (has no administration in time range)  ondansetron (ZOFRAN) injection 4 mg (4 mg Intravenous Given 06/04/18 1349)    Mobility walks Low fall risk   Focused Assessments Cardiac Assessment Handoff:    Lab Results  Component Value Date   TROPONINI <0.03 06/04/2018   No results found for: DDIMER Does the Patient currently have chest pain? No     R Recommendations: See Admitting Provider Note  Report given to:   Additional Notes:  Chest pain free after 1 nitro

## 2018-06-04 NOTE — Subjective & Objective (Signed)
GH:WEXHB pain HPI: 73 year old male with a history of diabetes, coronary disease status post multiple stents, hypertension, CKD stage III based creatinine baseline creatinine 1.3-1.4, presents to the ER with 1 to 2-day history of chest pain.  Patient states he has had chest pain beneath both shoulders.  States that he had some chest pain yesterday but suddenly today around 1230 he had chest pain.  Made him nauseous.  No radiation to his pain.  Patient received some nitroglycerin which improved.  Still somewhat nauseous.  Had one episode of vomiting.  Patient states he has loose stools for last several weeks.  Wife states this is related to the patient eating ice cream.  EKG demonstrates sinus bradycardia.  Initial set of cardiac markers are negative.  Patient had a heart cath back in 2016.  Wife states the patient was recommended to have a bypass surgery but he declined.  Patient not having PTCA of his right coronary artery.  Patient follows with Dr. Jacinto Halim with cardiology.

## 2018-06-04 NOTE — H&P (View-Only) (Signed)
CARDIOLOGY CONSULT NOTE  Patient ID: CYLAR LAPA MRN: [id number] DOB/AGE: 1946-02-20 73 y.o.  Admit date: 06/04/2018 Referring Physician  [facility] Primary Physician:  Ardith Dark, MD Reason for Consultation  Chest pain  HPI:   73 y.o. Caucasian male  with CAD, multiple prior PCI's with recurrent RCA ISR, normal stress test 2017, paroxysmal atrial fibrillation, hypertension, type 2 DM, vertigo, admitted with severe retrosternal chest pain around noon today, associated with nausea.  Pain did not resolve with 4 aspirin, patient called 911 and was brought to the emergency room.  Currently is chest pain-free.  Chest pain was similar to his previous MI episode in 2016.  Patient has history of multiple prior PCI's with in-stent restenosis and right coronary artery, with nonobstructive left sided disease.  Past Medical History:  Diagnosis Date  . Arthritis    "fingers, right hip" (11/07/2014)  . Complication of anesthesia    "trembling during my 1st cath"  . Coronary artery disease   . Easy bruising   . GERD (gastroesophageal reflux disease)   . Headache    "monthly" (11/07/2014)  . 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 History of stomach ulcers   . Hyperlipidemia   . Hypertension   . Hypothyroidism   . Myocardial infarction (HCC) 03/2014  . PONV (postoperative nausea and vomiting)   . Sleep apnea    "probably; didn't want to do the test" (11/07/2014)  . Trouble swallowing   . Type II diabetes mellitus (HCC)   . Vertigo      Past Surgical History:  Procedure Laterality Date  . ANTERIOR CERVICAL DECOMP/DISCECTOMY FUSION  2004  . CARDIAC CATHETERIZATION  2000  . CARDIAC CATHETERIZATION  04/15/2014   Procedure: CORONARY STENT INTERVENTION;  Surgeon: Pamella Pert, MD;  Location: [facility] CATH LAB;  Service: Cardiovascular;;  RCA  . CARDIAC CATHETERIZATION N/A 11/07/2014   Procedure: Left Heart Cath  and Coronary Angiography;  Surgeon: Yates Decamp, MD;  Location: [facility] INVASIVE CV LAB;  Service: Cardiovascular;  Laterality: N/A;  . CORONARY ANGIOPLASTY WITH STENT PLACEMENT  2000; 11/07/2014   "3 stents; 1 stent"  . LAPAROSCOPIC CHOLECYSTECTOMY  1998  . LEFT HEART CATHETERIZATION WITH CORONARY ANGIOGRAM N/A 04/15/2014   Procedure: LEFT HEART CATHETERIZATION WITH CORONARY ANGIOGRAM;  Surgeon: Pamella Pert, MD;  Location: [facility] CATH LAB;  Service: Cardiovascular;  Laterality: N/A;     Family History  Problem Relation Age of Onset  . Diabetes Mother   . Hypothyroidism Mother   . Stroke Father   . Hypertension Father   . Colon cancer Neg Hx   . Stomach cancer Neg Hx   . Rectal cancer Neg Hx   . Esophageal cancer Neg Hx   . Liver cancer Neg Hx      Social History: Social History   Socioeconomic History  . Marital status: Married    Spouse name: Not on file  . Number of children: Not on file  . Years of education: Not on file  . Highest education level: Not on file  Occupational History  . Not on file  Social Needs  . Financial resource strain: Not on file  . Food insecurity:    Worry: Not on file    Inability: Not on file  . Transportation needs:    Medical: Not on file    Non-medical: Not on file  Tobacco Use  . Smoking  status: Former Smoker    Packs/day: 1.50    Years: 34.00    Pack years: 51.00    Types: Cigarettes  . Smokeless tobacco: Never Used  . Tobacco comment: "stopped smoking in ~ 2000"  Substance and Sexual Activity  . Alcohol use: No  . Drug use: No    Comment: 11/07/2014 "experimented in the 1960's and 1970's; nothing since"  . Sexual activity: Not Currently  Lifestyle  . Physical activity:    Days per week: Not on file    Minutes per session: Not on file  . Stress: Not on file  Relationships  . Social connections:    Talks on phone: Not on file    Gets together: Not on file    Attends religious service: Not on file    Active member of club or  organization: Not on file    Attends meetings of clubs or organizations: Not on file    Relationship status: Not on file  . Intimate partner violence:    Fear of current or ex partner: Not on file    Emotionally abused: Not on file    Physically abused: Not on file    Forced sexual activity: Not on file  Other Topics Concern  . Not on file  Social History Narrative  . Not on file     Review of Systems  Constitution: Negative for decreased appetite, malaise/fatigue, weight gain and weight loss.  HENT: Negative for congestion.   Eyes: Negative for visual disturbance.  Cardiovascular: Positive for chest pain (Currently chest pain-free). Negative for dyspnea on exertion, leg swelling, palpitations and syncope.  Respiratory: Negative for shortness of breath.   Endocrine: Negative for cold intolerance.  Hematologic/Lymphatic: Does not bruise/bleed easily.  Skin: Negative for itching and rash.  Musculoskeletal: Negative for myalgias.  Gastrointestinal: Positive for nausea. Negative for abdominal pain and vomiting.  Genitourinary: Negative for dysuria.  Neurological: Negative for dizziness and weakness.  Psychiatric/Behavioral: The patient is not nervous/anxious.   All other systems reviewed and are negative.     Physical Exam: Physical Exam  Constitutional: He is oriented to person, place, and time. He appears well-developed and well-nourished. No distress.  HENT:  Head: Normocephalic and atraumatic.  Eyes: Pupils are equal, round, and reactive to light. Conjunctivae are normal.  Neck: No JVD present.  Cardiovascular: Normal rate, regular rhythm and intact distal pulses.  No murmur heard. Pulmonary/Chest: Effort normal and breath sounds normal. He has no wheezes. He has no rales.  Abdominal: Soft. Bowel sounds are normal. There is no rebound.  Musculoskeletal:        General: Edema (1+ b/l.  Discoloration bilateral legs medial aspect.  Erythema right leg medial aspect.) present.   Lymphadenopathy:    He has no cervical adenopathy.  Neurological: He is alert and oriented to person, place, and time. No cranial nerve deficit.  Skin: Skin is warm and dry.  Psychiatric: He has a normal mood and affect.  Nursing note and vitals reviewed.    Labs:   Lab Results  Component Value Date   WBC 5.3 06/04/2018   HGB 12.8 (L) 06/04/2018   HCT 39.5 06/04/2018   MCV 93.6 06/04/2018   PLT 243 06/04/2018    Recent Labs  Lab 06/04/18 1329  NA 138  K 4.1  CL 111  CO2 19*  BUN 18  CREATININE 1.36*  CALCIUM 9.4  GLUCOSE 96    Lipid Panel     Component Value Date/Time   CHOL  167 04/14/2014 0429   TRIG 341 (H) 04/14/2014 0429   HDL 23 (L) 04/14/2014 0429   CHOLHDL 7.3 04/14/2014 0429   VLDL 68 (H) 04/14/2014 0429   LDLCALC 76 04/14/2014 0429    BNP (last 3 results) No results for input(s): BNP in the last 8760 hours.  HEMOGLOBIN A1C Lab Results  Component Value Date   HGBA1C 7.0 (H) 04/13/2014   MPG 154 (H) 04/13/2014    Cardiac Panel (last 3 results) No results for input(s): CKTOTAL, CKMB, TROPONINI, RELINDX in the last 8760 hours.  Lab Results  Component Value Date   TROPONINI <0.03 11/06/2014     TSH No results for input(s): TSH in the last 8760 hours.    Radiology: Dg Chest Portable 1 View  Result Date: 06/04/2018 CLINICAL DATA:  Chest pain EXAM: PORTABLE CHEST 1 VIEW COMPARISON:  11/05/2014 chest radiograph. FINDINGS: Surgical hardware from ACDF overlies the lower cervical spine. Stable cardiomediastinal silhouette with mild cardiomegaly. No pneumothorax. No pleural effusion. Lungs appear clear, with no acute consolidative airspace disease and no pulmonary edema. IMPRESSION: Stable mild cardiomegaly without pulmonary edema. No active pulmonary disease. Electronically Signed   By: Delbert Phenix M.D.   On: 06/04/2018 14:37    Scheduled Meds: . aspirin  324 mg Oral Once  . sodium chloride flush  3 mL Intravenous Once   Continuous  Infusions: PRN Meds:.nitroGLYCERIN  CARDIAC STUDIES:  Prior coronary angiography data: Nonobstrucitve left sided disease RCA with multiple prior stents and ISR Prior stents prox and distal RCA (Details not known)  03/2014: Servere ISR distal RCA (Prior prox and distal CA stents) Overlapping stents, from distal to proximal a 2.5 x 16 mm Synergy, 3.0 x 32 mm overlapping, and 3.0 x 20 mm Synergy stents.   10/2014: Mid and distal severe edge ISR Mid RCA with implantation of a 3.5 x 26 mm resolute integrity Distal RCA stil had residual ISR with intact TIMI 3 flow distally    EKG 06/04/2018: Sinus bradycardia 50 bpm. Inferior T wave inversions are new. Consider ischemia  Echocardiogram: Pending  Assessment & Recommendations:  73 y.o. Caucasian male  with CAD, multiple prior PCI's with recurrent RCA ISR, normal stress test 2017, paroxysmal atrial fibrillation, hypertension, type 2 DM, vertigo, admitted with  Chest pain: History and EKG with inferior T wave inversion changes suggestive of unstable angina/non-STEMI picture.  Continue trending troponin.  Continue aspirin, stop Eliquis and start heparin drip.\ Coronary angiogram with possible intervention, tomorrow afternoon 06/05/2018.  Single-vessel CABG has been discussed in the past.  However, reviewing his prior angiogram, I am unsure he will have targets for surgical revascularization given his proximal to distal RCA stents. I will discuss with CVTS. If CABG not an option,will consider repeat percutaneous revascularization, if necessary.  Will start BrilinP2 Y 12 antiplatelet therapy after coronary angiogram tomorrow.  Continue atorvastatin, labetalol.   Recommend holding metformin and lisinopril, in view of coronary angiogram tomorrow. Consider reducing daily insulin dose to half his regular dose tomorrow, in view of NPO status. Patient can eat breakfast on 06/04/2018. NPO after 10 AM.  PAF: CHA2DS2VASc score 4, annual stroke risk  5% Currently in sinus rhythm.  Hold Eliquis.  Continue heparin.  Venous insufficiency: No ulcer, but has erythema RLL. Consider wound consult. He may need consideration for endovenous ablation in the future.   Elder Negus, MD 06/04/2018, 4:17 PM [facility] [place] Pager: [phone] Office: [phone] If no answer Cell [phone]

## 2018-06-04 NOTE — H&P (Signed)
History and Physical    YADER POLMAN KGY:[id number] DOB: July 11, 1945 DOA: 06/04/2018  PCP: Ardith Dark, MD   Patient coming from: Home  I have personally briefly reviewed patient's old medical records in [facility]  [place]:OVZCH pain HPI: 73 year old male with a history of diabetes, coronary disease status post multiple stents, hypertension, CKD stage III based creatinine baseline creatinine 1.3-1.4, presents to the ER with 1 to 2-day history of chest pain.  Patient states he has had chest pain beneath both shoulders.  States that he had some chest pain yesterday but suddenly today around 1230 he had chest pain.  Made him nauseous.  No radiation to his pain.  Patient received some nitroglycerin which improved.  Still somewhat nauseous.  Had one episode of vomiting.  Patient states he has loose stools for last several weeks.  Wife states this is related to the patient eating ice cream.  EKG demonstrates sinus bradycardia.  Initial set of cardiac markers are negative.  Patient had a heart cath back in 2016.  Wife states the patient was recommended to have a bypass surgery but he declined.  Patient not having PTCA of his right coronary artery.  Patient follows with Dr. Jacinto Halim with cardiology.     ED Course: Chest pain-free in the ER.  Still somewhat nauseous.  First set of markers are negative.  Review of Systems:  Review of Systems  Constitutional: Negative.   HENT: Negative.   Respiratory: Negative.   Cardiovascular: Positive for chest pain.  Gastrointestinal: Positive for diarrhea and nausea.  Genitourinary: Negative.   Musculoskeletal: Negative.   Skin:       Known ulcerations of his bilateral lower extremities venous in nature.  Neurological: Negative.   Endo/Heme/Allergies: Negative.   Psychiatric/Behavioral: Negative.   All other systems reviewed and are negative.   Past Medical History:  Diagnosis Date  . Arthritis    "fingers, right hip" (11/07/2014)  .  Complication of anesthesia    "trembling during my 1st cath"  . Coronary artery disease   . Easy bruising   . GERD (gastroesophageal reflux disease)   . Headache    "monthly" (11/07/2014)  . 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 History of stomach ulcers   . Hyperlipidemia   . Hypertension   . Hypothyroidism   . Myocardial infarction (HCC) 03/2014  . PONV (postoperative nausea and vomiting)   . Sleep apnea    "probably; didn't want to do the test" (11/07/2014)  . Trouble swallowing   . Type II diabetes mellitus (HCC)   . Vertigo     Past Surgical History:  Procedure Laterality Date  . ANTERIOR CERVICAL DECOMP/DISCECTOMY FUSION  2004  . CARDIAC CATHETERIZATION  2000  . CARDIAC CATHETERIZATION  04/15/2014   Procedure: CORONARY STENT INTERVENTION;  Surgeon: Pamella Pert, MD;  Location: [facility] CATH LAB;  Service: Cardiovascular;;  RCA  . CARDIAC CATHETERIZATION N/A 11/07/2014   Procedure: Left Heart Cath and Coronary Angiography;  Surgeon: Yates Decamp, MD;  Location: [facility] INVASIVE CV LAB;  Service: Cardiovascular;  Laterality: N/A;  . CORONARY ANGIOPLASTY WITH STENT PLACEMENT  2000; 11/07/2014   "3 stents; 1 stent"  . LAPAROSCOPIC CHOLECYSTECTOMY  1998  . LEFT HEART CATHETERIZATION WITH CORONARY ANGIOGRAM N/A 04/15/2014   Procedure: LEFT HEART CATHETERIZATION WITH CORONARY ANGIOGRAM;  Surgeon: Pamella Pert, MD;  Location: [facility] CATH LAB;  Service: Cardiovascular;  Laterality: N/A;     reports that he has quit smoking. His smoking use included cigarettes. He has a 51.00 pack-year smoking history. He has never used smokeless tobacco. He reports that he does not drink alcohol or use drugs.  Allergies  Allergen Reactions  . Codeine Nausea Only  . Codeine Sulfate     REACTION: nausea  . Eggs Or Egg-Derived Products   . Hydrocodone Nausea Only    nausea  . Oxycodone Nausea Only  . Peanut-Containing Drug Products       And all nuts  . Percocet [Oxycodone-Acetaminophen]     nausea    Family History  Problem Relation Age of Onset  . Diabetes Mother   . Hypothyroidism Mother   . Stroke Father   . Hypertension Father   . Colon cancer Neg Hx   . Stomach cancer Neg Hx   . Rectal cancer Neg Hx   . Esophageal cancer Neg Hx   . Liver cancer Neg Hx     Prior to Admission medications   Medication Sig Start Date End Date Taking? Authorizing Provider  acetaminophen (TYLENOL) 500 MG tablet Take 500 mg by mouth every 6 (six) hours as needed. For pain   Yes [provider]  Alpha-Lipoic Acid 300 MG CAPS Take by mouth daily.     Yes [provider]  apixaban (ELIQUIS) 5 MG TABS tablet Take 1 tablet (5 mg total) by mouth 2 (two) times daily. 11/08/14  Yes Yates Decamp, MD  aspirin EC 81 MG EC tablet Take 1 tablet (81 mg total) by mouth daily. 04/16/14  Yes Marcy Salvo, NP  atorvastatin (LIPITOR) 40 MG tablet Take 1 tablet (40 mg total) by mouth daily. 05/09/18  Yes Ardith Dark, MD  co-enzyme Q-10 30 MG capsule Take 120 mg by mouth daily.     Yes [provider]  diazepam (VALIUM) 5 MG tablet Take 1 tablet (5 mg total) by mouth every 6 (six) hours as needed for anxiety. 05/09/18  Yes Ardith Dark, MD  dimenhyDRINATE (DRAMAMINE) 50 MG tablet Take 50 mg by mouth every 8 (eight) hours as needed.   Yes [provider]  fenofibrate micronized (LOFIBRA) 134 MG capsule Take 1 capsule (134 mg total) by mouth daily before breakfast. 05/09/18  Yes Ardith Dark, MD  glipiZIDE (GLUCOTROL XL) 10 MG 24 hr tablet Take 1 tablet (10 mg total) by mouth 2 (two) times daily. Patient taking differently: Take 20 mg by mouth daily with breakfast.  05/09/18  Yes Ardith Dark, MD  hydrALAZINE (APRESOLINE) 25 MG tablet Take 25 mg by mouth daily. 05/24/18  Yes [provider]  Insulin Glargine (LANTUS) 100 UNIT/ML Solostar Pen Inject 20 Units into the skin as directed. 15-35 units q  a.m. Patient taking differently: Inject 15-35 Units into the skin as directed. 15-35 units q a.m. 05/09/18  Yes Ardith Dark, MD  labetalol (NORMODYNE) 200 MG tablet Take 200 mg by mouth 2 (two) times daily.   Yes [provider]  levothyroxine (SYNTHROID, LEVOTHROID) 125 MCG tablet Take 1 tablet (125 mcg total) by mouth daily before breakfast. 05/09/18  Yes Ardith Dark, MD  lisinopril (PRINIVIL,ZESTRIL) 10 MG tablet Take 1 tablet (10 mg total) by mouth daily. 05/09/18  Yes Ardith Dark, MD  meclizine (ANTIVERT) 12.5 MG tablet Take 1 tablet (12.5 mg total) by mouth 3 (three) times daily as needed for dizziness. 05/09/18  Yes Ardith Dark, MD  metFORMIN (GLUCOPHAGE) 1000  MG tablet Take 1 tablet (1,000 mg total) by mouth 2 (two) times daily with a meal. 05/09/18  Yes Ardith Dark, MD  Multiple Vitamin (MULTIVITAMIN PO) Take 1 tablet by mouth daily.    Yes [provider]  niacin 100 MG tablet Take 100 mg by mouth daily.   Yes [provider]  pantoprazole (PROTONIX) 40 MG tablet Take 1 tablet (40 mg total) by mouth daily. 05/09/18  Yes Ardith Dark, MD  POTASSIUM GLUCONATE PO Take 1 tablet by mouth daily.    Yes [provider]  vitamin B-12 (CYANOCOBALAMIN) 100 MCG tablet Take 100 mcg by mouth daily.    Yes [provider]  Zinc Acetate, Oral, (ZINC ACETATE PO) Take 1 tablet by mouth daily.    Yes [provider]    Physical Exam: Vitals:   06/04/18 1515 06/04/18 1530 06/04/18 1545 06/04/18 1600  BP: (!) 126/58 (!) 118/59  (!) 123/59  Pulse: (!) 51 (!) 46 (!) 58 (!) 54  Resp: 12 13 15 14   Temp:      TempSrc:      SpO2: 100% 95% 100% 100%    Physical Exam  Nursing note and vitals reviewed. Constitutional: He is oriented to person, place, and time. He appears well-developed and well-nourished.  HENT:  Head: Normocephalic and atraumatic.  Eyes: Pupils are equal, round, and reactive to light.  Neck: JVD present.    Cardiovascular: Regular rhythm. Bradycardia present.  Respiratory: Effort normal. No respiratory distress. He has no wheezes. He has no rales.  GI: Soft. Bowel sounds are normal. He exhibits no distension. There is no abdominal tenderness. There is no rebound.  Musculoskeletal:        General: Edema present.     Comments: Mild to trace +1 pitting pretibial edema.  Neurological: He is alert and oriented to person, place, and time. No cranial nerve deficit.  Skin: Skin is warm and dry.  Healing venous stasis ulcer right lower extremity near his posterior calf.  Psychiatric: He has a normal mood and affect. His behavior is normal. Judgment and thought content normal.     Labs on Admission: I have personally reviewed following labs and imaging studies  CBC: Recent Labs  Lab 06/04/18 1329  WBC 5.3  HGB 12.8*  HCT 39.5  MCV 93.6  PLT 243   Basic Metabolic Panel: Recent Labs  Lab 06/04/18 1329  NA 138  K 4.1  CL 111  CO2 19*  GLUCOSE 96  BUN 18  CREATININE 1.36*  CALCIUM 9.4   GFR: CrCl cannot be calculated (Unknown ideal weight.). Liver Function Tests: No results for input(s): AST, ALT, ALKPHOS, BILITOT, PROT, ALBUMIN in the last 168 hours. No results for input(s): LIPASE, AMYLASE in the last 168 hours. No results for input(s): AMMONIA in the last 168 hours. Coagulation Profile: No results for input(s): INR, PROTIME in the last 168 hours. Cardiac Enzymes: No results for input(s): CKTOTAL, CKMB, CKMBINDEX, TROPONINI in the last 168 hours. BNP (last 3 results) No results for input(s): PROBNP in the last 8760 hours. HbA1C: No results for input(s): HGBA1C in the last 72 hours. CBG: No results for input(s): GLUCAP in the last 168 hours. Lipid Profile: No results for input(s): CHOL, HDL, LDLCALC, TRIG, CHOLHDL, LDLDIRECT in the last 72 hours. Thyroid Function Tests: No results for input(s): TSH, T4TOTAL, FREET4, T3FREE, THYROIDAB in the last 72 hours. Anemia Panel: No  results for input(s): VITAMINB12, FOLATE, FERRITIN, TIBC, IRON, RETICCTPCT in the last  72 hours. Urine analysis:    Component Value Date/Time   COLORURINE YELLOW 02/09/2011 0831   APPEARANCEUR CLEAR 02/09/2011 0831   LABSPEC >1.030 (H) 02/09/2011 0831   PHURINE 5.5 02/09/2011 0831   GLUCOSEU NEGATIVE 02/09/2011 0831   HGBUR NEGATIVE 02/09/2011 0831   BILIRUBINUR NEGATIVE 02/09/2011 0831   KETONESUR NEGATIVE 02/09/2011 0831   PROTEINUR 30 (A) 02/09/2011 0831   UROBILINOGEN 0.2 02/09/2011 0831   NITRITE NEGATIVE 02/09/2011 0831   LEUKOCYTESUR NEGATIVE 02/09/2011 0831    Radiological Exams on Admission: I have personally reviewed images Dg Chest Portable 1 View  Result Date: 06/04/2018 CLINICAL DATA:  Chest pain EXAM: PORTABLE CHEST 1 VIEW COMPARISON:  11/05/2014 chest radiograph. FINDINGS: Surgical hardware from ACDF overlies the lower cervical spine. Stable cardiomediastinal silhouette with mild cardiomegaly. No pneumothorax. No pleural effusion. Lungs appear clear, with no acute consolidative airspace disease and no pulmonary edema. IMPRESSION: Stable mild cardiomegaly without pulmonary edema. No active pulmonary disease. Electronically Signed   By: Delbert Phenix M.D.   On: 06/04/2018 14:37    EKG: I have personally reviewed EKG: sinus bradycardia  Assessment/Plan Principal Problem:   Chest pain Active Problems:   Hypertension associated with diabetes (HCC)   Type 2 diabetes mellitus without complication, with long-term current use of insulin (HCC)   CKD (chronic kidney disease), stage III (HCC) - baseline Scr 1.3   Hypothyroidism   Dyslipidemia associated with type 2 diabetes mellitus (HCC)   CAD s/p stenting x3   Deafness in left ear   GERD (gastroesophageal reflux disease)   Atrial fibrillation (HCC)    Chest pain Observation telemetry bed. Rule out MI with serial markers.  Continue aspirin.  Discussed the case with Dr. Patwardhan(on-call for Dr. Jacinto Halim).  He request the  patient be started on a low-dose IV heparin drip.  Will defer ischemic evaluation to cardiology as the patient may benefit from having left heart catheterization given his severe multivessel disease.  Continue statin.  Continue beta-blocker.  Hypertension associated with diabetes (HCC) Continue his antihypertensives.  Type 2 diabetes mellitus without complication, with long-term current use of insulin (HCC) Place patient on sliding scale insulin.  Hold metformin for now.  Hold glipizide since she will be n.p.o. after midnight.  CKD (chronic kidney disease), stage III (HCC) - baseline Scr 1.3 Known CKD.  Gentle IV fluids after midnight.  Hypothyroidism Stable.  Dyslipidemia associated with type 2 diabetes mellitus (HCC) Continue statin.  CAD s/p stenting x3 Continue aspirin, statin, beta-blocker.  Cardiology to assess the need for ischemic evaluation tomorrow.  Deafness in left ear Chronic.  GERD (gastroesophageal reflux disease) Continue Protonix.  Atrial fibrillation (HCC) Currently in sinus bradycardia. Discussed with cardiology. Will hold eliquis for now.   DVT prophylaxis: IV heparin gtts Code Status: Full Code Family Communication: discussed with pt and wife Beverly at bedside  Disposition Plan: DC to home if cardiac workup is negative  Consults called: Dr. Rosemary Holms @ 4:15 pm  Admission status: Observation, Telemetry bed   Carollee Herter, DO [facility] 06/04/2018, 4:20 PM

## 2018-06-04 NOTE — Progress Notes (Signed)
ANTICOAGULATION CONSULT NOTE - Initial Consult  Pharmacy Consult for heparin Indication: chest pain/ACS  Allergies  Allergen Reactions  . Codeine Nausea Only  . Codeine Sulfate     REACTION: nausea  . Eggs Or Egg-Derived Products   . Hydrocodone Nausea Only    nausea  . Oxycodone Nausea Only  . Peanut-Containing Drug Products     And all nuts  . Percocet [Oxycodone-Acetaminophen]     nausea    Patient Measurements: Height: 6\' 1"  (185.4 cm) Weight: 230 lb (104.3 kg) IBW/kg (Calculated) : 79.9 Heparin Dosing Weight: 101.2 kg  Vital Signs: Temp: 98.3 F (36.8 C) (03/08 1321) Temp Source: Oral (03/08 1321) BP: 123/59 (03/08 1600) Pulse Rate: 54 (03/08 1600)  Labs: Recent Labs    06/04/18 1329  HGB 12.8*  HCT 39.5  PLT 243  LABPROT 14.7  INR 1.2  CREATININE 1.36*    Estimated Creatinine Clearance: 63.2 mL/min (A) (by C-G formula based on SCr of 1.36 mg/dL (H)).   Medical History: Past Medical History:  Diagnosis Date  . Arthritis    "fingers, right hip" (11/07/2014)  . Complication of anesthesia    "trembling during my 1st cath"  . Coronary artery disease   . Easy bruising   . GERD (gastroesophageal reflux disease)   . Headache    "monthly" (11/07/2014)  . 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 History of stomach ulcers   . Hyperlipidemia   . Hypertension   . Hypothyroidism   . Myocardial infarction (HCC) 03/2014  . PONV (postoperative nausea and vomiting)   . Sleep apnea    "probably; didn't want to do the test" (11/07/2014)  . Trouble swallowing   . Type II diabetes mellitus (HCC)   . Vertigo     Medications:  Scheduled:  . aspirin  324 mg Oral Once  . sodium chloride flush  3 mL Intravenous Once    Assessment: 72 yom presenting with chest pain and diaphoresis. Has extensive hx of CAD w/ 3 prior stents. On apixaban PTA for hx Afib- LD 3/8@0700 .   Hgb 12.8, plt 243. No  s/sx of bleeding. No infusion issues.   Will monitor both aPTT and HL until correlate given recent apixaban dosing.   Goal of Therapy:  Heparin level 0.3-0.7 units/ml aPTT 66-102 seconds Monitor platelets by anticoagulation protocol: Yes   Plan:  Start heparin infusion at 1250 units/hr on 3/8@1900  Check anti-Xa and aPTT level in 8 hours and daily while on heparin Continue to monitor H&H and platelets  Sherron Monday, PharmD, BCCCP Clinical Pharmacist  Pager: [phone] Phone: [phone] 06/04/2018,4:45 PM

## 2018-06-04 NOTE — ED Triage Notes (Signed)
Patient with hx of cardiac stents and MIs, reports chest pain that began about 1 hour ago. He reports that pain is diffusely across his chest and has improved some since starting. Pt took 324mg  asa prior to arrival. Pt reports some sob. resp e/u, nad.

## 2018-06-04 NOTE — Assessment & Plan Note (Signed)
Known CKD.  Gentle IV fluids after midnight.

## 2018-06-04 NOTE — Assessment & Plan Note (Signed)
Continue statin. 

## 2018-06-04 NOTE — Assessment & Plan Note (Signed)
Observation telemetry bed. Rule out MI with serial markers.  Continue aspirin.  Discussed the case with Dr. Patwardhan(on-call for Dr. Jacinto Halim).  He request the patient be started on a low-dose IV heparin drip.  Will defer ischemic evaluation to cardiology as the patient may benefit from having left heart catheterization given his severe multivessel disease.  Continue statin.  Continue beta-blocker.

## 2018-06-04 NOTE — Assessment & Plan Note (Signed)
Continue aspirin, statin, beta-blocker.  Cardiology to assess the need for ischemic evaluation tomorrow.

## 2018-06-04 NOTE — Assessment & Plan Note (Signed)
Continue Protonix °

## 2018-06-04 NOTE — Assessment & Plan Note (Signed)
- 

## 2018-06-04 NOTE — ED Provider Notes (Signed)
[facility] EMERGENCY DEPARTMENT Provider Note   CSN: [id number] Arrival date & time: 06/04/18  1316    History   Chief Complaint Chief Complaint  Patient presents with  . Chest Pain    HPI Patrick Duran is a 73 y.o. male.     HPI  Patient presents with concern of chest pain. Onset was about 1 hour ago, the patient was at rest when he felt pressure across his anterior superior chest. Since that time he has taken aspirin, has had slight decrease in the pain level, which is severe, tight. There is associated nausea, multiple episodes of vomiting, this is persistent. There is some diaphoresis, but no syncope, mild lightheadedness only. Patient is coming by his wife, notes that he has had episodes of lightheadedness for the past few days intermittently. They note that the patient also has a history of extensive cardiac disease, with 3 prior stent procedures, and has previously been advised to have CABG. Patient states that he takes all medication as directed including Plavix, regularly.  Past Medical History:  Diagnosis Date  . Arthritis    "fingers, right hip" (11/07/2014)  . Complication of anesthesia    "trembling during my 1st cath"  . Coronary artery disease   . Easy bruising   . GERD (gastroesophageal reflux disease)   . Headache    "monthly" (11/07/2014)  . 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 History of stomach ulcers   . Hyperlipidemia   . Hypertension   . Hypothyroidism   . Myocardial infarction (HCC) 03/2014  . PONV (postoperative nausea and vomiting)   . Sleep apnea    "probably; didn't want to do the test" (11/07/2014)  . Trouble swallowing   . Type II diabetes mellitus (HCC)   . Vertigo     Patient Active Problem List   Diagnosis Date Noted  . Hypertension associated with diabetes (HCC) 05/04/2018  . Type 2 diabetes mellitus without complication, with  long-term current use of insulin (HCC) 05/04/2018  . Hypothyroidism 05/04/2018  . Dyslipidemia associated with type 2 diabetes mellitus (HCC) 05/04/2018  . CAD s/p stenting x3 05/04/2018  . Atrial fibrillation (HCC) 05/04/2018  . Vertigo 05/04/2018  . Tinnitus 05/04/2018  . Deafness in left ear 05/04/2018  . GERD (gastroesophageal reflux disease) 05/04/2018    Past Surgical History:  Procedure Laterality Date  . ANTERIOR CERVICAL DECOMP/DISCECTOMY FUSION  2004  . CARDIAC CATHETERIZATION  2000  . CARDIAC CATHETERIZATION  04/15/2014   Procedure: CORONARY STENT INTERVENTION;  Surgeon: Pamella Pert, MD;  Location[facility] CATH LAB;  Service: Cardiovascular;;  RCA  . CARDIAC CATHETERIZATION N/A 11/07/2014   Procedure: Left Heart Cath and Coronary Angiography;  Surgeon: Yates Decamp, MD;  Location: [facility] INVASIVE CV LAB;  Service: Cardiovascular;  Laterality: N/A;  . CORONARY ANGIOPLASTY WITH STENT PLACEMENT  2000; 11/07/2014   "3 stents; 1 stent"  . LAPAROSCOPIC CHOLECYSTECTOMY  1998  . LEFT HEART CATHETERIZATION WITH CORONARY ANGIOGRAM N/A 04/15/2014   Procedure: LEFT HEART CATHETERIZATION WITH CORONARY ANGIOGRAM;  Surgeon: Pamella Pert, MD;  Location: [facility] CATH LAB;  Service: Cardiovascular;  Laterality: N/A;        Home Medications    Prior to Admission medications   Medication Sig Start Date End Date Taking? Authorizing Provider  acetaminophen (TYLENOL) 500 MG tablet Take 500 mg by mouth every 6 (six) hours as needed. For pain  Yes [provider]  Alpha-Lipoic Acid 300 MG CAPS Take by mouth daily.     Yes [provider]  apixaban (ELIQUIS) 5 MG TABS tablet Take 1 tablet (5 mg total) by mouth 2 (two) times daily. 11/08/14  Yes Yates Decamp, MD  aspirin EC 81 MG EC tablet Take 1 tablet (81 mg total) by mouth daily. 04/16/14  Yes Marcy Salvo, NP  atorvastatin (LIPITOR) 40 MG tablet Take 1 tablet (40 mg total) by mouth daily. 05/09/18  Yes Ardith Dark, MD    co-enzyme Q-10 30 MG capsule Take 120 mg by mouth daily.     Yes [provider]  diazepam (VALIUM) 5 MG tablet Take 1 tablet (5 mg total) by mouth every 6 (six) hours as needed for anxiety. 05/09/18  Yes Ardith Dark, MD  dimenhyDRINATE (DRAMAMINE) 50 MG tablet Take 50 mg by mouth every 8 (eight) hours as needed.   Yes [provider]  fenofibrate micronized (LOFIBRA) 134 MG capsule Take 1 capsule (134 mg total) by mouth daily before breakfast. 05/09/18  Yes Ardith Dark, MD  glipiZIDE (GLUCOTROL XL) 10 MG 24 hr tablet Take 1 tablet (10 mg total) by mouth 2 (two) times daily. Patient taking differently: Take 20 mg by mouth daily with breakfast.  05/09/18  Yes Ardith Dark, MD  hydrALAZINE (APRESOLINE) 25 MG tablet Take 25 mg by mouth daily. 05/24/18  Yes [provider]  Insulin Glargine (LANTUS) 100 UNIT/ML Solostar Pen Inject 20 Units into the skin as directed. 15-35 units q a.m. Patient taking differently: Inject 15-35 Units into the skin as directed. 15-35 units q a.m. 05/09/18  Yes Ardith Dark, MD  labetalol (NORMODYNE) 200 MG tablet Take 200 mg by mouth 2 (two) times daily.   Yes [provider]  levothyroxine (SYNTHROID, LEVOTHROID) 125 MCG tablet Take 1 tablet (125 mcg total) by mouth daily before breakfast. 05/09/18  Yes Ardith Dark, MD  lisinopril (PRINIVIL,ZESTRIL) 10 MG tablet Take 1 tablet (10 mg total) by mouth daily. 05/09/18  Yes Ardith Dark, MD  meclizine (ANTIVERT) 12.5 MG tablet Take 1 tablet (12.5 mg total) by mouth 3 (three) times daily as needed for dizziness. 05/09/18  Yes Ardith Dark, MD  metFORMIN (GLUCOPHAGE) 1000 MG tablet Take 1 tablet (1,000 mg total) by mouth 2 (two) times daily with a meal. 05/09/18  Yes Ardith Dark, MD  Multiple Vitamin (MULTIVITAMIN PO) Take 1 tablet by mouth daily.    Yes [provider]  niacin 100 MG tablet Take 100 mg by mouth daily.   Yes [provider]  pantoprazole  (PROTONIX) 40 MG tablet Take 1 tablet (40 mg total) by mouth daily. 05/09/18  Yes Ardith Dark, MD  POTASSIUM GLUCONATE PO Take 1 tablet by mouth daily.    Yes [provider]  vitamin B-12 (CYANOCOBALAMIN) 100 MCG tablet Take 100 mcg by mouth daily.    Yes [provider]  Zinc Acetate, Oral, (ZINC ACETATE PO) Take 1 tablet by mouth daily.    Yes [provider]    Family History Family History  Problem Relation Age of Onset  . Diabetes Mother   . Hypothyroidism Mother   . Stroke Father   . Hypertension Father   . Colon cancer Neg Hx   . Stomach cancer Neg Hx   . Rectal cancer Neg Hx   . Esophageal cancer Neg Hx   . Liver cancer Neg Hx  Social History Social History   Tobacco Use  . Smoking status: Former Smoker    Packs/day: 1.50    Years: 34.00    Pack years: 51.00    Types: Cigarettes  . Smokeless tobacco: Never Used  . Tobacco comment: "stopped smoking in ~ 2000"  Substance Use Topics  . Alcohol use: No  . Drug use: No    Comment: 11/07/2014 "experimented in the 1960's and 1970's; nothing since"     Allergies   Codeine; Codeine sulfate; Eggs or egg-derived products; Hydrocodone; Oxycodone; Peanut-containing drug products; and Percocet [oxycodone-acetaminophen]   Review of Systems Review of Systems  Constitutional:       Per HPI, otherwise negative  HENT:       Per HPI, otherwise negative  Respiratory:       Per HPI, otherwise negative  Cardiovascular:       Per HPI, otherwise negative  Gastrointestinal: Positive for nausea and vomiting.  Endocrine:       Negative aside from HPI  Genitourinary:       Neg aside from HPI   Musculoskeletal:       Per HPI, otherwise negative  Skin:       Diaphoresis  Neurological: Positive for weakness and light-headedness. Negative for syncope.     Physical Exam Updated Vital Signs BP 139/77 (BP Location: Right Arm)   Pulse (!) 52   Temp 98.3 F (36.8 C) (Oral)   Resp 15   SpO2  100%   Physical Exam Vitals signs and nursing note reviewed.  Constitutional:      Appearance: He is well-developed. He is ill-appearing and diaphoretic.  HENT:     Head: Normocephalic and atraumatic.  Eyes:     Conjunctiva/sclera: Conjunctivae normal.  Cardiovascular:     Rate and Rhythm: Normal rate and regular rhythm.  Pulmonary:     Effort: Pulmonary effort is normal. No respiratory distress.     Breath sounds: No stridor.  Abdominal:     General: There is no distension.  Skin:    General: Skin is warm.  Neurological:     Mental Status: He is alert and oriented to person, place, and time.      ED Treatments / Results  Labs (all labs ordered are listed, but only abnormal results are displayed) Labs Reviewed  BASIC METABOLIC PANEL - Abnormal; Notable for the following components:      Result Value   CO2 19 (*)    Creatinine, Ser 1.36 (*)    GFR calc non Af Amer 52 (*)    All other components within normal limits  CBC - Abnormal; Notable for the following components:   Hemoglobin 12.8 (*)    All other components within normal limits  MAGNESIUM  TROPONIN I  BRAIN NATRIURETIC PEPTIDE  PROTIME-INR  I-STAT TROPONIN, ED  CBG MONITORING, ED    EKG EKG Interpretation  Date/Time:  Sunday June 04 2018 13:20:54 EDT Ventricular Rate:  50 PR Interval:  176 QRS Duration: 88 QT Interval:  422 QTC Calculation: 384 R Axis:   -22 Text Interpretation:  Sinus bradycardia T wave abnormality Abnormal ekg Confirmed by Gerhard Munch [phone]) on 06/04/2018 1:24:20 PM   Radiology Dg Chest Portable 1 View  Result Date: 06/04/2018 CLINICAL DATA:  Chest pain EXAM: PORTABLE CHEST 1 VIEW COMPARISON:  11/05/2014 chest radiograph. FINDINGS: Surgical hardware from ACDF overlies the lower cervical spine. Stable cardiomediastinal silhouette with mild cardiomegaly. No pneumothorax. No pleural effusion. Lungs appear clear, with no acute  consolidative airspace disease and no pulmonary edema.  IMPRESSION: Stable mild cardiomegaly without pulmonary edema. No active pulmonary disease. Electronically Signed   By: Delbert Phenix M.D.   On: 06/04/2018 14:37    Procedures Procedures (including critical care time)  Medications Ordered in ED Medications  sodium chloride flush (NS) 0.9 % injection 3 mL (has no administration in time range)  aspirin chewable tablet 324 mg (324 mg Oral Not Given 06/04/18 1433)  nitroGLYCERIN (NITROSTAT) SL tablet 0.4 mg (0.4 mg Sublingual Given 06/04/18 1348)  ondansetron (ZOFRAN) injection 4 mg (4 mg Intravenous Given 06/04/18 1349)     Initial Impression / Assessment and Plan / ED Course  I have reviewed the triage vital signs and the nursing notes.  Pertinent labs & imaging results that were available during my care of the patient were reviewed by me and considered in my medical decision making (see chart for details).        3:33 PM Pain has resolved after nitroglycerin. Patient remains hemodynamically unremarkable. Initial labs notable for creatinine 1.36, troponin 0.0. EKG is nonischemic.  This elderly male with a history of prior coronary interventions, multiple risk factors for disease, prior recommendation for bypass presents after episode of chest pain.  Pain resolved with aspirin, nitroglycerin, initial findings are reassuring, but given his substantial risk profile he requires admission for further evaluation and management. Patient has no respiratory complaints, no fever, low suspicion for pneumonia No hypoxia, low suspicion for PE.   Final Clinical Impressions(s) / ED Diagnoses  Atypical chest pain   Gerhard Munch, MD 06/04/18 1534

## 2018-06-04 NOTE — Assessment & Plan Note (Signed)
Currently in sinus bradycardia. Discussed with cardiology. Will hold eliquis for now.

## 2018-06-04 NOTE — Assessment & Plan Note (Signed)
Chronic. 

## 2018-06-05 ENCOUNTER — Observation Stay (HOSPITAL_COMMUNITY): Payer: HMO

## 2018-06-05 ENCOUNTER — Encounter (HOSPITAL_COMMUNITY)
Admission: EM | Disposition: A | Discharge status: Home / Self Care | Payer: Self-pay | Source: Home / Self Care | Attending: Internal Medicine

## 2018-06-05 DIAGNOSIS — I214 Non-ST elevation (NSTEMI) myocardial infarction: Secondary | ICD-10-CM

## 2018-06-05 DIAGNOSIS — R0789 Other chest pain: Secondary | ICD-10-CM

## 2018-06-05 DIAGNOSIS — I25110 Atherosclerotic heart disease of native coronary artery with unstable angina pectoris: Secondary | ICD-10-CM

## 2018-06-05 HISTORY — PX: CORONARY ANGIOGRAPHY: CATH118303

## 2018-06-05 LAB — BASIC METABOLIC PANEL WITH GFR
Anion gap: 8 (ref 5–15)
BUN: 17 mg/dL (ref 8–23)
CO2: 21 mmol/L — ABNORMAL LOW (ref 22–32)
Calcium: 9 mg/dL (ref 8.9–10.3)
Chloride: 112 mmol/L — ABNORMAL HIGH (ref 98–111)
Creatinine, Ser: 1.36 mg/dL — ABNORMAL HIGH (ref 0.61–1.24)
GFR calc Af Amer: >60 mL/min
GFR calc non Af Amer: 52 mL/min — ABNORMAL LOW
Glucose, Bld: 114 mg/dL — ABNORMAL HIGH (ref 70–99)
Potassium: 3.8 mmol/L (ref 3.5–5.1)
Sodium: 141 mmol/L (ref 135–145)

## 2018-06-05 LAB — TROPONIN I
Troponin I: <0.03 ng/mL (ref <0.03)
Troponin I: <0.03 ng/mL (ref <0.03)
Troponin I: <0.03 ng/mL (ref <0.03)
Troponin I: <0.03 ng/mL (ref <0.03)
Troponin I: <0.03 ng/mL (ref <0.03)

## 2018-06-05 LAB — HEPARIN LEVEL (UNFRACTIONATED): Heparin Unfractionated: 0.94 IU/mL — ABNORMAL HIGH (ref 0.30–0.70)

## 2018-06-05 LAB — CBC
HCT: 36.1 % — ABNORMAL LOW (ref 39.0–52.0)
Hemoglobin: 12 g/dL — ABNORMAL LOW (ref 13.0–17.0)
MCH: 31 pg (ref 26.0–34.0)
MCHC: 33.2 g/dL (ref 30.0–36.0)
MCV: 93.3 fL (ref 80.0–100.0)
Platelets: 211 K/uL (ref 150–400)
RBC: 3.87 MIL/uL — ABNORMAL LOW (ref 4.22–5.81)
RDW: 13 % (ref 11.5–15.5)
WBC: 5.1 K/uL (ref 4.0–10.5)
nRBC: 0 % (ref 0.0–0.2)

## 2018-06-05 LAB — GLUCOSE, CAPILLARY
Glucose-Capillary: 74 mg/dL (ref 70–99)
Glucose-Capillary: 182 mg/dL — ABNORMAL HIGH (ref 70–99)
Glucose-Capillary: 112 mg/dL — ABNORMAL HIGH (ref 70–99)

## 2018-06-05 LAB — APTT: aPTT: 53 seconds — ABNORMAL HIGH (ref 24–36)

## 2018-06-05 LAB — ECHOCARDIOGRAM COMPLETE
Height: 73 in
Weight: 3681.6 oz

## 2018-06-05 LAB — TSH: TSH: 1.426 u[IU]/mL (ref 0.350–4.500)

## 2018-06-05 SURGERY — CORONARY ANGIOGRAPHY (CATH LAB)
Anesthesia: LOCAL

## 2018-06-05 MED ORDER — SODIUM CHLORIDE 0.9 % IV SOLN
INTRAVENOUS | Status: AC
  Administered 2018-06-05: 19:00:00 via INTRAVENOUS

## 2018-06-05 MED ORDER — ONDANSETRON HCL 4 MG/2ML IJ SOLN
4.0000 mg | INTRAMUSCULAR | Status: AC
  Administered 2018-06-05 (×3): 4 mg via INTRAVENOUS
  Filled 2018-06-05 (×4): qty 2

## 2018-06-05 MED ORDER — MIDAZOLAM HCL 2 MG/2ML IJ SOLN
INTRAMUSCULAR | Status: DC | PRN
  Administered 2018-06-05: 1 mg via INTRAVENOUS

## 2018-06-05 MED ORDER — SODIUM CHLORIDE 0.9% FLUSH
3.0000 mL | Freq: Two times a day (BID) | INTRAVENOUS | Status: DC
  Administered 2018-06-05 – 2018-06-07 (×4): 3 mL via INTRAVENOUS

## 2018-06-05 MED ORDER — HEPARIN (PORCINE) IN NACL 1000-0.9 UT/500ML-% IV SOLN
INTRAVENOUS | Status: AC
  Filled 2018-06-05: qty 1000

## 2018-06-05 MED ORDER — MIDAZOLAM HCL 2 MG/2ML IJ SOLN
INTRAMUSCULAR | Status: AC
  Filled 2018-06-05: qty 2

## 2018-06-05 MED ORDER — LIDOCAINE HCL (PF) 1 % IJ SOLN
INTRAMUSCULAR | Status: DC | PRN
  Administered 2018-06-05: 2 mL

## 2018-06-05 MED ORDER — SODIUM CHLORIDE 0.9 % IV SOLN: 250.0000 mL | INTRAVENOUS | Status: DC | PRN

## 2018-06-05 MED ORDER — HEPARIN (PORCINE) IN NACL 1000-0.9 UT/500ML-% IV SOLN
INTRAVENOUS | Status: DC | PRN
  Administered 2018-06-05 (×2): 500 mL

## 2018-06-05 MED ORDER — ONDANSETRON HCL 4 MG/2ML IJ SOLN: 4.0000 mg | Freq: Four times a day (QID) | INTRAMUSCULAR | Status: DC | PRN

## 2018-06-05 MED ORDER — LIDOCAINE HCL (PF) 1 % IJ SOLN
INTRAMUSCULAR | Status: AC
  Filled 2018-06-05: qty 30

## 2018-06-05 MED ORDER — FENTANYL CITRATE (PF) 100 MCG/2ML IJ SOLN
INTRAMUSCULAR | Status: DC | PRN
  Administered 2018-06-05: 25 ug via INTRAVENOUS

## 2018-06-05 MED ORDER — MAGNESIUM SULFATE 4 GM/100ML IV SOLN
4.0000 g | Freq: Once | INTRAVENOUS | Status: AC
  Administered 2018-06-05: 4 g via INTRAVENOUS
  Filled 2018-06-05: qty 100

## 2018-06-05 MED ORDER — VERAPAMIL HCL 2.5 MG/ML IV SOLN
INTRAVENOUS | Status: AC
  Filled 2018-06-05: qty 2

## 2018-06-05 MED ORDER — HEPARIN SODIUM (PORCINE) 1000 UNIT/ML IJ SOLN
INTRAMUSCULAR | Status: DC | PRN
  Administered 2018-06-05: 5000 [IU] via INTRAVENOUS

## 2018-06-05 MED ORDER — IOHEXOL 350 MG/ML SOLN
INTRAVENOUS | Status: DC | PRN
  Administered 2018-06-05: 45 mL via INTRACARDIAC

## 2018-06-05 MED ORDER — VERAPAMIL HCL 2.5 MG/ML IV SOLN
INTRAVENOUS | Status: DC | PRN
  Administered 2018-06-05: 16:00:00 via INTRA_ARTERIAL

## 2018-06-05 MED ORDER — FENTANYL CITRATE (PF) 100 MCG/2ML IJ SOLN
INTRAMUSCULAR | Status: AC
  Filled 2018-06-05: qty 2

## 2018-06-05 MED ORDER — ACETAMINOPHEN 325 MG PO TABS: 650.0000 mg | ORAL_TABLET | ORAL | Status: DC | PRN

## 2018-06-05 MED ORDER — INSULIN ASPART 100 UNIT/ML ~~LOC~~ SOLN
0.0000 [IU] | SUBCUTANEOUS | Status: DC
  Administered 2018-06-05: 2 [IU] via SUBCUTANEOUS

## 2018-06-05 MED ORDER — SODIUM CHLORIDE 0.9% FLUSH: 3.0000 mL | INTRAVENOUS | Status: DC | PRN

## 2018-06-05 SURGICAL SUPPLY — 10 items
CATH 5FR JL3.5 JR4 ANG PIG MP (CATHETERS) ×2 IMPLANT
DEVICE RAD COMP TR BAND LRG (VASCULAR PRODUCTS) ×2 IMPLANT
GLIDESHEATH SLEND A-KIT 6F 22G (SHEATH) ×2 IMPLANT
GUIDEWIRE INQWIRE 1.5J.035X260 (WIRE) ×1 IMPLANT
INQWIRE 1.5J .035X260CM (WIRE) ×2
KIT HEART LEFT (KITS) ×2 IMPLANT
PACK CARDIAC CATHETERIZATION (CUSTOM PROCEDURE TRAY) ×2 IMPLANT
SHEATH PROBE COVER 6X72 (BAG) ×2 IMPLANT
TRANSDUCER W/STOPCOCK (MISCELLANEOUS) ×2 IMPLANT
TUBING CIL FLEX 10 FLL-RA (TUBING) ×2 IMPLANT

## 2018-06-05 NOTE — Progress Notes (Signed)
[facility] PROGRESS NOTE  Patrick Duran FBP:[id number] DOB: 07-10-1945 DOA: 06/04/2018 PCP: Ardith Dark, MD  Assessment/Plan:  Chest pain. Pain free this am. Troponin negative x3. ekg with inferior T wave inversion, chest xray without cardiopulmonary process. Evaluated by cardiology who opined non-STEMI/unstable angina.  -Continue statin. -Continue beta-blocker. -hold lisinopril and metformin -continue heparin, aspirin -coronary angiogram with possible intervention this afternoon -npo past 10 am  Hypertension associated with diabetes (HCC) -continue BB -monitor closely  Type 2 diabetes mellitus without complication, with long-term current use of insulin (HCC) home meds include lantus, glipizide, metformin -holding home lantus for now -SSI every 4 hours while npo -hold metformin and glipizide. -obtain HgA1c  CKD (chronic kidney disease), stage III (HCC) - baseline Scr 1.3 creatinine 1.36 this am -hold nephrotoxins -gentle IV fluids -monitor urine output -recheck in am  Hypothyroidism Stable.  Dyslipidemia associated with type 2 diabetes mellitus (HCC) Continue statin.  CAD s/p stenting x3 multiple prior PCI's with recurrent RCA ISR, normal stress test 2017. See #1. -Continue aspirin, statin, beta-blocker.  .  Deafness in left ear Chronic.  GERD (gastroesophageal reflux disease) Continue Protonix.  Atrial fibrillation (HCC) Currently in sinus bradycardia. Home meds include BB and eliquis -holding eliquis -continue BB -monitor  Hypomagnesemia.  -repleted  Nausea/vomiting.  -scheduled zofran   Code Status: full Family Communication: wife at bedside Disposition Plan: home when ready   Consultants:  Patwardhan cardiology  Procedures:  Coronary angiogram  Antibiotics:    HPI/Subjective: 73 yo hx cad s/p stent x3, afib admitted cp rule out. Evaluated by cards who opined non-STEMI/unstable angina recommended cath. Chest pain  free this am  Objective: Vitals:   06/05/18 0512 06/05/18 0844  BP: 116/78 110/65  Pulse: 65 (!) 59  Resp: 18   Temp: 97.7 F (36.5 C)   SpO2: 99% 99%    Intake/Output Summary (Last 24 hours) at 06/05/2018 0937 Last data filed at 06/05/2018 0900 Gross per 24 hour  Intake 831 ml  Output 650 ml  Net 181 ml   Filed Weights   06/04/18 1600 06/04/18 1850 06/05/18 0512  Weight: 104.3 kg 104.6 kg 104.4 kg    Exam:   General:  Sitting on side of bed in no acute distress  Cardiovascular: rrr no mgr trace LE edema. ?chronic venous stasis changes  Respiratory: normal effort BS clear bilaterally no wheeze  Abdomen: obese soft +BS no guarding or rebounding  Musculoskeletal: joints without swelling/erythema   Data Reviewed: Basic Metabolic Panel: Recent Labs  Lab 06/04/18 1329 06/04/18 1619 06/05/18 0441  NA 138  --  141  K 4.1  --  3.8  CL 111  --  112*  CO2 19*  --  21*  GLUCOSE 96  --  114*  BUN 18  --  17  CREATININE 1.36*  --  1.36*  CALCIUM 9.4  --  9.0  MG  --  1.6*  --    Liver Function Tests: No results for input(s): AST, ALT, ALKPHOS, BILITOT, PROT, ALBUMIN in the last 168 hours. No results for input(s): LIPASE, AMYLASE in the last 168 hours. No results for input(s): AMMONIA in the last 168 hours. CBC: Recent Labs  Lab 06/04/18 1329  WBC 5.3  HGB 12.8*  HCT 39.5  MCV 93.6  PLT 243   Cardiac Enzymes: Recent Labs  Lab 06/04/18 1619 06/04/18 1956 06/04/18 2311 06/05/18 0209  TROPONINI <0.03 <0.03 <0.03 <0.03   BNP (last 3 results) Recent Labs    06/04/18  1329  BNP 125.1*    ProBNP (last 3 results) No results for input(s): PROBNP in the last 8760 hours.  CBG: Recent Labs  Lab 06/04/18 1756 06/04/18 2114 06/05/18 0625  GLUCAP 109* 127* 74    No results found for this or any previous visit (from the past 240 hour(s)).   Studies: Dg Chest Portable 1 View  Result Date: 06/04/2018 CLINICAL DATA:  Chest pain EXAM: PORTABLE CHEST 1  VIEW COMPARISON:  11/05/2014 chest radiograph. FINDINGS: Surgical hardware from ACDF overlies the lower cervical spine. Stable cardiomediastinal silhouette with mild cardiomegaly. No pneumothorax. No pleural effusion. Lungs appear clear, with no acute consolidative airspace disease and no pulmonary edema. IMPRESSION: Stable mild cardiomegaly without pulmonary edema. No active pulmonary disease. Electronically Signed   By: Delbert Phenix M.D.   On: 06/04/2018 14:37    Scheduled Meds: . aspirin  324 mg Oral Once  . aspirin EC  81 mg Oral Daily  . atorvastatin  40 mg Oral Daily  . fenofibrate  160 mg Oral Daily  . hydrALAZINE  25 mg Oral Daily  . insulin aspart  0-9 Units Subcutaneous Q4H  . labetalol  200 mg Oral BID  . levothyroxine  125 mcg Oral QAC breakfast  . ondansetron (ZOFRAN) IV  4 mg Intravenous Q4H  . pantoprazole  40 mg Oral Daily  . sodium chloride flush  3 mL Intravenous Once  . sodium chloride flush  3 mL Intravenous Q12H   Continuous Infusions: . sodium chloride    . sodium chloride 125 mL/hr at 06/05/18 0654  . heparin 1,500 Units/hr (06/05/18 0345)  . magnesium sulfate 1 - 4 g bolus IVPB      Principal Problem:   NSTEMI (non-ST elevated myocardial infarction) (HCC) Active Problems:   Chest pain   Type 2 diabetes mellitus without complication, with long-term current use of insulin (HCC)   CAD s/p stenting x3   CKD (chronic kidney disease), stage III (HCC) - baseline Scr 1.3   Hypertension associated with diabetes (HCC)   Hypothyroidism   Atrial fibrillation (HCC)   Deafness in left ear   Dyslipidemia associated with type 2 diabetes mellitus (HCC)   GERD (gastroesophageal reflux disease)    Time spent: 45 minutes    [facility] M NP  [facility]  If 7PM-7AM, please contact night-coverage at [email or web address], password [facility] 06/05/2018, 9:37 AM  LOS: 0 days

## 2018-06-05 NOTE — Progress Notes (Signed)
ANTICOAGULATION CONSULT NOTE - Follow Up Consult  Pharmacy Consult for heparin Indication: chest pain/ACS and atrial fibrillation  Labs: Recent Labs    06/04/18 1329  06/04/18 1956 06/04/18 2311 06/05/18 0209  HGB 12.8*  --   --   --   --   HCT 39.5  --   --   --   --   PLT 243  --   --   --   --   APTT  --   --   --   --  53*  LABPROT 14.7  --   --   --   --   INR 1.2  --   --   --   --   HEPARINUNFRC  --   --   --   --  0.94*  CREATININE 1.36*  --   --   --   --   TROPONINI  --    < > <0.03 <0.03 <0.03   < > = values in this interval not displayed.    Assessment: 73yo male subtherapeutic on heparin with initial dosing while Eliquis on hold; no gtt issues or signs of bleeding per RN.  Goal of Therapy:  aPTT 66-102 seconds   Plan:  Will increase heparin gtt by 2-3 units/kg/hr to 1500 units/hr and check PTT in 6 hours.    Vernard Gambles, PharmD, BCPS  06/05/2018,3:36 AM

## 2018-06-05 NOTE — Progress Notes (Signed)
Spoke with Dr. Jacinto Halim regarding pts. HR. Has been sustaining in the low 40's SB post Ca Cath, one episode of HR been on the 30's non sustaining.. Per MD we just need to observe patient for now. Will endorsed accordingly.

## 2018-06-05 NOTE — Progress Notes (Addendum)
Subjective:  No chest pain this morning. Has had nausea and dizziness.  Objective:  Vital Signs in the last 24 hours: Temp:  [97.6 F (36.4 C)-98.3 F (36.8 C)] 97.7 F (36.5 C) (03/09 0512) Pulse Rate:  [46-69] 65 (03/09 0512) Resp:  [9-19] 18 (03/09 0512) BP: (105-151)/(44-78) 116/78 (03/09 0512) SpO2:  [95 %-100 %] 99 % (03/09 0512) Weight:  [104.3 kg-104.6 kg] 104.4 kg (03/09 0512)  Intake/Output from previous day: 03/08 0701 - 03/09 0700 In: 591 [P.O.:480; I.V.:111] Out: 650 [Urine:650]  Physical Exam Constitutional: He is oriented to person, place, and time. He appears well-developed and well-nourished. No distress.  HENT:  Head: Normocephalic and atraumatic.  Eyes: Pupils are equal, round, and reactive to light. Conjunctivae are normal.  Neck: No JVD present.  Cardiovascular: Normal rate, regular rhythm and intact distal pulses.  No murmur heard. Pulmonary/Chest: Effort normal and breath sounds normal. He has no wheezes. He has no rales.  Abdominal: Soft. Bowel sounds are normal. There is no rebound.  Musculoskeletal:        General: Edema (1+ b/l.  Discoloration bilateral legs medial aspect.  Erythema right leg medial aspect.) present.  Lymphadenopathy:    He has no cervical adenopathy.  Neurological: He is alert and oriented to person, place, and time. No cranial nerve deficit.  Skin: Skin is warm and dry.  Psychiatric: He has a normal mood and affect.  Nursing note and vitals reviewed.   Lab Results: BMP Recent Labs    06/04/18 1329 06/05/18 0441  NA 138 141  K 4.1 3.8  CL 111 112*  CO2 19* 21*  GLUCOSE 96 114*  BUN 18 17  CREATININE 1.36* 1.36*  CALCIUM 9.4 9.0  GFRNONAA 52* 52*  GFRAA >60 >60    CBC Recent Labs  Lab 06/04/18 1329  WBC 5.3  RBC 4.22  HGB 12.8*  HCT 39.5  PLT 243  MCV 93.6  MCH 30.3  MCHC 32.4  RDW 12.8    HEMOGLOBIN A1C Lab Results  Component Value Date   HGBA1C 7.0 (H) 04/13/2014   MPG 154 (H) 04/13/2014     Cardiac Panel (last 3 results) Recent Labs    06/04/18 1956 06/04/18 2311 06/05/18 0209  TROPONINI <0.03 <0.03 <0.03    BNP (last 3 results) Recent Labs    06/04/18 1329  BNP 125.1*    TSH No results for input(s): TSH in the last 8760 hours.  Lipid Panel     Component Value Date/Time   CHOL 167 04/14/2014 0429   TRIG 341 (H) 04/14/2014 0429   HDL 23 (L) 04/14/2014 0429   CHOLHDL 7.3 04/14/2014 0429   VLDL 68 (H) 04/14/2014 0429   LDLCALC 76 04/14/2014 0429   LDLDIRECT 106.6 10/12/2007 0906     CARDIAC STUDIES:  Prior coronary angiography data: Nonobstrucitve left sided disease RCA with multiple prior stents and ISR Prior stents prox and distal RCA (Details not known)  03/2014: Servere ISR distal RCA (Prior prox and distal CA stents) Overlapping stents, from distal to proximal a 2.5 x 16 mm Synergy, 3.0 x 32 mm overlapping, and 3.0 x 20 mm Synergy stents.   10/2014: Mid and distal severe edge ISR Mid RCA with implantation of a 3.5 x 26 mm resolute integrity Distal RCA stil had residual ISR with intact TIMI 3 flow distally    EKG 06/04/2018: Sinus bradycardia 50 bpm. Inferior T wave inversions are new. Consider ischemia  Echocardiogram: Pending   Assessment & Recommendations:  73  y/o Caucasian male  with CAD, multiple prior PCI's with recurrent RCA ISR, normal stress test 2017, paroxysmal atrial fibrillation, hypertension, type 2 DM, vertigo, admitted with  Chest pain: History and EKG with inferior T wave inversion changes suggestive of unstable angina, even though troponin has been negative. non-STEMI picture.  Continue trending troponin.    Coronary angiogram with possible intervention, this afternoon. Single-vessel CABG has been discussed in the past.  However, reviewing his prior angiogram, I am unsure he will have targets for surgical revascularization given his proximal to distal RCA stents. I will discuss with CVTS. If CABG not an  option,will consider repeat percutaneous revascularization, if necessary.  Will start P2 Y 12 antiplatelet therapy after coronary angiogram. Continue aspirin, heparin, atorvastatin, labetalol.   Recommend holding metformin and lisinopril, in view of coronary angiogram tomorrow. Consider reducing daily insulin dose to half his regular dose tomorrow, in view of NPO status. Patient can eat breakfast on 06/04/2018. NPO after 10 AM.  PAF: CHA2DS2VASc score 4, annual stroke risk 5% Currently in sinus rhythm.  Hold Eliquis.  Continue heparin.  Venous insufficiency: No ulcer, but has erythema RLL. Consider wound consult. He may need consideration for endovenous ablation in the future.    Elder Negus, M.D. 06/05/2018, 8:12 AM [facility] Pager: [phone] Office: [phone] If no answer: [phone]

## 2018-06-05 NOTE — Interval H&P Note (Signed)
History and Physical Interval Note:  06/05/2018 4:10 PM  Patrick Duran  has presented today for surgery, with the diagnosis of unstable angina.  The various methods of treatment have been discussed with the patient and family. After consideration of risks, benefits and other options for treatment, the patient has consented to  Procedure(s): LEFT HEART CATH AND CORONARY ANGIOGRAPHY (N/A) as a surgical intervention.  The patient's history has been reviewed, patient examined, no change in status, stable for surgery.  I have reviewed the patient's chart and labs.  Questions were answered to the patient's satisfaction.    2016 Appropriate Use Criteria for Coronary Revascularization in Patients With Acute Coronary Syndrome NSTEMI/UA Intermediate Risk (TIMI Score 3-4) NSTEMI/Unstable angina, stabilized patient at Intermediate Risk (TIMI Score 3-4) Link Here: [employer] Indication:  Revascularization by PCI or CABG of 1 or more arteries in a patient with NSTEMI or unstable angina with Stabilization after presentation Intermediate risk for clinical events  A (7) Indication: 16; Score 7     Lydie Stammen J Victorio Creeden

## 2018-06-05 NOTE — Progress Notes (Signed)
  Echocardiogram 2D Echocardiogram has been performed.  Tye Savoy 06/05/2018, 11:42 AM

## 2018-06-06 ENCOUNTER — Other Ambulatory Visit: Payer: Self-pay | Admitting: *Deleted

## 2018-06-06 ENCOUNTER — Encounter (HOSPITAL_COMMUNITY): Payer: Self-pay | Admitting: Cardiology

## 2018-06-06 ENCOUNTER — Inpatient Hospital Stay (HOSPITAL_COMMUNITY): Payer: HMO

## 2018-06-06 DIAGNOSIS — E11622 Type 2 diabetes mellitus with other skin ulcer: Secondary | ICD-10-CM | POA: Diagnosis present

## 2018-06-06 DIAGNOSIS — R079 Chest pain, unspecified: Secondary | ICD-10-CM | POA: Diagnosis not present

## 2018-06-06 DIAGNOSIS — I872 Venous insufficiency (chronic) (peripheral): Secondary | ICD-10-CM | POA: Diagnosis not present

## 2018-06-06 DIAGNOSIS — Z885 Allergy status to narcotic agent status: Secondary | ICD-10-CM | POA: Diagnosis not present

## 2018-06-06 DIAGNOSIS — Z955 Presence of coronary angioplasty implant and graft: Secondary | ICD-10-CM | POA: Diagnosis not present

## 2018-06-06 DIAGNOSIS — R0789 Other chest pain: Secondary | ICD-10-CM | POA: Diagnosis present

## 2018-06-06 DIAGNOSIS — E039 Hypothyroidism, unspecified: Secondary | ICD-10-CM | POA: Diagnosis present

## 2018-06-06 DIAGNOSIS — I480 Paroxysmal atrial fibrillation: Secondary | ICD-10-CM | POA: Diagnosis present

## 2018-06-06 DIAGNOSIS — Y831 Surgical operation with implant of artificial internal device as the cause of abnormal reaction of the patient, or of later complication, without mention of misadventure at the time of the procedure: Secondary | ICD-10-CM | POA: Diagnosis present

## 2018-06-06 DIAGNOSIS — T82855A Stenosis of coronary artery stent, initial encounter: Secondary | ICD-10-CM | POA: Diagnosis present

## 2018-06-06 DIAGNOSIS — Z91010 Allergy to peanuts: Secondary | ICD-10-CM | POA: Diagnosis not present

## 2018-06-06 DIAGNOSIS — H9193 Unspecified hearing loss, bilateral: Secondary | ICD-10-CM | POA: Diagnosis present

## 2018-06-06 DIAGNOSIS — Z794 Long term (current) use of insulin: Secondary | ICD-10-CM | POA: Diagnosis not present

## 2018-06-06 DIAGNOSIS — Z7901 Long term (current) use of anticoagulants: Secondary | ICD-10-CM | POA: Diagnosis not present

## 2018-06-06 DIAGNOSIS — Z01810 Encounter for preprocedural cardiovascular examination: Secondary | ICD-10-CM | POA: Diagnosis not present

## 2018-06-06 DIAGNOSIS — K219 Gastro-esophageal reflux disease without esophagitis: Secondary | ICD-10-CM | POA: Diagnosis present

## 2018-06-06 DIAGNOSIS — L97219 Non-pressure chronic ulcer of right calf with unspecified severity: Secondary | ICD-10-CM | POA: Diagnosis present

## 2018-06-06 DIAGNOSIS — N183 Chronic kidney disease, stage 3 (moderate): Secondary | ICD-10-CM | POA: Diagnosis present

## 2018-06-06 DIAGNOSIS — Z79899 Other long term (current) drug therapy: Secondary | ICD-10-CM | POA: Diagnosis not present

## 2018-06-06 DIAGNOSIS — E785 Hyperlipidemia, unspecified: Secondary | ICD-10-CM | POA: Diagnosis present

## 2018-06-06 DIAGNOSIS — Z87891 Personal history of nicotine dependence: Secondary | ICD-10-CM | POA: Diagnosis not present

## 2018-06-06 DIAGNOSIS — I252 Old myocardial infarction: Secondary | ICD-10-CM | POA: Diagnosis not present

## 2018-06-06 DIAGNOSIS — I2510 Atherosclerotic heart disease of native coronary artery without angina pectoris: Secondary | ICD-10-CM

## 2018-06-06 DIAGNOSIS — I25110 Atherosclerotic heart disease of native coronary artery with unstable angina pectoris: Secondary | ICD-10-CM | POA: Diagnosis present

## 2018-06-06 DIAGNOSIS — I214 Non-ST elevation (NSTEMI) myocardial infarction: Secondary | ICD-10-CM | POA: Diagnosis not present

## 2018-06-06 DIAGNOSIS — Z79890 Hormone replacement therapy (postmenopausal): Secondary | ICD-10-CM | POA: Diagnosis not present

## 2018-06-06 DIAGNOSIS — I200 Unstable angina: Secondary | ICD-10-CM | POA: Diagnosis not present

## 2018-06-06 DIAGNOSIS — E1122 Type 2 diabetes mellitus with diabetic chronic kidney disease: Secondary | ICD-10-CM | POA: Diagnosis present

## 2018-06-06 DIAGNOSIS — Z91012 Allergy to eggs: Secondary | ICD-10-CM | POA: Diagnosis not present

## 2018-06-06 DIAGNOSIS — Z7982 Long term (current) use of aspirin: Secondary | ICD-10-CM | POA: Diagnosis not present

## 2018-06-06 DIAGNOSIS — H9192 Unspecified hearing loss, left ear: Secondary | ICD-10-CM

## 2018-06-06 DIAGNOSIS — Z8249 Family history of ischemic heart disease and other diseases of the circulatory system: Secondary | ICD-10-CM | POA: Diagnosis not present

## 2018-06-06 DIAGNOSIS — I129 Hypertensive chronic kidney disease with stage 1 through stage 4 chronic kidney disease, or unspecified chronic kidney disease: Secondary | ICD-10-CM | POA: Diagnosis present

## 2018-06-06 LAB — HEMOGLOBIN A1C
Hgb A1c MFr Bld: 5.8 % — ABNORMAL HIGH (ref 4.8–5.6)
Mean Plasma Glucose: 119.76 mg/dL

## 2018-06-06 LAB — CBC
HCT: 33.9 % — ABNORMAL LOW (ref 39.0–52.0)
Hemoglobin: 10.9 g/dL — ABNORMAL LOW (ref 13.0–17.0)
MCH: 30.1 pg (ref 26.0–34.0)
MCHC: 32.2 g/dL (ref 30.0–36.0)
MCV: 93.6 fL (ref 80.0–100.0)
Platelets: 184 10*3/uL (ref 150–400)
RBC: 3.62 MIL/uL — ABNORMAL LOW (ref 4.22–5.81)
RDW: 12.8 % (ref 11.5–15.5)
WBC: 4.4 10*3/uL (ref 4.0–10.5)
nRBC: 0 % (ref 0.0–0.2)

## 2018-06-06 LAB — APTT
aPTT: 30 seconds (ref 24–36)
aPTT: 93 seconds — ABNORMAL HIGH (ref 24–36)

## 2018-06-06 LAB — LIPID PANEL
Cholesterol: 112 mg/dL (ref 0–200)
HDL: 23 mg/dL — ABNORMAL LOW (ref >40)
LDL Cholesterol: 61 mg/dL (ref 0–99)
Total CHOL/HDL Ratio: 4.9 RATIO
Triglycerides: 139 mg/dL (ref <150)
VLDL: 28 mg/dL (ref 0–40)

## 2018-06-06 LAB — GLUCOSE, CAPILLARY
Glucose-Capillary: 110 mg/dL — ABNORMAL HIGH (ref 70–99)
Glucose-Capillary: 132 mg/dL — ABNORMAL HIGH (ref 70–99)
Glucose-Capillary: 147 mg/dL — ABNORMAL HIGH (ref 70–99)
Glucose-Capillary: 148 mg/dL — ABNORMAL HIGH (ref 70–99)
Glucose-Capillary: 173 mg/dL — ABNORMAL HIGH (ref 70–99)

## 2018-06-06 LAB — BASIC METABOLIC PANEL
Anion gap: 8 (ref 5–15)
BUN: 12 mg/dL (ref 8–23)
CO2: 21 mmol/L — ABNORMAL LOW (ref 22–32)
Calcium: 8.5 mg/dL — ABNORMAL LOW (ref 8.9–10.3)
Chloride: 109 mmol/L (ref 98–111)
Creatinine, Ser: 1.35 mg/dL — ABNORMAL HIGH (ref 0.61–1.24)
GFR calc Af Amer: >60 mL/min (ref >60)
GFR calc non Af Amer: 52 mL/min — ABNORMAL LOW (ref >60)
Glucose, Bld: 135 mg/dL — ABNORMAL HIGH (ref 70–99)
Potassium: 3.7 mmol/L (ref 3.5–5.1)
Sodium: 138 mmol/L (ref 135–145)

## 2018-06-06 LAB — HEPARIN LEVEL (UNFRACTIONATED)
Heparin Unfractionated: <0.1 IU/mL — ABNORMAL LOW (ref 0.30–0.70)
Heparin Unfractionated: 0.56 IU/mL (ref 0.30–0.70)

## 2018-06-06 MED ORDER — HEPARIN (PORCINE) 25000 UT/250ML-% IV SOLN
1600.0000 [IU]/h | INTRAVENOUS | Status: DC
  Administered 2018-06-06: 1600 [IU]/h via INTRAVENOUS
  Filled 2018-06-06: qty 250

## 2018-06-06 MED ORDER — INSULIN GLARGINE 100 UNIT/ML ~~LOC~~ SOLN: 15.0000 [IU] | Freq: Every day | SUBCUTANEOUS | Status: DC

## 2018-06-06 MED ORDER — INSULIN GLARGINE 100 UNIT/ML ~~LOC~~ SOLN
10.0000 [IU] | Freq: Every day | SUBCUTANEOUS | Status: DC
  Administered 2018-06-06 – 2018-06-07 (×2): 10 [IU] via SUBCUTANEOUS
  Filled 2018-06-06 (×2): qty 0.1

## 2018-06-06 MED ORDER — INSULIN ASPART 100 UNIT/ML ~~LOC~~ SOLN
0.0000 [IU] | Freq: Three times a day (TID) | SUBCUTANEOUS | Status: DC
  Administered 2018-06-06: 2 [IU] via SUBCUTANEOUS

## 2018-06-06 MED ORDER — INSULIN ASPART 100 UNIT/ML ~~LOC~~ SOLN: 0.0000 [IU] | Freq: Every day | SUBCUTANEOUS | Status: DC

## 2018-06-06 MED ORDER — MECLIZINE HCL 25 MG PO TABS: 12.5000 mg | ORAL_TABLET | Freq: Three times a day (TID) | ORAL | Status: DC | PRN

## 2018-06-06 NOTE — Progress Notes (Signed)
Inpatient Diabetes Program Recommendations  AACE/ADA: New Consensus Statement on Inpatient Glycemic Control (2015)  Target Ranges:  Prepandial:   less than 140 mg/dL      Peak postprandial:   less than 180 mg/dL (1-2 hours)      Critically ill patients:  140 - 180 mg/dL   Lab Results  Component Value Date   GLUCAP 132 (H) 06/06/2018   HGBA1C 7.0 (H) 04/13/2014    Review of Glycemic Control Results for Patrick Duran, Patrick Duran (MRN [id number]) as of 06/06/2018 08:55  Ref. Range 06/05/2018 06:25 06/05/2018 12:17 06/05/2018 21:03 06/06/2018 00:12 06/06/2018 04:34  Glucose-Capillary Latest Ref Range: 70 - 99 mg/dL 74 675 (H) 916 (H) 384 (H) 132 (H)   Diabetes history: DM2 Outpatient Diabetes medications: Lantus 15-35 units daily + Glucotrol 20 mg qd + Metformin 1 gm bid  Current orders for Inpatient glycemic control: Lantus 10 units + Novolog sensitive correction tid + hs  Inpatient Diabetes Program Recommendations:   Received consult and agree with insulin orders for hospital. Awaiting A1c results and may consider decrease in Glucotrol on discharge.  Thank you, Billy Fischer. Sajad Glander, RN, MSN, CDE  Diabetes Coordinator Inpatient Glycemic Control Team Team Pager [phone] (8am-5pm) 06/06/2018 9:06 AM

## 2018-06-06 NOTE — Consult Note (Signed)
Reason for Consult: Single-vessel coronary disease with unstable angina Referring Physician: Dr. Patwardhan  Patrick Duran is an 73 y.o. male.  HPI: [name] is a 73-year-old gentleman who presented with a chief complaint of chest pain.  Patrick Duran is a 73-year-old man with a past medical history significant for coronary artery disease, multiple prior percutaneous interventions, hypertension, hyperlipidemia, insulin-dependent type 2 diabetes, ulcers, hypothyroidism, probable sleep apnea, and vertigo.  He has had multiple stents placed in his right coronary over the years.  This dates back about 15 years.  His most recent intervention was in 2016.  He did well after that until 2 days ago when he developed sudden onset of substernal chest pain associated with nausea.  He also felt short of breath.  He took 4 aspirin at home but that did not help.  He did not have any nitroglycerin.  He came to the emergency room and says that the pain went away with "what ever they gave me."  He has not had any recurrent chest pain since admission.  Yesterday he had cardiac catheterization which revealed in-stent restenosis in a heavily stented right coronary artery.  There is no hemodynamically significant disease in the left coronary distribution.  Echocardiogram showed ejection fraction of 55 to 60% with no significant valvular pathology.  Past Medical History:  Diagnosis Date  . Arthritis    "fingers, right hip" (11/07/2014)  . Complication of anesthesia    "trembling during my 1st cath"  . Coronary artery disease   . Easy bruising   . GERD (gastroesophageal reflux disease)   . Headache    "monthly" (11/07/2014)  . 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 History of stomach ulcers   . Hyperlipidemia   . Hypertension   . Hypothyroidism   . Myocardial infarction (HCC) 03/2014  . PONV (postoperative nausea and vomiting)   . Sleep  apnea    "probably; didn't want to do the test" (11/07/2014)  . Trouble swallowing   . Type II diabetes mellitus (HCC)   . Vertigo     Past Surgical History:  Procedure Laterality Date  . ANTERIOR CERVICAL DECOMP/DISCECTOMY FUSION  2004  . CARDIAC CATHETERIZATION  2000  . CARDIAC CATHETERIZATION  04/15/2014   Procedure: CORONARY STENT INTERVENTION;  Surgeon: Jagadeesh R Ganji, MD;  Location: [facility] CATH [facility];  Service: Cardiovascular;;  RCA  . CARDIAC CATHETERIZATION N/A 11/07/2014   Procedure: Left Heart Cath and Coronary Angiography;  Surgeon: Jay Ganji, MD;  Location: [facility] INVASIVE CV [facility];  Service: Cardiovascular;  Laterality: N/A;  . CORONARY ANGIOGRAPHY N/A 06/05/2018   Procedure: CORONARY ANGIOGRAPHY;  Surgeon: Patwardhan, Manish J, MD;  Location: [facility] INVASIVE CV [facility];  Service: Cardiovascular;  Laterality: N/A;  . CORONARY ANGIOPLASTY WITH STENT PLACEMENT  2000; 11/07/2014   "3 stents; 1 stent"  . LAPAROSCOPIC CHOLECYSTECTOMY  1998  . LEFT HEART CATHETERIZATION WITH CORONARY ANGIOGRAM N/A 04/15/2014   Procedure: LEFT HEART CATHETERIZATION WITH CORONARY ANGIOGRAM;  Surgeon: Jagadeesh R Ganji, MD;  Location: [facility]ATH [facility];  Service: Cardiovascular;  Laterality: N/A;    Family History  Problem Relation Age of Onset  . Diabetes Mother   . Hypothyroidism Mother   . Stroke Father   . Hypertension Father   . Colon cancer Neg Hx   . Stomach cancer Neg Hx   . Rectal cancer Neg Hx   . Esophageal cancer Neg Hx   .   Liver cancer Neg Hx     Social History:  reports that he has quit smoking. His smoking use included cigarettes. He has a 51.00 pack-year smoking history. He has never used smokeless tobacco. He reports that he does not drink alcohol or use drugs.  Allergies:  Allergies  Allergen Reactions  . Codeine Nausea Only  . Codeine Sulfate     REACTION: nausea  . Eggs Or Egg-Derived Products   . Hydrocodone Nausea Only    nausea  . Oxycodone Nausea Only  . Peanut-Containing Drug Products      And all nuts  . Percocet [Oxycodone-Acetaminophen]     nausea    Medications:  Scheduled: . aspirin  324 mg Oral Once  . aspirin EC  81 mg Oral Daily  . atorvastatin  40 mg Oral Daily  . fenofibrate  160 mg Oral Daily  . hydrALAZINE  25 mg Oral Daily  . insulin aspart  0-5 Units Subcutaneous QHS  . insulin aspart  0-9 Units Subcutaneous TID WC  . insulin glargine  10 Units Subcutaneous Daily  . labetalol  200 mg Oral BID  . levothyroxine  125 mcg Oral QAC breakfast  . pantoprazole  40 mg Oral Daily  . sodium chloride flush  3 mL Intravenous Once  . sodium chloride flush  3 mL Intravenous Q12H    Results for orders placed or performed during the hospital encounter of 06/04/18 (from the past 48 hour(s))  CBG monitoring, ED     Status: Abnormal   Collection Time: 06/04/18  5:56 PM  Result Value Ref Range   Glucose-Capillary 109 (H) 70 - 99 mg/dL  Troponin I - Now Then Q3H     Status: None   Collection Time: 06/04/18  7:56 PM  Result Value Ref Range   Troponin I <0.03 <0.03 ng/mL    Comment: Performed at [facility], [address] [place], [place] [address]  Glucose, capillary     Status: Abnormal   Collection Time: 06/04/18  9:14 PM  Result Value Ref Range   Glucose-Capillary 127 (H) 70 - 99 mg/dL  Troponin I - Now Then Q3H     Status: None   Collection Time: 06/04/18 11:11 PM  Result Value Ref Range   Troponin I <0.03 <0.03 ng/mL    Comment: Performed at [facility], [address] [place], [place] [address]  APTT     Status: Abnormal   Collection Time: 06/05/18  2:09 AM  Result Value Ref Range   aPTT 53 (H) 24 - 36 seconds    Comment:        IF BASELINE aPTT IS ELEVATED, SUGGEST PATIENT RISK ASSESSMENT BE USED TO DETERMINE APPROPRIATE ANTICOAGULANT THERAPY. Performed at [facility], [address] [place], [place] [address]   Heparin level (unfractionated)     Status: Abnormal   Collection Time: 06/05/18  2:09 AM  Result Value Ref  Range   Heparin Unfractionated 0.94 (H) 0.30 - 0.70 IU/mL    Comment: (NOTE) If heparin results are below expected values, and patient dosage has  been confirmed, suggest follow up testing of antithrombin III levels. Performed at [facility], [address] [place], [place] [address]   Troponin I - Now Then Q3H     Status: None   Collection Time: 06/05/18  2:09 AM  Result Value Ref Range   Troponin I <0.03 <0.03 ng/mL    Comment: Performed at [facility], [address] [place], [place] [address]  Basic metabolic panel     Status: Abnormal   Collection Time: 06/05/18  4:41 AM  Result Value Ref Range   Sodium 141 135 - 145 mmol/L   Potassium 3.8 3.5 - 5.1 mmol/L   Chloride 112 (H) 98 - 111 mmol/L   CO2 21 (L) 22 - 32 mmol/L   Glucose, Bld 114 (H) 70 - 99 mg/dL   BUN 17 8 - 23 mg/dL   Creatinine, Ser 1.36 (H) 0.61 - 1.24 mg/dL   Calcium 9.0 8.9 - 10.3 mg/dL   GFR calc non Af Amer 52 (L) >60 mL/min   GFR calc Af Amer >60 >60 mL/min   Anion gap 8 5 - 15    Comment: Performed at [facility], [address] [place], [place] [address]  Glucose, capillary     Status: None   Collection Time: 06/05/18  6:25 AM  Result Value Ref Range   Glucose-Capillary 74 70 - 99 mg/dL  CBC     Status: Abnormal   Collection Time: 06/05/18  9:27 AM  Result Value Ref Range   WBC 5.1 4.0 - 10.5 K/uL   RBC 3.87 (L) 4.22 - 5.81 MIL/uL   Hemoglobin 12.0 (L) 13.0 - 17.0 g/dL   HCT 36.1 (L) 39.0 - 52.0 %   MCV 93.3 80.0 - 100.0 fL   MCH 31.0 26.0 - 34.0 pg   MCHC 33.2 30.0 - 36.0 g/dL   RDW 13.0 11.5 - 15.5 %   Platelets 211 150 - 400 K/uL   nRBC 0.0 0.0 - 0.2 %    Comment: Performed at [facility], [address] [place], [place] [address]  Troponin I - Now Then Q6H     Status: None   Collection Time: 06/05/18  9:27 AM  Result Value Ref Range   Troponin I <0.03 <0.03 ng/mL    Comment: Performed at [facility], [address] [place], [place] [address]   Glucose, capillary     Status: Abnormal   Collection Time: 06/05/18 12:17 PM  Result Value Ref Range   Glucose-Capillary 182 (H) 70 - 99 mg/dL  Troponin I - Now Then Q6H     Status: None   Collection Time: 06/05/18  2:33 PM  Result Value Ref Range   Troponin I <0.03 <0.03 ng/mL    Comment: Performed at [facility], [address] [place], [place] [address]  TSH     Status: None   Collection Time: 06/05/18  2:33 PM  Result Value Ref Range   TSH 1.426 0.350 - 4.500 uIU/mL    Comment: Performed by a 3rd Generation assay with a functional sensitivity of <=0.01 uIU/mL. Performed at [facility], [address] [place], [place] [address]   Troponin I - Now Then Q6H     Status: None   Collection Time: 06/05/18  8:52 PM  Result Value Ref Range   Troponin I <0.03 <0.03 ng/mL    Comment: Performed at [facility], [address] [place], [place] [address]  Glucose, capillary     Status: Abnormal   Collection Time: 06/05/18  9:03 PM  Result Value Ref Range   Glucose-Capillary 112 (H) 70 - 99 mg/dL  Glucose, capillary     Status: Abnormal   Collection Time: 06/06/18 12:12 AM  Result Value Ref Range   Glucose-Capillary 148 (H) 70 - 99 mg/dL    Glucose, capillary     Status: Abnormal   Collection Time: 06/06/18  4:34 AM  Result Value Ref Range   Glucose-Capillary 132 (H) 70 - 99 mg/dL  Heparin level (unfractionated)     Status: Abnormal   Collection Time: 06/06/18  5:15 AM  Result Value Ref Range   Heparin Unfractionated <0.10 (L) 0.30 - 0.70 IU/mL    Comment: (NOTE) If heparin results are below expected values, and patient dosage has  been confirmed, suggest follow up testing of antithrombin III levels. Performed at [facility], [address] [place], [place] [address]   CBC     Status: Abnormal   Collection Time: 06/06/18  5:15 AM  Result Value Ref Range   WBC 4.4 4.0 - 10.5 K/uL   RBC 3.62 (L) 4.22 - 5.81 MIL/uL   Hemoglobin 10.9 (L) 13.0 - 17.0  g/dL   HCT 33.9 (L) 39.0 - 52.0 %   MCV 93.6 80.0 - 100.0 fL   MCH 30.1 26.0 - 34.0 pg   MCHC 32.2 30.0 - 36.0 g/dL   RDW 12.8 11.5 - 15.5 %   Platelets 184 150 - 400 K/uL   nRBC 0.0 0.0 - 0.2 %    Comment: Performed at [facility], [address] [place], [place] [address]  Basic metabolic panel     Status: Abnormal   Collection Time: 06/06/18  5:15 AM  Result Value Ref Range   Sodium 138 135 - 145 mmol/L   Potassium 3.7 3.5 - 5.1 mmol/L   Chloride 109 98 - 111 mmol/L   CO2 21 (L) 22 - 32 mmol/L   Glucose, Bld 135 (H) 70 - 99 mg/dL   BUN 12 8 - 23 mg/dL   Creatinine, Ser 1.35 (H) 0.61 - 1.24 mg/dL   Calcium 8.5 (L) 8.9 - 10.3 mg/dL   GFR calc non Af Amer 52 (L) >60 mL/min   GFR calc Af Amer >60 >60 mL/min   Anion gap 8 5 - 15    Comment: Performed at [facility], [address] [place], [place] [address]  Lipid panel     Status: Abnormal   Collection Time: 06/06/18  5:15 AM  Result Value Ref Range   Cholesterol 112 0 - 200 mg/dL   Triglycerides 139 <150 mg/dL   HDL 23 (L) >40 mg/dL   Total CHOL/HDL Ratio 4.9 RATIO   VLDL 28 0 - 40 mg/dL   LDL Cholesterol 61 0 - 99 mg/dL    Comment:        Total Cholesterol/HDL:CHD Risk Coronary Heart Disease Risk Table                     Men   Women  1/2 Average Risk   3.4   3.3  Average Risk       5.0   4.4  2 X Average Risk   9.6   7.1  3 X Average Risk  23.4   11.0        Use the calculated Patient Ratio above and the CHD Risk Table to determine the patient's CHD Risk.        ATP III CLASSIFICATION (LDL):  <100     mg/dL   Optimal  100-129  mg/dL   Near or Above                    Optimal  130-159  mg/dL   Borderline    160-189  mg/dL   High  >190     mg/dL   Very High Performed at [facility], [address] [place], [place] [address]   APTT     Status: None   Collection Time: 06/06/18  5:15 AM  Result Value Ref Range   aPTT 30 24 - 36 seconds    Comment: Performed at [facility], [address] [place], [place] [address]  Hemoglobin A1c     Status: Abnormal   Collection Time: 06/06/18  5:15 AM  Result Value Ref Range   Hgb A1c MFr Bld 5.8 (H) 4.8 - 5.6 %    Comment: (NOTE) Pre diabetes:          5.7%-6.4% Diabetes:              >6.4% Glycemic control for   <7.0% adults with diabetes    Mean Plasma Glucose 119.76 mg/dL    Comment: Performed at [facility], [address] [place], [place] [address]  Glucose, capillary     Status: Abnormal   Collection Time: 06/06/18 12:06 PM  Result Value Ref Range   Glucose-Capillary 173 (H) 70 - 99 mg/dL  Glucose, capillary     Status: Abnormal   Collection Time: 06/06/18  4:39 PM  Result Value Ref Range   Glucose-Capillary 110 (H) 70 - 99 mg/dL    Vas Us Doppler Pre Cabg  Result Date: 06/06/2018 PREOPERATIVE VASCULAR EVALUATION  Indications:  Pre cabg. Risk Factors: Hypertension, hyperlipidemia, Diabetes. Performing Technologist: Megan Riddle RVS  Examination Guidelines: A complete evaluation includes B-mode imaging, spectral Doppler, color Doppler, and power Doppler as needed of all accessible portions of each vessel. Bilateral testing is considered an integral part of a complete examination. Limited examinations for reoccurring indications may be performed as noted.  Right Carotid Findings: +----------+--------+--------+--------+-----------+--------+           PSV cm/sEDV cm/sStenosisDescribe   Comments +----------+--------+--------+--------+-----------+--------+ CCA Prox  76      16              homogeneous         +----------+--------+--------+--------+-----------+--------+ CCA Distal76      16              homogeneous         +----------+--------+--------+--------+-----------+--------+ ICA Prox  117     31      1-39%   homogeneous         +----------+--------+--------+--------+-----------+--------+ ICA Distal90      28                                   +----------+--------+--------+--------+-----------+--------+ ECA       175                                         +----------+--------+--------+--------+-----------+--------+ Portions of this table do not appear on this page. +----------+--------+-------+--------+------------+           PSV cm/sEDV cmsDescribeArm Pressure +----------+--------+-------+--------+------------+ Subclavian147                    134          +----------+--------+-------+--------+------------+ +---------+--------+--+--------+-+---------+ VertebralPSV cm/s51EDV cm/s8Antegrade +---------+--------+--+--------+-+---------+ Left Carotid Findings: +----------+--------+--------+--------+-----------+--------+           PSV cm/sEDV cm/sStenosisDescribe     Comments +----------+--------+--------+--------+-----------+--------+ CCA Prox  114     19              homogeneous         +----------+--------+--------+--------+-----------+--------+ CCA Distal88      15              homogeneous         +----------+--------+--------+--------+-----------+--------+ ICA Prox  73      18      1-39%   homogeneous         +----------+--------+--------+--------+-----------+--------+ ICA Distal61      15                                  +----------+--------+--------+--------+-----------+--------+ ECA       79                                          +----------+--------+--------+--------+-----------+--------+ +----------+--------+--------+--------+------------+ SubclavianPSV cm/sEDV cm/sDescribeArm Pressure +----------+--------+--------+--------+------------+           98                      145          +----------+--------+--------+--------+------------+ +---------+--------+--+--------+--+---------+ VertebralPSV cm/s36EDV cm/s10Antegrade +---------+--------+--+--------+--+---------+  ABI Findings: +--------+------------------+-----+---------+--------+ Right   Rt Pressure  (mmHg)IndexWaveform Comment  +--------+------------------+-----+---------+--------+ Brachial134                    triphasic         +--------+------------------+-----+---------+--------+ PTA     188               1.30 triphasic         +--------+------------------+-----+---------+--------+ DP      172               1.19 triphasic         +--------+------------------+-----+---------+--------+ +--------+------------------+-----+---------+-------+ Left    Lt Pressure (mmHg)IndexWaveform Comment +--------+------------------+-----+---------+-------+ Brachial145                    triphasic        +--------+------------------+-----+---------+-------+ PTA     184               1.27 triphasic        +--------+------------------+-----+---------+-------+ DP      174               1.20 triphasic        +--------+------------------+-----+---------+-------+ +-------+---------------+----------------+ ABI/TBIToday's ABI/TBIPrevious ABI/TBI +-------+---------------+----------------+ Right  1.30                            +-------+---------------+----------------+ Left   1.27                            +-------+---------------+----------------+  Right Doppler Findings: +--------+--------+-----+---------+--------+ Site    PressureIndexDoppler  Comments +--------+--------+-----+---------+--------+ Brachial134          triphasic         +--------+--------+-----+---------+--------+ Radial               triphasic         +--------+--------+-----+---------+--------+ Ulnar                triphasic         +--------+--------+-----+---------+--------+  Left   Doppler Findings: +--------+--------+-----+---------+--------+ Site    PressureIndexDoppler  Comments +--------+--------+-----+---------+--------+ Brachial145          triphasic         +--------+--------+-----+---------+--------+ Radial               triphasic          +--------+--------+-----+---------+--------+ Ulnar                triphasic         +--------+--------+-----+---------+--------+  Summary: Right Carotid: Velocities in the right ICA are consistent with a 1-39% stenosis. Left Carotid: Velocities in the left ICA are consistent with a 1-39% stenosis. Vertebrals: Bilateral vertebral arteries demonstrate antegrade flow. Right ABI: Resting right ankle-brachial index is within normal range. No evidence of significant right lower extremity arterial disease. Left ABI: Resting left ankle-brachial index is within normal range. No evidence of significant left lower extremity arterial disease. Right Upper Extremity: Doppler waveform obliterate with right radial compression. Doppler waveform obliterate with right ulnar compression. Left Upper Extremity: Doppler waveform obliterate with left radial compression. Doppler waveforms remain within normal limits with left ulnar compression.  Electronically signed by Christopher Dickson MD on 06/06/2018 at 4:26:23 PM.    Final     Review of Systems  Constitutional: Positive for malaise/fatigue. Negative for chills and fever.  HENT: Positive for hearing loss.   Eyes: Negative for blurred vision and double vision.  Respiratory: Positive for shortness of breath. Negative for cough and wheezing.        Probable sleep apnea  Cardiovascular: Positive for chest pain and leg swelling. Negative for claudication.  Gastrointestinal: Positive for diarrhea and heartburn. Negative for nausea and vomiting.  Genitourinary: Negative for dysuria and frequency.  Neurological: Positive for dizziness and headaches. Negative for speech change, focal weakness and loss of consciousness.  Endo/Heme/Allergies: Bruises/bleeds easily (On Eliquis).  All other systems reviewed and are negative.  Blood pressure 131/66, pulse (!) 56, temperature 97.7 F (36.5 C), temperature source Oral, resp. rate 16, height 6' 1" (1.854 m), weight 105.5 kg, SpO2  98 %. Physical Exam  Vitals reviewed. Constitutional: He is oriented to person, place, and time. He appears well-developed and well-nourished. No distress.  HENT:  Head: Normocephalic and atraumatic.  Mouth/Throat: No oropharyngeal exudate.  Eyes: Pupils are equal, round, and reactive to light. EOM are normal. No scleral icterus.  Neck: Neck supple. No thyromegaly present.  Cardiovascular: Normal rate, regular rhythm, normal heart sounds and intact distal pulses.  No murmur heard. Respiratory: Effort normal and breath sounds normal. No respiratory distress. He has no wheezes. He has no rales.  GI: Soft. He exhibits no distension. There is no abdominal tenderness.  Musculoskeletal:        General: Edema (1+ bilaterally) present.  Lymphadenopathy:    He has no cervical adenopathy.  Neurological: He is alert and oriented to person, place, and time. No cranial nerve deficit. He exhibits normal muscle tone. Coordination normal.  Skin: Skin is warm and dry.  Psychiatric: He has a normal mood and affect.   Cardiac catheterization Conclusion   LM: Normal LAD: Prox 30%, mid 40% focal stenoses LCx: Distal 40% focal stenosis RCA: Focal 90% ISR in prox-mid, mid RCA Distal RCA 70% edge restenosis Diffuse 80% ISR in distal RCA Rest, moderate diffuse ISR Good surgical targets with RPDA and RPLA  Recommendation: Given recurrent ISR with three layers of prior stents, recommend consideration for CABG. Discussed with Dr. Hendrickson Continue Aspirin and heparin. No P2Y12 inhibitor given.    Manish J Patwardhan, MD [facility] Pager: [phone] Office: [phone] If no answer Cell [phone]     Assessment/Plan: Patrick Duran is a 73-year-old gentleman with multiple cardiac risk factors including hypertension, hyperlipidemia, and insulin-dependent type 2 diabetes.  He has known coronary disease with multiple previous percutaneous interventions on the right coronary  artery.  He presents with an unstable coronary syndrome, but ruled out for myocardial infarction.  At catheterization he has in-stent restenosis in the right coronary.  I agree with Dr. Patwardhan that further stenting is inadvisable.  I think he would do far better with coronary artery bypass grafting.  I discussed the possibility of coronary artery bypass grafting with Mr. and Mrs. Mcnellis.  I informed him of the general nature of the procedure, the need for general anesthesia, the use of cardiopulmonary bypass, the incisions to be used, the use of drainage tubes postoperatively, the expected hospital stay, and the overall recovery.  We will plan to use radial artery graft likely a right radial given that he is left-handed.  He was catheterized through that artery so I can assess that formally today but we will reassess that tomorrow.  I informed him of the indications, risks, benefits, and alternatives.  They understand the risk include, but are not limited to death, MI, DVT, PE, bleeding, possible need for transfusion, infection, cardiac arrhythmias, respiratory renal failure, gastrointestinal complications, as well as the possibility of other unforeseeable complications.  He also has a history of paroxysmal atrial fibrillation.  He is on Eliquis.  I discussed the possibility of doing a pulmonary vein isolation and placement of a left atrial clip time of coronary bypass grafting.  I think at a very minimum we should do the atrial clip as that may reduce his risk of stroke should he ever need to come off of anticoagulation for any reason in the future.  I would also favor doing pulmonary vein isolation as he does still have significant palpitations.  There is no guarantee this would eliminate his atrial fibrillation but would add relatively little time and risks of the operation with potentially significant benefit.  He will take that under consideration.  If he decides to proceed with surgery we could do  it on Friday, 06/09/2018  Steven C Hendrickson 06/06/2018, 5:46 PM     

## 2018-06-06 NOTE — H&P (View-Only) (Signed)
Reason for Consult: Single-vessel coronary disease with unstable angina Referring Physician: Dr. Garnet Koyanagi Patrick Duran is an 73 y.o. male.  HPI: Patrick Duran is a 73 year old gentleman who presented with a chief complaint of chest pain.  Patrick Duran is a 73 year old man with a past medical history significant for coronary artery disease, multiple prior percutaneous interventions, hypertension, hyperlipidemia, insulin-dependent type 2 diabetes, ulcers, hypothyroidism, probable sleep apnea, and vertigo.  Patrick Duran has had multiple stents placed in his right coronary over the years.  This dates back about 15 years.  His most recent intervention was in 2016.  Patrick Duran did well after that until 2 days ago when Patrick Duran developed sudden onset of substernal chest pain associated with nausea.  Patrick Duran also felt short of breath.  Patrick Duran took 4 aspirin at home but that did not help.  Patrick Duran did not have any nitroglycerin.  Patrick Duran came to the emergency room and says that the pain went away with "what ever they gave me."  Patrick Duran has not had any recurrent chest pain since admission.  Yesterday Patrick Duran had cardiac catheterization which revealed in-stent restenosis in a heavily stented right coronary artery.  There is no hemodynamically significant disease in the left coronary distribution.  Echocardiogram showed ejection fraction of 55 to 60% with no significant valvular pathology.  Past Medical History:  Diagnosis Date  . Arthritis    "fingers, right hip" (11/07/2014)  . Complication of anesthesia    "trembling during my 1st cath"  . Coronary artery disease   . Easy bruising   . GERD (gastroesophageal reflux disease)   . Headache    "monthly" (11/07/2014)  . 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 History of stomach ulcers   . Hyperlipidemia   . Hypertension   . Hypothyroidism   . Myocardial infarction (HCC) 03/2014  . PONV (postoperative nausea and vomiting)   . Sleep  apnea    "probably; didn't want to do the test" (11/07/2014)  . Trouble swallowing   . Type II diabetes mellitus (HCC)   . Vertigo     Past Surgical History:  Procedure Laterality Date  . ANTERIOR CERVICAL DECOMP/DISCECTOMY FUSION  2004  . CARDIAC CATHETERIZATION  2000  . CARDIAC CATHETERIZATION  04/15/2014   Procedure: CORONARY STENT INTERVENTION;  Surgeon: Pamella Pert, MD;  Location: [facility] CATH LAB;  Service: Cardiovascular;;  RCA  . CARDIAC CATHETERIZATION N/A 11/07/2014   Procedure: Left Heart Cath and Coronary Angiography;  Surgeon: Yates Decamp, MD;  Location: [facility] INVASIVE CV LAB;  Service: Cardiovascular;  Laterality: N/A;  . CORONARY ANGIOGRAPHY N/A 06/05/2018   Procedure: CORONARY ANGIOGRAPHY;  Surgeon: Elder Negus, MD;  Location: [facility] INVASIVE CV LAB;  Service: Cardiovascular;  Laterality: N/A;  . CORONARY ANGIOPLASTY WITH STENT PLACEMENT  2000; 11/07/2014   "3 stents; 1 stent"  . LAPAROSCOPIC CHOLECYSTECTOMY  1998  . LEFT HEART CATHETERIZATION WITH CORONARY ANGIOGRAM N/A 04/15/2014   Procedure: LEFT HEART CATHETERIZATION WITH CORONARY ANGIOGRAM;  Surgeon: Pamella Pert, MD;  Location: [facility] CATH LAB;  Service: Cardiovascular;  Laterality: N/A;    Family History  Problem Relation Age of Onset  . Diabetes Mother   . Hypothyroidism Mother   . Stroke Father   . Hypertension Father   . Colon cancer Neg Hx   . Stomach cancer Neg Hx   . Rectal cancer Neg Hx   . Esophageal cancer Neg Hx   .  Liver cancer Neg Hx     Social History:  reports that Patrick Duran has quit smoking. His smoking use included cigarettes. Patrick Duran has a 51.00 pack-year smoking history. Patrick Duran has never used smokeless tobacco. Patrick Duran reports that Patrick Duran does not drink alcohol or use drugs.  Allergies:  Allergies  Allergen Reactions  . Codeine Nausea Only  . Codeine Sulfate     REACTION: nausea  . Eggs Or Egg-Derived Products   . Hydrocodone Nausea Only    nausea  . Oxycodone Nausea Only  . Peanut-Containing Drug Products      And all nuts  . Percocet [Oxycodone-Acetaminophen]     nausea    Medications:  Scheduled: . aspirin  324 mg Oral Once  . aspirin EC  81 mg Oral Daily  . atorvastatin  40 mg Oral Daily  . fenofibrate  160 mg Oral Daily  . hydrALAZINE  25 mg Oral Daily  . insulin aspart  0-5 Units Subcutaneous QHS  . insulin aspart  0-9 Units Subcutaneous TID WC  . insulin glargine  10 Units Subcutaneous Daily  . labetalol  200 mg Oral BID  . levothyroxine  125 mcg Oral QAC breakfast  . pantoprazole  40 mg Oral Daily  . sodium chloride flush  3 mL Intravenous Once  . sodium chloride flush  3 mL Intravenous Q12H    Results for orders placed or performed during the hospital encounter of 06/04/18 (from the past 48 hour(s))  CBG monitoring, ED     Status: Abnormal   Collection Time: 06/04/18  5:56 PM  Result Value Ref Range   Glucose-Capillary 109 (H) 70 - 99 mg/dL  Troponin I - Now Then Q3H     Status: None   Collection Time: 06/04/18  7:56 PM  Result Value Ref Range   Troponin I <0.03 <0.03 ng/mL    Comment: Performed at [facility] Lab, [address] [place], [place] [address]  Glucose, capillary     Status: Abnormal   Collection Time: 06/04/18  9:14 PM  Result Value Ref Range   Glucose-Capillary 127 (H) 70 - 99 mg/dL  Troponin I - Now Then Q3H     Status: None   Collection Time: 06/04/18 11:11 PM  Result Value Ref Range   Troponin I <0.03 <0.03 ng/mL    Comment: Performed at [facility] Lab, [address] [place], [place] [address]  APTT     Status: Abnormal   Collection Time: 06/05/18  2:09 AM  Result Value Ref Range   aPTT 53 (H) 24 - 36 seconds    Comment:        IF BASELINE aPTT IS ELEVATED, SUGGEST PATIENT RISK ASSESSMENT BE USED TO DETERMINE APPROPRIATE ANTICOAGULANT THERAPY. Performed at [facility] Lab, [address], [place], [place] [address]   Heparin level (unfractionated)     Status: Abnormal   Collection Time: 06/05/18  2:09 AM  Result Value Ref  Range   Heparin Unfractionated 0.94 (H) 0.30 - 0.70 IU/mL    Comment: (NOTE) If heparin results are below expected values, and patient dosage has  been confirmed, suggest follow up testing of antithrombin III levels. Performed at [facility] Lab, [address], [place], [place] [address]   Troponin I - Now Then Q3H     Status: None   Collection Time: 06/05/18  2:09 AM  Result Value Ref Range   Troponin I <0.03 <0.03 ng/mL    Comment: Performed at [facility]  Lab, [address] [place], [place] [address]  Basic metabolic panel     Status: Abnormal   Collection Time: 06/05/18  4:41 AM  Result Value Ref Range   Sodium 141 135 - 145 mmol/L   Potassium 3.8 3.5 - 5.1 mmol/L   Chloride 112 (H) 98 - 111 mmol/L   CO2 21 (L) 22 - 32 mmol/L   Glucose, Bld 114 (H) 70 - 99 mg/dL   BUN 17 8 - 23 mg/dL   Creatinine, Ser 5.36 (H) 0.61 - 1.24 mg/dL   Calcium 9.0 8.9 - 14.4 mg/dL   GFR calc non Af Amer 52 (L) >60 mL/min   GFR calc Af Amer >60 >60 mL/min   Anion gap 8 5 - 15    Comment: Performed at [facility] Lab, [address], [place], [place] [address]  Glucose, capillary     Status: None   Collection Time: 06/05/18  6:25 AM  Result Value Ref Range   Glucose-Capillary 74 70 - 99 mg/dL  CBC     Status: Abnormal   Collection Time: 06/05/18  9:27 AM  Result Value Ref Range   WBC 5.1 4.0 - 10.5 K/uL   RBC 3.87 (L) 4.22 - 5.81 MIL/uL   Hemoglobin 12.0 (L) 13.0 - 17.0 g/dL   HCT 08.6 (L) 76.1 - 95.0 %   MCV 93.3 80.0 - 100.0 fL   MCH 31.0 26.0 - 34.0 pg   MCHC 33.2 30.0 - 36.0 g/dL   RDW 93.2 67.1 - 24.5 %   Platelets 211 150 - 400 K/uL   nRBC 0.0 0.0 - 0.2 %    Comment: Performed at [facility] Lab, [address], [place], [place] [address]  Troponin I - Now Then Q6H     Status: None   Collection Time: 06/05/18  9:27 AM  Result Value Ref Range   Troponin I <0.03 <0.03 ng/mL    Comment: Performed at [facility] Lab, [address] [place], [place] [address]   Glucose, capillary     Status: Abnormal   Collection Time: 06/05/18 12:17 PM  Result Value Ref Range   Glucose-Capillary 182 (H) 70 - 99 mg/dL  Troponin I - Now Then Q6H     Status: None   Collection Time: 06/05/18  2:33 PM  Result Value Ref Range   Troponin I <0.03 <0.03 ng/mL    Comment: Performed at [facility] Lab, [address], [place], [place] [address]  TSH     Status: None   Collection Time: 06/05/18  2:33 PM  Result Value Ref Range   TSH 1.426 0.350 - 4.500 uIU/mL    Comment: Performed by a 3rd Generation assay with a functional sensitivity of <=0.01 uIU/mL. Performed at [facility] Lab, [address], [place], [place] [address]   Troponin I - Now Then Q6H     Status: None   Collection Time: 06/05/18  8:52 PM  Result Value Ref Range   Troponin I <0.03 <0.03 ng/mL    Comment: Performed at [facility] Lab, [address], [place], [place] [address]  Glucose, capillary     Status: Abnormal   Collection Time: 06/05/18  9:03 PM  Result Value Ref Range   Glucose-Capillary 112 (H) 70 - 99 mg/dL  Glucose, capillary     Status: Abnormal   Collection Time: 06/06/18 12:12 AM  Result Value Ref Range   Glucose-Capillary 148 (H) 70 - 99 mg/dL  Glucose, capillary     Status: Abnormal   Collection Time: 06/06/18  4:34 AM  Result Value Ref Range   Glucose-Capillary 132 (H) 70 - 99 mg/dL  Heparin level (unfractionated)     Status: Abnormal   Collection Time: 06/06/18  5:15 AM  Result Value Ref Range   Heparin Unfractionated <0.10 (L) 0.30 - 0.70 IU/mL    Comment: (NOTE) If heparin results are below expected values, and patient dosage has  been confirmed, suggest follow up testing of antithrombin III levels. Performed at [facility] Lab, [address], [place], [place] [address]   CBC     Status: Abnormal   Collection Time: 06/06/18  5:15 AM  Result Value Ref Range   WBC 4.4 4.0 - 10.5 K/uL   RBC 3.62 (L) 4.22 - 5.81 MIL/uL   Hemoglobin 10.9 (L) 13.0 - 17.0  g/dL   HCT 19.1 (L) 47.8 - 29.5 %   MCV 93.6 80.0 - 100.0 fL   MCH 30.1 26.0 - 34.0 pg   MCHC 32.2 30.0 - 36.0 g/dL   RDW 62.1 30.8 - 65.7 %   Platelets 184 150 - 400 K/uL   nRBC 0.0 0.0 - 0.2 %    Comment: Performed at [facility] Lab, [address] [place], [place] [address]  Basic metabolic panel     Status: Abnormal   Collection Time: 06/06/18  5:15 AM  Result Value Ref Range   Sodium 138 135 - 145 mmol/L   Potassium 3.7 3.5 - 5.1 mmol/L   Chloride 109 98 - 111 mmol/L   CO2 21 (L) 22 - 32 mmol/L   Glucose, Bld 135 (H) 70 - 99 mg/dL   BUN 12 8 - 23 mg/dL   Creatinine, Ser 2.95 (H) 0.61 - 1.24 mg/dL   Calcium 8.5 (L) 8.9 - 10.3 mg/dL   GFR calc non Af Amer 52 (L) >60 mL/min   GFR calc Af Amer >60 >60 mL/min   Anion gap 8 5 - 15    Comment: Performed at [facility] Lab, [address] [place], [place] [address]  Lipid panel     Status: Abnormal   Collection Time: 06/06/18  5:15 AM  Result Value Ref Range   Cholesterol 112 0 - 200 mg/dL   Triglycerides 244 <010 mg/dL   HDL 23 (L) >27 mg/dL   Total CHOL/HDL Ratio 4.9 RATIO   VLDL 28 0 - 40 mg/dL   LDL Cholesterol 61 0 - 99 mg/dL    Comment:        Total Cholesterol/HDL:CHD Risk Coronary Heart Disease Risk Table                     Men   Women  1/2 Average Risk   3.4   3.3  Average Risk       5.0   4.4  2 X Average Risk   9.6   7.1  3 X Average Risk  23.4   11.0        Use the calculated Patient Ratio above and the CHD Risk Table to determine the patient's CHD Risk.        ATP III CLASSIFICATION (LDL):  <100     mg/dL   Optimal  253-664  mg/dL   Near or Above                    Optimal  130-159  mg/dL   Borderline  160-189  mg/dL   High  >161     mg/dL   Very High Performed at [facility] Lab, [address], [place], [place] [address]   APTT     Status: None   Collection Time: 06/06/18  5:15 AM  Result Value Ref Range   aPTT 30 24 - 36 seconds    Comment: Performed at [facility] Lab, [address] [place], [place] [address]  Hemoglobin A1c     Status: Abnormal   Collection Time: 06/06/18  5:15 AM  Result Value Ref Range   Hgb A1c MFr Bld 5.8 (H) 4.8 - 5.6 %    Comment: (NOTE) Pre diabetes:          5.7%-6.4% Diabetes:              >6.4% Glycemic control for   <7.0% adults with diabetes    Mean Plasma Glucose 119.76 mg/dL    Comment: Performed at [facility], [address], [place], [place] [address]  Glucose, capillary     Status: Abnormal   Collection Time: 06/06/18 12:06 PM  Result Value Ref Range   Glucose-Capillary 173 (H) 70 - 99 mg/dL  Glucose, capillary     Status: Abnormal   Collection Time: 06/06/18  4:39 PM  Result Value Ref Range   Glucose-Capillary 110 (H) 70 - 99 mg/dL    Vas US Doppler Pre Cabg  Result Date: 06/06/2018 PREOPERATIVE VASCULAR EVALUATION  Indications:  Pre cabg. Risk Factors: Hypertension, hyperlipidemia, Diabetes. Performing Technologist: Blanch Media RVS  Examination Guidelines: A complete evaluation includes B-mode imaging, spectral Doppler, color Doppler, and power Doppler as needed of all accessible portions of each vessel. Bilateral testing is considered an integral part of a complete examination. Limited examinations for reoccurring indications may be performed as noted.  Right Carotid Findings: +----------+--------+--------+--------+-----------+--------+           PSV cm/sEDV cm/sStenosisDescribe   Comments +----------+--------+--------+--------+-----------+--------+ CCA Prox  76      16              homogeneous         +----------+--------+--------+--------+-----------+--------+ CCA Distal76      16              homogeneous         +----------+--------+--------+--------+-----------+--------+ ICA Prox  117     31      1-39%   homogeneous         +----------+--------+--------+--------+-----------+--------+ ICA Distal90      28                                   +----------+--------+--------+--------+-----------+--------+ ECA       175                                         +----------+--------+--------+--------+-----------+--------+ Portions of this table do not appear on this page. +----------+--------+-------+--------+------------+           PSV cm/sEDV cmsDescribeArm Pressure +----------+--------+-------+--------+------------+ [id number]                    134          +----------+--------+-------+--------+------------+ +---------+--------+--+--------+-+---------+ VertebralPSV cm/s51EDV cm/s8Antegrade +---------+--------+--+--------+-+---------+ Left Carotid Findings: +----------+--------+--------+--------+-----------+--------+           PSV cm/sEDV cm/sStenosisDescribe  Comments +----------+--------+--------+--------+-----------+--------+ CCA Prox  114     19              homogeneous         +----------+--------+--------+--------+-----------+--------+ CCA Distal88      15              homogeneous         +----------+--------+--------+--------+-----------+--------+ ICA Prox  73      18      1-39%   homogeneous         +----------+--------+--------+--------+-----------+--------+ ICA Distal61      15                                  +----------+--------+--------+--------+-----------+--------+ ECA       79                                          +----------+--------+--------+--------+-----------+--------+ +----------+--------+--------+--------+------------+ SubclavianPSV cm/sEDV cm/sDescribeArm Pressure +----------+--------+--------+--------+------------+           98                      145          +----------+--------+--------+--------+------------+ +---------+--------+--+--------+--+---------+ VertebralPSV cm/s36EDV cm/s10Antegrade +---------+--------+--+--------+--+---------+  ABI Findings: +--------+------------------+-----+---------+--------+ Right   Rt Pressure  (mmHg)IndexWaveform Comment  +--------+------------------+-----+---------+--------+ [id number]                    triphasic         +--------+------------------+-----+---------+--------+ PTA     188               1.30 triphasic         +--------+------------------+-----+---------+--------+ DP      172               1.19 triphasic         +--------+------------------+-----+---------+--------+ +--------+------------------+-----+---------+-------+ Left    Lt Pressure (mmHg)IndexWaveform Comment +--------+------------------+-----+---------+-------+ [id number]                    triphasic        +--------+------------------+-----+---------+-------+ PTA     184               1.27 triphasic        +--------+------------------+-----+---------+-------+ DP      174               1.20 triphasic        +--------+------------------+-----+---------+-------+ +-------+---------------+----------------+ ABI/TBIToday's ABI/TBIPrevious ABI/TBI +-------+---------------+----------------+ Right  1.30                            +-------+---------------+----------------+ Left   1.27                            +-------+---------------+----------------+  Right Doppler Findings: +--------+--------+-----+---------+--------+ Site    PressureIndexDoppler  Comments +--------+--------+-----+---------+--------+ [id number]          triphasic         +--------+--------+-----+---------+--------+ Radial               triphasic         +--------+--------+-----+---------+--------+ Ulnar                triphasic         +--------+--------+-----+---------+--------+  Left  Doppler Findings: +--------+--------+-----+---------+--------+ Site    PressureIndexDoppler  Comments +--------+--------+-----+---------+--------+ [id number]          triphasic         +--------+--------+-----+---------+--------+ Radial               triphasic          +--------+--------+-----+---------+--------+ Ulnar                triphasic         +--------+--------+-----+---------+--------+  Summary: Right Carotid: Velocities in the right ICA are consistent with a 1-39% stenosis. Left Carotid: Velocities in the left ICA are consistent with a 1-39% stenosis. Vertebrals: Bilateral vertebral arteries demonstrate antegrade flow. Right ABI: Resting right ankle-brachial index is within normal range. No evidence of significant right lower extremity arterial disease. Left ABI: Resting left ankle-brachial index is within normal range. No evidence of significant left lower extremity arterial disease. Right Upper Extremity: Doppler waveform obliterate with right radial compression. Doppler waveform obliterate with right ulnar compression. Left Upper Extremity: Doppler waveform obliterate with left radial compression. Doppler waveforms remain within normal limits with left ulnar compression.  Electronically signed by Waverly Ferrari MD on 06/06/2018 at 4:26:23 PM.    Final     Review of Systems  Constitutional: Positive for malaise/fatigue. Negative for chills and fever.  HENT: Positive for hearing loss.   Eyes: Negative for blurred vision and double vision.  Respiratory: Positive for shortness of breath. Negative for cough and wheezing.        Probable sleep apnea  Cardiovascular: Positive for chest pain and leg swelling. Negative for claudication.  Gastrointestinal: Positive for diarrhea and heartburn. Negative for nausea and vomiting.  Genitourinary: Negative for dysuria and frequency.  Neurological: Positive for dizziness and headaches. Negative for speech change, focal weakness and loss of consciousness.  Endo/Heme/Allergies: Bruises/bleeds easily (On Eliquis).  All other systems reviewed and are negative.  Blood pressure 131/66, pulse (!) 56, temperature 97.7 F (36.5 C), temperature source Oral, resp. rate 16, height 6\' 1"  (1.854 m), weight 105.5 kg, SpO2  98 %. Physical Exam  Vitals reviewed. Constitutional: Patrick Duran is oriented to person, place, and time. Patrick Duran appears well-developed and well-nourished. No distress.  HENT:  Head: Normocephalic and atraumatic.  Mouth/Throat: No oropharyngeal exudate.  Eyes: Pupils are equal, round, and reactive to light. EOM are normal. No scleral icterus.  Neck: Neck supple. No thyromegaly present.  Cardiovascular: Normal rate, regular rhythm, normal heart sounds and intact distal pulses.  No murmur heard. Respiratory: Effort normal and breath sounds normal. No respiratory distress. Patrick Duran has no wheezes. Patrick Duran has no rales.  GI: Soft. Patrick Duran exhibits no distension. There is no abdominal tenderness.  Musculoskeletal:        General: Edema (1+ bilaterally) present.  Lymphadenopathy:    Patrick Duran has no cervical adenopathy.  Neurological: Patrick Duran is alert and oriented to person, place, and time. No cranial nerve deficit. Patrick Duran exhibits normal muscle tone. Coordination normal.  Skin: Skin is warm and dry.  Psychiatric: Patrick Duran has a normal mood and affect.   Cardiac catheterization Conclusion   LM: Normal LAD: Prox 30%, mid 40% focal stenoses LCx: Distal 40% focal stenosis RCA: Focal 90% ISR in prox-mid, mid RCA Distal RCA 70% edge restenosis Diffuse 80% ISR in distal RCA Rest, moderate diffuse ISR Good surgical targets with RPDA and RPLA  Recommendation: Given recurrent ISR with three layers of prior stents, recommend consideration for CABG. Discussed with Dr. Dorris Fetch Continue Aspirin and heparin. No P2Y12 inhibitor given.  Elder NegusManish J Patwardhan, MD [facility] Pager: [phone] Office: [phone] If no answer Cell [phone]     Assessment/Plan: Annalee GentaRobert Bomberger is a 73 year old gentleman with multiple cardiac risk factors including hypertension, hyperlipidemia, and insulin-dependent type 2 diabetes.  Patrick Duran has known coronary disease with multiple previous percutaneous interventions on the right coronary  artery.  Patrick Duran presents with an unstable coronary syndrome, but ruled out for myocardial infarction.  At catheterization Patrick Duran has in-stent restenosis in the right coronary.  I agree with Dr. Rosemary HolmsPatwardhan that further stenting is inadvisable.  I think Patrick Duran would do far better with coronary artery bypass grafting.  I discussed the possibility of coronary artery bypass grafting with Patrick Duran.  I informed him of the general nature of the procedure, the need for general anesthesia, the use of cardiopulmonary bypass, the incisions to be used, the use of drainage tubes postoperatively, the expected hospital stay, and the overall recovery.  We will plan to use radial artery graft likely a right radial given that Patrick Duran is left-handed.  Patrick Duran was catheterized through that artery so I can assess that formally today but we will reassess that tomorrow.  I informed him of the indications, risks, benefits, and alternatives.  They understand the risk include, but are not limited to death, MI, DVT, PE, bleeding, possible need for transfusion, infection, cardiac arrhythmias, respiratory renal failure, gastrointestinal complications, as well as the possibility of other unforeseeable complications.  Patrick Duran also has a history of paroxysmal atrial fibrillation.  Patrick Duran is on Eliquis.  I discussed the possibility of doing a pulmonary vein isolation and placement of a left atrial clip time of coronary bypass grafting.  I think at a very minimum we should do the atrial clip as that may reduce his risk of stroke should Patrick Duran ever need to come off of anticoagulation for any reason in the future.  I would also favor doing pulmonary vein isolation as Patrick Duran does still have significant palpitations.  There is no guarantee this would eliminate his atrial fibrillation but would add relatively little time and risks of the operation with potentially significant benefit.  Patrick Duran will take that under consideration.  If Patrick Duran decides to proceed with surgery we could do  it on Friday, 06/09/2018  Loreli SlotSteven C Solita Macadam 06/06/2018, 5:46 PM

## 2018-06-06 NOTE — Progress Notes (Signed)
ANTICOAGULATION CONSULT NOTE  Pharmacy Consult for heparin Indication: chest pain/ACS  Allergies  Allergen Reactions  . Codeine Nausea Only  . Codeine Sulfate     REACTION: nausea  . Eggs Or Egg-Derived Products   . Hydrocodone Nausea Only    nausea  . Oxycodone Nausea Only  . Peanut-Containing Drug Products     And all nuts  . Percocet [Oxycodone-Acetaminophen]     nausea    Patient Measurements: Height: 6\' 1"  (185.4 cm) Weight: 232 lb 8 oz (105.5 kg) IBW/kg (Calculated) : 79.9 Heparin Dosing Weight: 101.2 kg  Vital Signs: Temp: 97.7 F (36.5 C) (03/10 0941) Temp Source: Oral (03/10 0941) BP: 131/66 (03/10 0941) Pulse Rate: 56 (03/10 0941)  Labs: Recent Labs    06/04/18 1329  06/05/18 0209 06/05/18 0441 06/05/18 0927 06/05/18 1433 06/05/18 2052 06/06/18 0515 06/06/18 1906  HGB 12.8*  --   --   --  12.0*  --   --  10.9*  --   HCT 39.5  --   --   --  36.1*  --   --  33.9*  --   PLT 243  --   --   --  211  --   --  184  --   APTT  --   --  53*  --   --   --   --  30 93*  LABPROT 14.7  --   --   --   --   --   --   --   --   INR 1.2  --   --   --   --   --   --   --   --   HEPARINUNFRC  --   --  0.94*  --   --   --   --  <0.10* 0.56  CREATININE 1.36*  --   --  1.36*  --   --   --  1.35*  --   TROPONINI  --    < > <0.03  --  <0.03 <0.03 <0.03  --   --    < > = values in this interval not displayed.    Estimated Creatinine Clearance: 64 mL/min (A) (by C-G formula based on SCr of 1.35 mg/dL (H)).   Medical History: Past Medical History:  Diagnosis Date  . Arthritis    "fingers, right hip" (11/07/2014)  . Complication of anesthesia    "trembling during my 1st cath"  . Coronary artery disease   . Easy bruising   . GERD (gastroesophageal reflux disease)   . Headache    "monthly" (11/07/2014)  . 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 History of stomach ulcers   . Hyperlipidemia   .  Hypertension   . Hypothyroidism   . Myocardial infarction (HCC) 03/2014  . PONV (postoperative nausea and vomiting)   . Sleep apnea    "probably; didn't want to do the test" (11/07/2014)  . Trouble swallowing   . Type II diabetes mellitus (HCC)   . Vertigo     Medications:  Scheduled:  . aspirin  324 mg Oral Once  . aspirin EC  81 mg Oral Daily  . atorvastatin  40 mg Oral Daily  . fenofibrate  160 mg Oral Daily  . hydrALAZINE  25 mg Oral Daily  . insulin aspart  0-5 Units Subcutaneous QHS  . insulin aspart  0-9 Units Subcutaneous TID WC  . insulin glargine  10 Units Subcutaneous Daily  . labetalol  200 mg Oral BID  . levothyroxine  125 mcg Oral QAC breakfast  . pantoprazole  40 mg Oral Daily  . sodium chloride flush  3 mL Intravenous Once  . sodium chloride flush  3 mL Intravenous Q12H    Assessment: 72 yom presenting with chest pain and diaphoresis. Has extensive hx of CAD w/ 3 prior stents. On apixaban PTA for hx Afib- LD 3/8@0700 .   Heparin level tonight therapeutic at 0.56 and aPTT therapeutic at 93- correlating so will monitor with heparin levels. No s/sx of bleeding. No infusion issues per nursing.    Goal of Therapy:  Heparin level 0.3-0.7 units/ml aPTT 66-102 seconds Monitor platelets by anticoagulation protocol: Yes   Plan:  Continue heparin infusion at 1600 units/hr Check anti-Xa level daily while on heparin Continue to monitor H&H and platelets  Sherron Monday, PharmD, BCCCP Clinical Pharmacist  Pager: [phone] Phone: [phone] 06/06/2018,8:16 PM

## 2018-06-06 NOTE — Progress Notes (Signed)
Pre Cabg has been completed.   Preliminary results in CV Proc.   Blanch Media 06/06/2018 2:13 PM

## 2018-06-06 NOTE — Progress Notes (Signed)
Subjective:  No chest pain this morning.   Objective:  Vital Signs in the last 24 hours: Temp:  [97.6 F (36.4 C)-98 F (36.7 C)] 97.7 F (36.5 C) (03/10 0941) Pulse Rate:  [42-58] 56 (03/10 0941) Resp:  [8-27] 16 (03/10 0433) BP: (104-146)/(45-70) 131/66 (03/10 0941) SpO2:  [97 %-100 %] 98 % (03/10 0941) Weight:  [105.5 kg] 105.5 kg (03/10 0435)  Intake/Output from previous day: 03/09 0701 - 03/10 0700 In: 480 [P.O.:480] Out: 650 [Urine:650]  Physical Exam Constitutional: He is oriented to person, place, and time. He appears well-developed and well-nourished. No distress.  HENT:  Head: Normocephalic and atraumatic.  Eyes: Pupils are equal, round, and reactive to light. Conjunctivae are normal.  Neck: No JVD present.  Cardiovascular: Normal rate, regular rhythm and intact distal pulses.  No murmur heard. Pulmonary/Chest: Effort normal and breath sounds normal. He has no wheezes. He has no rales.  Abdominal: Soft. Bowel sounds are normal. There is no rebound.  Musculoskeletal:        General: Edema (1+ b/l.  Discoloration bilateral legs medial aspect.  Erythema right leg medial aspect.) present.  Lymphadenopathy:    He has no cervical adenopathy.  Neurological: He is alert and oriented to person, place, and time. No cranial nerve deficit.  Skin: Skin is warm and dry.  Psychiatric: He has a normal mood and affect.  Nursing note and vitals reviewed.   Lab Results: BMP Recent Labs    06/04/18 1329 06/05/18 0441 06/06/18 0515  NA 138 141 138  K 4.1 3.8 3.7  CL 111 112* 109  CO2 19* 21* 21*  GLUCOSE 96 114* 135*  BUN 18 17 12   CREATININE 1.36* 1.36* 1.35*  CALCIUM 9.4 9.0 8.5*  GFRNONAA 52* 52* 52*  GFRAA >60 >60 >60    CBC Recent Labs  Lab 06/06/18 0515  WBC 4.4  RBC 3.62*  HGB 10.9*  HCT 33.9*  PLT 184  MCV 93.6  MCH 30.1  MCHC 32.2  RDW 12.8    HEMOGLOBIN A1C Lab Results  Component Value Date   HGBA1C 5.8 (H) 06/06/2018   MPG 119.76  06/06/2018    Cardiac Panel (last 3 results) Recent Labs    06/05/18 0927 06/05/18 1433 06/05/18 2052  TROPONINI <0.03 <0.03 <0.03    BNP (last 3 results) Recent Labs    06/04/18 1329  BNP 125.1*    TSH Recent Labs    06/05/18 1433  TSH 1.426    Lipid Panel     Component Value Date/Time   CHOL 112 06/06/2018 0515   TRIG 139 06/06/2018 0515   HDL 23 (L) 06/06/2018 0515   CHOLHDL 4.9 06/06/2018 0515   VLDL 28 06/06/2018 0515   LDLCALC 61 06/06/2018 0515   LDLDIRECT 106.6 10/12/2007 0906     CARDIAC STUDIES:  Coronary angiography 06/05/2018: LM: Normal LAD: Prox 30%, mid 40% focal stenoses LCx: Distal 40% focal stenosis RCA: Focal 90% ISR in prox-mid, mid RCA Distal RCA 70% edge restenosis Diffuse 80% ISR in distal RCA Rest, moderate diffuse ISR Good surgical targets with RPDA and RPLA  Recommendation: Given recurrent ISR with three layers of prior stents, recommend consideration for CABG.    EKG 06/04/2018: Sinus bradycardia 50 bpm. Inferior T wave inversions are new. Consider ischemia  Echocardiogram 06/05/2018: 1. The left ventricle has normal systolic function, with an ejection fraction of 55-60%. The cavity size was normal. There is mild concentric left ventricular hypertrophy. Left ventricular diastolic Doppler parameters are consistent  with pseudonormalization. Mild basal inferior hypokinesis.   2. The right ventricle has normal systolic function. The cavity was normal. There is no increase in right ventricular wall thickness.  3. Left atrial size was moderately dilated.  4. Right atrial size was mildly dilated.    Assessment & Recommendations:  73 y/o Caucasian male  with CAD, multiple prior PCI's with recurrent RCA ISR, normal stress test 2017, paroxysmal atrial fibrillation, hypertension, type 2 DM, vertigo, admitted with  Chest pain: Unstable angina. Coronary angiography shows multiple areas of moderate to severe ISR in prox to  distal RCA. Given three layers of stents with recurrent ISR, best option is CABG. He has good targets in RPDA and RPLA. Discussed with Dr. Dorris Fetch. Appreciate his help. Tentative plan for surgery later this week.   PAF: CHA2DS2VASc score 4, annual stroke risk 5% Currently in sinus rhythm.  Hold Eliquis.  Continue heparin.  Venous insufficiency: No ulcer, but has erythema RLL. Consider wound consult. He may need consideration for endovenous ablation in the future.    Elder Negus, M.D. 06/06/2018, 11:34 AM [facility] Pager: [phone] Office: [phone] If no answer: [phone]

## 2018-06-06 NOTE — Progress Notes (Signed)
Pt refusing bed alarm.

## 2018-06-06 NOTE — Progress Notes (Signed)
Paged Dr. Okey Dupre, cardiologist on call to clarify if patient needs to be restarted on heparin drip, awaiting call back.

## 2018-06-06 NOTE — Progress Notes (Signed)
[facility] PROGRESS NOTE  [name]:[id number] DOB: 1945-10-06 DOA: 06/04/2018 PCP: Ardith Dark, MD  Assessment/Plan:  Chest pain. Pain free this am. Troponin negative x3. ekg with inferior T wave inversion, chest xray without cardiopulmonary process. Evaluated by cardiology who opined non-STEMI/unstable angina. Coronary angiography 06/05/18 reveals multiple areas fo moderate to severe ISR in prox to distal RCA. Recommending CABG. CABG in 2 days -Continue statin. -Continue beta-blocker with parameters -hold lisinopril and metformin -continue heparin, aspirin  Hypertension associated with diabetes (HCC) fair control -continue BB -monitor closely  Type 2 diabetes mellitus without complication, with long-term current use of insulin (HCC) home meds include lantus, glipizide, metformin -holding home lantus for now and providing at lower dose -sensitive SSI for optimal control -hold metformin and glipizide.   CKD (chronic kidney disease), stage III (HCC) - baseline Scr 1.3 creatinine 1.35 this am -hold nephrotoxins -gentle IV fluids -monitor urine output -recheck in am  Hypothyroidism. TSH 1.42 Stable.  Dyslipidemia associated with type 2 diabetes mellitus (HCC) Continue statin.  CAD s/p stenting x3 multiple prior PCI's with recurrent RCA ISR, normal stress test 2017. See #1. -Continue aspirin, statin, beta-blocker. .  Deafness in left ear Chronic.  GERD (gastroesophageal reflux disease) Continue Protonix.  Atrial fibrillation (HCC) Currently in sinus bradycardia. Home meds include BB and eliquis -holding eliquis -continue BB with parameters -monitor  Hypomagnesemia.  -repleted  Nausea/vomiting.  -scheduled zofran  Code Status: full Family Communication: wife at bedside Disposition Plan: home after surgery   Consultants:  [facility]  [facility]  Procedures:  Coronary angiography  06/05/18  Antibiotics:    HPI/Subjective: Patrick Duran is a 73 y.o. male with a Past Medical History of CAD s/p multiple stents, essential hypertension, diabetes mellitus type 2, hypothyroidism, and chronic kidney disease stage III; who presented with complaints of chest pain concerning for unstable angina.  Troponins  remained negative. Cardiology recommending CABG s/p angiography yesterday.   Patient remains chest pain-free. CABG tentatively for 06/09/18.  Objective: Vitals:   06/06/18 0433 06/06/18 0941  BP: (!) 112/48 131/66  Pulse: (!) 58 (!) 56  Resp: 16   Temp: 97.8 F (36.6 C) 97.7 F (36.5 C)  SpO2: 97% 98%    Intake/Output Summary (Last 24 hours) at 06/06/2018 1158 Last data filed at 06/05/2018 2141 Gross per 24 hour  Intake 240 ml  Output 650 ml  Net -410 ml   Filed Weights   06/04/18 1850 06/05/18 0512 06/06/18 0435  Weight: 104.6 kg 104.4 kg 105.5 kg    Exam:   General:  Sitting on side of bed in no acute distress  Cardiovascular: rrr no mgr 1+ LE edema with erythema  Respiratory: normal effort BS clear bilaterally no wheeze  Abdomen: obese soft +BS no guarding or rebounding  Musculoskeletal: joints without swelling/erythema   Data Reviewed: Basic Metabolic Panel: Recent Labs  Lab 06/04/18 1329 06/04/18 1619 06/05/18 0441 06/06/18 0515  NA 138  --  141 138  K 4.1  --  3.8 3.7  CL 111  --  112* 109  CO2 19*  --  21* 21*  GLUCOSE 96  --  114* 135*  BUN 18  --  17 12  CREATININE 1.36*  --  1.36* 1.35*  CALCIUM 9.4  --  9.0 8.5*  MG  --  1.6*  --   --    Liver Function Tests: No results for input(s): AST, ALT, ALKPHOS, BILITOT, PROT, ALBUMIN in the last 168 hours.  No results for input(s): LIPASE, AMYLASE in the last 168 hours. No results for input(s): AMMONIA in the last 168 hours. CBC: Recent Labs  Lab 06/04/18 1329 06/05/18 0927 06/06/18 0515  WBC 5.3 5.1 4.4  HGB 12.8* 12.0* 10.9*  HCT 39.5 36.1* 33.9*  MCV 93.6 93.3 93.6  PLT  243 211 184   Cardiac Enzymes: Recent Labs  Lab 06/04/18 2311 06/05/18 0209 06/05/18 0927 06/05/18 1433 06/05/18 2052  TROPONINI <0.03 <0.03 <0.03 <0.03 <0.03   BNP (last 3 results) Recent Labs    06/04/18 1329  BNP 125.1*    ProBNP (last 3 results) No results for input(s): PROBNP in the last 8760 hours.  CBG: Recent Labs  Lab 06/05/18 0625 06/05/18 1217 06/05/18 2103 06/06/18 0012 06/06/18 0434  GLUCAP 74 182* 112* 148* 132*    No results found for this or any previous visit (from the past 240 hour(s)).   Studies: Dg Chest Portable 1 View  Result Date: 06/04/2018 CLINICAL DATA:  Chest pain EXAM: PORTABLE CHEST 1 VIEW COMPARISON:  11/05/2014 chest radiograph. FINDINGS: Surgical hardware from ACDF overlies the lower cervical spine. Stable cardiomediastinal silhouette with mild cardiomegaly. No pneumothorax. No pleural effusion. Lungs appear clear, with no acute consolidative airspace disease and no pulmonary edema. IMPRESSION: Stable mild cardiomegaly without pulmonary edema. No active pulmonary disease. Electronically Signed   By: Delbert Phenix M.D.   On: 06/04/2018 14:37    Scheduled Meds: . aspirin  324 mg Oral Once  . aspirin EC  81 mg Oral Daily  . atorvastatin  40 mg Oral Daily  . fenofibrate  160 mg Oral Daily  . hydrALAZINE  25 mg Oral Daily  . insulin aspart  0-5 Units Subcutaneous QHS  . insulin aspart  0-9 Units Subcutaneous TID WC  . insulin glargine  10 Units Subcutaneous Daily  . labetalol  200 mg Oral BID  . levothyroxine  125 mcg Oral QAC breakfast  . pantoprazole  40 mg Oral Daily  . sodium chloride flush  3 mL Intravenous Once  . sodium chloride flush  3 mL Intravenous Q12H   Continuous Infusions: . sodium chloride    . heparin 1,600 Units/hr (06/06/18 0900)    Principal Problem:   NSTEMI (non-ST elevated myocardial infarction) [facility]) Active Problems:   Chest pain   Type 2 diabetes mellitus without complication, with long-term current use  of insulin (HCC)   CAD s/p stenting x3   CKD (chronic kidney disease), stage III (HCC) - baseline Scr 1.3   Hypertension associated with diabetes (HCC)   Hypothyroidism   Atrial fibrillation (HCC)   Deafness in left ear   Dyslipidemia associated with type 2 diabetes mellitus (HCC)   GERD (gastroesophageal reflux disease)    Time spent: 45 minutes    [facility] M NP  [facility]  If 7PM-7AM, please contact night-coverage at [email or web address], password [facility] 06/06/2018, 11:58 AM  LOS: 0 days

## 2018-06-07 ENCOUNTER — Encounter (HOSPITAL_COMMUNITY): Payer: HMO

## 2018-06-07 ENCOUNTER — Other Ambulatory Visit: Payer: Self-pay

## 2018-06-07 ENCOUNTER — Other Ambulatory Visit: Payer: Self-pay | Admitting: *Deleted

## 2018-06-07 DIAGNOSIS — I480 Paroxysmal atrial fibrillation: Secondary | ICD-10-CM | POA: Diagnosis not present

## 2018-06-07 DIAGNOSIS — I200 Unstable angina: Secondary | ICD-10-CM | POA: Diagnosis not present

## 2018-06-07 DIAGNOSIS — I214 Non-ST elevation (NSTEMI) myocardial infarction: Secondary | ICD-10-CM | POA: Diagnosis not present

## 2018-06-07 DIAGNOSIS — I2510 Atherosclerotic heart disease of native coronary artery without angina pectoris: Secondary | ICD-10-CM

## 2018-06-07 DIAGNOSIS — I872 Venous insufficiency (chronic) (peripheral): Secondary | ICD-10-CM | POA: Diagnosis not present

## 2018-06-07 DIAGNOSIS — R079 Chest pain, unspecified: Secondary | ICD-10-CM | POA: Diagnosis not present

## 2018-06-07 LAB — CBC
HCT: 33 % — ABNORMAL LOW (ref 39.0–52.0)
Hemoglobin: 10.5 g/dL — ABNORMAL LOW (ref 13.0–17.0)
MCH: 29.7 pg (ref 26.0–34.0)
MCHC: 31.8 g/dL (ref 30.0–36.0)
MCV: 93.5 fL (ref 80.0–100.0)
Platelets: 178 10*3/uL (ref 150–400)
RBC: 3.53 MIL/uL — ABNORMAL LOW (ref 4.22–5.81)
RDW: 12.7 % (ref 11.5–15.5)
WBC: 4 10*3/uL (ref 4.0–10.5)
nRBC: 0 % (ref 0.0–0.2)

## 2018-06-07 LAB — GLUCOSE, CAPILLARY: Glucose-Capillary: 107 mg/dL — ABNORMAL HIGH (ref 70–99)

## 2018-06-07 LAB — HEPARIN LEVEL (UNFRACTIONATED): Heparin Unfractionated: 0.62 IU/mL (ref 0.30–0.70)

## 2018-06-07 MED ORDER — NITROGLYCERIN 0.4 MG SL SUBL: 0.4000 mg | SUBLINGUAL_TABLET | SUBLINGUAL | 0 refills | Status: DC | PRN

## 2018-06-07 NOTE — Plan of Care (Signed)

## 2018-06-07 NOTE — Patient Outreach (Signed)
  [facility]) Care Management Chronic Special Needs Program  06/07/2018  Name: Patrick Duran DOB: [birth year]  MRN: [id number]  [name] is enrolled in a chronic special needs plan for Diabetes. A completed health risk assessment has not been received from the client and client declines to complete assessment by their health care concierge. Per Concierge, client is not going to complete Health Risk Assessment and does not want to be contacted.  The client's individualized care plan was developed based on available data.  Plan:  . Send letter with a copy of individualized care plan to client . Send individualized care plan to provider . Send educational material  Chronic care management coordinator will send educational material as scheduled.   Kathyrn Sheriff, RN, MSN, [facility] Chronic Care Management Coordinator [facility] [phone]

## 2018-06-07 NOTE — Progress Notes (Signed)
ANTICOAGULATION CONSULT NOTE  Pharmacy Consult for heparin Indication: chest pain/ACS  Allergies  Allergen Reactions  . Codeine Nausea Only  . Codeine Sulfate     REACTION: nausea  . Eggs Or Egg-Derived Products   . Hydrocodone Nausea Only    nausea  . Oxycodone Nausea Only  . Peanut-Containing Drug Products     And all nuts  . Percocet [Oxycodone-Acetaminophen]     nausea    Patient Measurements: Height: 6\' 1"  (185.4 cm) Weight: 232 lb (105.2 kg) IBW/kg (Calculated) : 79.9 Heparin Dosing Weight: 101.2 kg  Vital Signs: Temp: 97.7 F (36.5 C) (03/11 0843) Temp Source: Oral (03/11 0843) BP: 130/63 (03/11 0843) Pulse Rate: 53 (03/11 0843)  Labs: Recent Labs    06/04/18 1329  06/05/18 0209 06/05/18 0441 06/05/18 0927 06/05/18 1433 06/05/18 2052 06/06/18 0515 06/06/18 1906 06/07/18 0502  HGB 12.8*  --   --   --  12.0*  --   --  10.9*  --  10.5*  HCT 39.5  --   --   --  36.1*  --   --  33.9*  --  33.0*  PLT 243  --   --   --  211  --   --  184  --  178  APTT  --   --  53*  --   --   --   --  30 93*  --   LABPROT 14.7  --   --   --   --   --   --   --   --   --   INR 1.2  --   --   --   --   --   --   --   --   --   HEPARINUNFRC  --    < > 0.94*  --   --   --   --  <0.10* 0.56 0.62  CREATININE 1.36*  --   --  1.36*  --   --   --  1.35*  --   --   TROPONINI  --    < > <0.03  --  <0.03 <0.03 <0.03  --   --   --    < > = values in this interval not displayed.    Estimated Creatinine Clearance: 63.9 mL/min (A) (by C-G formula based on SCr of 1.35 mg/dL (H)).   Medical History: Past Medical History:  Diagnosis Date  . Arthritis    "fingers, right hip" (11/07/2014)  . Complication of anesthesia    "trembling during my 1st cath"  . Coronary artery disease   . Easy bruising   . GERD (gastroesophageal reflux disease)   . Headache    "monthly" (11/07/2014)  . Hearing difficulty of both ears    "80% loss in my left; high end hearing loss in my right ear"  (11/07/2014)  . History of stomach ulcers   . Hyperlipidemia   . Hypertension   . Hypothyroidism   . Myocardial infarction (HCC) 03/2014  . PONV (postoperative nausea and vomiting)   . Sleep apnea    "probably; didn't want to do the test" (11/07/2014)  . Trouble swallowing   . Type II diabetes mellitus (HCC)   . Vertigo     Medications:  Scheduled:  . aspirin  324 mg Oral Once  . aspirin EC  81 mg Oral Daily  . atorvastatin  40 mg Oral Daily  . fenofibrate  160 mg Oral Daily  .  hydrALAZINE  25 mg Oral Daily  . insulin aspart  0-5 Units Subcutaneous QHS  . insulin aspart  0-9 Units Subcutaneous TID WC  . insulin glargine  10 Units Subcutaneous Daily  . labetalol  200 mg Oral BID  . levothyroxine  125 mcg Oral QAC breakfast  . pantoprazole  40 mg Oral Daily  . sodium chloride flush  3 mL Intravenous Once  . sodium chloride flush  3 mL Intravenous Q12H    Assessment: 72 yom presenting with chest pain and diaphoresis. Has extensive hx of CAD w/ 3 prior stents. On apixaban PTA for hx Afib- LD 3/8@0700 .   Heparin level therapeutic at 0.62. No s/sx of bleeding. No infusion issues per nursing.    Goal of Therapy:  Heparin level 0.3-0.7 units/ml aPTT 66-102 seconds Monitor platelets by anticoagulation protocol: Yes   Plan:  Continue heparin infusion at 1600 units/hr Check HL daily Continue to monitor Hgb and platelets  Thanks, Carlena Hurl, PharmD Candidate 06/07/2018,9:05 AM

## 2018-06-07 NOTE — Progress Notes (Signed)
Called for patient to come to PFT lab for his pre-op pulmonary function testing.  When transporter arrived to the unit to bring the patient to the lab, the patient refused to have the testing done.  Transporter let the RN know the situation.  CVTS RN Alycia Rossetti made aware as well.

## 2018-06-07 NOTE — Discharge Summary (Addendum)
Physician Discharge Summary  SHION BLUESTEIN EAV:[id number] DOB: 08-06-1945 DOA: 06/04/2018  PCP: Ardith Dark, MD  Admit date: 06/04/2018 Discharge date: 06/07/2018  Admitted From: Home  Disposition:  Home   Recommendations for Outpatient Follow-up and new medication changes:  1. Patient will return on 06/09/2018 for CABG.  2. Apixaban on hold, but ok to continue aspirin. 3. Added as needed sublingual nitroglycerin.  4. Hold metformin due to coming surgery.   Home Health: no   Equipment/Devices: no   Discharge Condition: stable  CODE STATUS: full  Diet recommendation:  Heart healthy and diabetic prudent.   Brief/Interim Summary: 73 year old male who presented with chest pain.  He does have diagnosis of type 2 diabetes mellitus, coronary artery disease status post multiple stents, hypertension, and chronic kidney disease stage III.  He reported acute onset of chest pain beneath both shoulders, associated with nausea, no radiation.  No improved after aspirin.  He had similar symptoms day prior.  On his initial physical examination he was bradycardic, 46 to 58 bpm, blood pressure 126/58, respiratory 13, oxygen saturation 95%.  He was noted to have JVD, heart S1-S2 present, bradycardic, lungs are clear to auscultation bilaterally, abdomen was soft, positive lower extremity edema bilaterally.  Healing venous stasis ulcer right lower extremity.  Sodium 138, potassium 4.1, chloride 111, bicarb 19, glucose 96, BUN 18, creatinine 1.36, troponin less than 0.03, white count 5.3, hemoglobin 12.8, hematocrit 39.5, platelets 243.  His chest x-ray showed cardiomegaly with hilar vascular congestion.  His EKG had sinus bradycardia, 50 bpm, left axis deviation, normal intervals, normal conduction, no significant ST segment or T wave changes.  Patient was admitted to the hospital looking diagnosis of chest pain to rule out acute coronary syndrome.  1.  Unstable angina, with severe coronary artery disease.   Patient was admitted to the telemetry unit he was placed on on unfractionated IV heparin, sublingual nitroglycerin, labetalol, atorvastatin and aspirin, no further chest pain.  He underwent cardiac catheterization, LAD proximal 30%, mid 40%, left circumflex 40%, RCA with ISR (instent restenosis), mid 90%, distal 70%, diffuse 80%.  Given recurrent ISR with 3 layers of prior stents, recommendation for coronary bypass grafting was made.  Dr. Dorris Fetch was contacted patient will have coronary bypass grafting March 13.  Currently he has been stable to be discharged home, continue aspirin and as needed nitroglycerin.  Patient underwent vascular ultrasound Doppler studies, right and left carotid arteries with no significant stenosis, right and left ABI within normal range.  2.  Paroxysmal atrial fibrillation.  Continue rate control with labetalol, anticoagulation (apixaban) will be held in the setting of future surgery.  Possible atrial clip and pulmonary vein isolation during bypass surgery.   3.  Hypertension.  Remained well control with labetalol and lisnpril. To resume hydralazine at discharge.   4.  Type 2 diabetes mellitus.  Patient will resume glipizide and long-acting insulin, hold metformin for now, in the setting of future surgery.  5.  Hypothyroidism.  Continue levothyroxine.  6.  Hypomagnesemia clinically resolved.  7.  Right extremity venous insufficiency ulcer.  Present on admission, currently healing.  8. Dyslipidemia. Continue niacin, atorvastatin and fenofibrate.   9. CKD stage 3. Renal function remained stable with serum cr at 1,35 with K at 3,7 and serum bicarbonate at 21.   Discharge Diagnoses:  Principal Problem:   NSTEMI (non-ST elevated myocardial infarction) [facility]) Active Problems:   Chest pain   Hypertension associated with diabetes (HCC)   Type 2 diabetes mellitus  without complication, with long-term current use of insulin (HCC)   Hypothyroidism   Dyslipidemia associated  with type 2 diabetes mellitus (HCC)   CAD s/p stenting x3   Atrial fibrillation (HCC)   Deafness in left ear   GERD (gastroesophageal reflux disease)   CKD (chronic kidney disease), stage III (HCC) - baseline Scr 1.3   Unstable angina (HCC)    Discharge Instructions   Allergies as of 06/07/2018      Reactions   Codeine Nausea Only   Codeine Sulfate    REACTION: nausea   Eggs Or Egg-derived Products    Hydrocodone Nausea Only   nausea   Oxycodone Nausea Only   Peanut-containing Drug Products    And all nuts   Percocet [oxycodone-acetaminophen]    nausea      Medication List    STOP taking these medications   apixaban 5 MG Tabs tablet Commonly known as:  ELIQUIS   metFORMIN 1000 MG tablet Commonly known as:  GLUCOPHAGE     TAKE these medications   acetaminophen 500 MG tablet Commonly known as:  TYLENOL Take 500 mg by mouth every 6 (six) hours as needed. For pain   Alpha-Lipoic Acid 300 MG Caps Take by mouth daily.   aspirin 81 MG EC tablet Take 1 tablet (81 mg total) by mouth daily.   atorvastatin 40 MG tablet Commonly known as:  LIPITOR Take 1 tablet (40 mg total) by mouth daily.   co-enzyme Q-10 30 MG capsule Take 120 mg by mouth daily.   diazepam 5 MG tablet Commonly known as:  VALIUM Take 1 tablet (5 mg total) by mouth every 6 (six) hours as needed for anxiety.   Dramamine 50 MG tablet Generic drug:  dimenhyDRINATE Take 50 mg by mouth every 8 (eight) hours as needed.   fenofibrate micronized 134 MG capsule Commonly known as:  LOFIBRA Take 1 capsule (134 mg total) by mouth daily before breakfast.   glipiZIDE 10 MG 24 hr tablet Commonly known as:  GLUCOTROL XL Take 1 tablet (10 mg total) by mouth 2 (two) times daily. What changed:    how much to take  when to take this   hydrALAZINE 25 MG tablet Commonly known as:  APRESOLINE Take 25 mg by mouth daily.   Insulin Glargine 100 UNIT/ML Solostar Pen Commonly known as:  LANTUS Inject 20  Units into the skin as directed. 15-35 units q a.m. What changed:  how much to take   labetalol 200 MG tablet Commonly known as:  NORMODYNE Take 200 mg by mouth 2 (two) times daily.   levothyroxine 125 MCG tablet Commonly known as:  SYNTHROID, LEVOTHROID Take 1 tablet (125 mcg total) by mouth daily before breakfast.   lisinopril 10 MG tablet Commonly known as:  PRINIVIL,ZESTRIL Take 1 tablet (10 mg total) by mouth daily.   meclizine 12.5 MG tablet Commonly known as:  ANTIVERT Take 1 tablet (12.5 mg total) by mouth 3 (three) times daily as needed for dizziness.   MULTIVITAMIN PO Take 1 tablet by mouth daily.   niacin 100 MG tablet Take 100 mg by mouth daily.   nitroGLYCERIN 0.4 MG SL tablet Commonly known as:  NITROSTAT Place 1 tablet (0.4 mg total) under the tongue every 5 (five) minutes x 3 doses as needed for chest pain.   pantoprazole 40 MG tablet Commonly known as:  PROTONIX Take 1 tablet (40 mg total) by mouth daily.   POTASSIUM GLUCONATE PO Take 1 tablet by mouth daily.  vitamin B-12 100 MCG tablet Commonly known as:  CYANOCOBALAMIN Take 100 mcg by mouth daily.   ZINC ACETATE PO Take 1 tablet by mouth daily.       Allergies  Allergen Reactions  . Codeine Nausea Only  . Codeine Sulfate     REACTION: nausea  . Eggs Or Egg-Derived Products   . Hydrocodone Nausea Only    nausea  . Oxycodone Nausea Only  . Peanut-Containing Drug Products     And all nuts  . Percocet [Oxycodone-Acetaminophen]     nausea    Consultations:  Cardiology   CT surgery    Procedures/Studies: Dg Chest Portable 1 View  Result Date: 06/04/2018 CLINICAL DATA:  Chest pain EXAM: PORTABLE CHEST 1 VIEW COMPARISON:  11/05/2014 chest radiograph. FINDINGS: Surgical hardware from [facility] overlies the lower cervical spine. Stable cardiomediastinal silhouette with mild cardiomegaly. No pneumothorax. No pleural effusion. Lungs appear clear, with no acute consolidative airspace disease  and no pulmonary edema. IMPRESSION: Stable mild cardiomegaly without pulmonary edema. No active pulmonary disease. Electronically Signed   By: Delbert Phenix M.D.   On: 06/04/2018 14:37   Vas US Doppler Pre Cabg  Result Date: 06/06/2018 PREOPERATIVE VASCULAR EVALUATION  Indications:  Pre cabg. Risk Factors: Hypertension, hyperlipidemia, Diabetes. Performing Technologist: Blanch Media RVS  Examination Guidelines: A complete evaluation includes B-mode imaging, spectral Doppler, color Doppler, and power Doppler as needed of all accessible portions of each vessel. Bilateral testing is considered an integral part of a complete examination. Limited examinations for reoccurring indications may be performed as noted.  Right Carotid Findings: +----------+--------+--------+--------+-----------+--------+           PSV cm/sEDV cm/sStenosisDescribe   Comments +----------+--------+--------+--------+-----------+--------+ CCA Prox  76      16              homogeneous         +----------+--------+--------+--------+-----------+--------+ CCA Distal76      16              homogeneous         +----------+--------+--------+--------+-----------+--------+ ICA Prox  117     31      1-39%   homogeneous         +----------+--------+--------+--------+-----------+--------+ ICA Distal90      28                                  +----------+--------+--------+--------+-----------+--------+ ECA       175                                         +----------+--------+--------+--------+-----------+--------+ Portions of this table do not appear on this page. +----------+--------+-------+--------+------------+           PSV cm/sEDV cmsDescribeArm Pressure +----------+--------+-------+--------+------------+ [id number]                    134          +----------+--------+-------+--------+------------+ +---------+--------+--+--------+-+---------+ VertebralPSV cm/s51EDV cm/s8Antegrade  +---------+--------+--+--------+-+---------+ Left Carotid Findings: +----------+--------+--------+--------+-----------+--------+           PSV cm/sEDV cm/sStenosisDescribe   Comments +----------+--------+--------+--------+-----------+--------+ CCA Prox  114     19              homogeneous         +----------+--------+--------+--------+-----------+--------+ CCA Distal88      15  homogeneous         +----------+--------+--------+--------+-----------+--------+ ICA Prox  73      18      1-39%   homogeneous         +----------+--------+--------+--------+-----------+--------+ ICA Distal61      15                                  +----------+--------+--------+--------+-----------+--------+ ECA       79                                          +----------+--------+--------+--------+-----------+--------+ +----------+--------+--------+--------+------------+ SubclavianPSV cm/sEDV cm/sDescribeArm Pressure +----------+--------+--------+--------+------------+           98                      145          +----------+--------+--------+--------+------------+ +---------+--------+--+--------+--+---------+ VertebralPSV cm/s36EDV cm/s10Antegrade +---------+--------+--+--------+--+---------+  ABI Findings: +--------+------------------+-----+---------+--------+ Right   Rt Pressure (mmHg)IndexWaveform Comment  +--------+------------------+-----+---------+--------+ [id number]                    triphasic         +--------+------------------+-----+---------+--------+ PTA     188               1.30 triphasic         +--------+------------------+-----+---------+--------+ DP      172               1.19 triphasic         +--------+------------------+-----+---------+--------+ +--------+------------------+-----+---------+-------+ Left    Lt Pressure (mmHg)IndexWaveform Comment +--------+------------------+-----+---------+-------+ [id number]                     triphasic        +--------+------------------+-----+---------+-------+ PTA     184               1.27 triphasic        +--------+------------------+-----+---------+-------+ DP      174               1.20 triphasic        +--------+------------------+-----+---------+-------+ +-------+---------------+----------------+ ABI/TBIToday's ABI/TBIPrevious ABI/TBI +-------+---------------+----------------+ Right  1.30                            +-------+---------------+----------------+ Left   1.27                            +-------+---------------+----------------+  Right Doppler Findings: +--------+--------+-----+---------+--------+ Site    PressureIndexDoppler  Comments +--------+--------+-----+---------+--------+ [id number]          triphasic         +--------+--------+-----+---------+--------+ Radial               triphasic         +--------+--------+-----+---------+--------+ Ulnar                triphasic         +--------+--------+-----+---------+--------+  Left Doppler Findings: +--------+--------+-----+---------+--------+ Site    PressureIndexDoppler  Comments +--------+--------+-----+---------+--------+ [id number]          triphasic         +--------+--------+-----+---------+--------+ Radial               triphasic         +--------+--------+-----+---------+--------+  Ulnar                triphasic         +--------+--------+-----+---------+--------+  Summary: Right Carotid: Velocities in the right ICA are consistent with a 1-39% stenosis. Left Carotid: Velocities in the left ICA are consistent with a 1-39% stenosis. Vertebrals: Bilateral vertebral arteries demonstrate antegrade flow. Right ABI: Resting right ankle-brachial index is within normal range. No evidence of significant right lower extremity arterial disease. Left ABI: Resting left ankle-brachial index is within normal range. No evidence of significant left  lower extremity arterial disease. Right Upper Extremity: Doppler waveform obliterate with right radial compression. Doppler waveform obliterate with right ulnar compression. Left Upper Extremity: Doppler waveform obliterate with left radial compression. Doppler waveforms remain within normal limits with left ulnar compression.  Electronically signed by Waverly Ferrari MD on 06/06/2018 at 4:26:23 PM.    Final        Subjective: Patient is feeling well, no chest pain or dyspnea, has been ambulating.   Discharge Exam: Vitals:   06/07/18 0350 06/07/18 0843  BP: 121/66 130/63  Pulse: (!) 58 (!) 53  Resp: 16   Temp: 97.6 F (36.4 C) 97.7 F (36.5 C)  SpO2: 100% 100%   Vitals:   06/06/18 2033 06/06/18 2139 06/07/18 0350 06/07/18 0843  BP: (!) 148/68 (!) 120/105 121/66 130/63  Pulse: (!) 54 (!) 55 (!) 58 (!) 53  Resp: 16  16   Temp: 97.6 F (36.4 C)  97.6 F (36.4 C) 97.7 F (36.5 C)  TempSrc: Oral  Oral Oral  SpO2: 98%  100% 100%  Weight:   105.2 kg   Height:        General: Not in pain or dyspnea  Neurology: Awake and alert, non focal  E ENT: no pallor, no icterus, oral mucosa moist Cardiovascular: No JVD. S1-S2 present, rhythmic, no gallops, rubs, or murmurs. Trace pitting lower extremity edema. Pulmonary: positive breath sounds bilaterally, adequate air movement, no wheezing, rhonchi or rales. Gastrointestinal. Abdomen with no organomegaly, non tender, no rebound or guarding Skin. No rashes Musculoskeletal: no joint deformities   The results of significant diagnostics from this hospitalization (including imaging, microbiology, ancillary and laboratory) are listed below for reference.     Microbiology: No results found for this or any previous visit (from the past 240 hour(s)).   Labs: BNP (last 3 results) Recent Labs    06/04/18 1329  BNP 125.1*   Basic Metabolic Panel: Recent Labs  Lab 06/04/18 1329 06/04/18 1619 06/05/18 0441 06/06/18 0515  NA 138  --   141 138  K 4.1  --  3.8 3.7  CL 111  --  112* 109  CO2 19*  --  21* 21*  GLUCOSE 96  --  114* 135*  BUN 18  --  17 12  CREATININE 1.36*  --  1.36* 1.35*  CALCIUM 9.4  --  9.0 8.5*  MG  --  1.6*  --   --    Liver Function Tests: No results for input(s): AST, ALT, ALKPHOS, BILITOT, PROT, ALBUMIN in the last 168 hours. No results for input(s): LIPASE, AMYLASE in the last 168 hours. No results for input(s): AMMONIA in the last 168 hours. CBC: Recent Labs  Lab 06/04/18 1329 06/05/18 0927 06/06/18 0515 06/07/18 0502  WBC 5.3 5.1 4.4 4.0  HGB 12.8* 12.0* 10.9* 10.5*  HCT 39.5 36.1* 33.9* 33.0*  MCV 93.6 93.3 93.6 93.5  PLT 243 211 184 178   Cardiac  Enzymes: Recent Labs  Lab 06/04/18 2311 06/05/18 0209 06/05/18 0927 06/05/18 1433 06/05/18 2052  TROPONINI <0.03 <0.03 <0.03 <0.03 <0.03   BNP: Invalid input(s): POCBNP CBG: Recent Labs  Lab 06/06/18 0434 06/06/18 1206 06/06/18 1639 06/06/18 2148 06/07/18 0553  GLUCAP 132* 173* 110* 147* 107*   D-Dimer No results for input(s): DDIMER in the last 72 hours. Hgb A1c Recent Labs    06/06/18 0515  HGBA1C 5.8*   Lipid Profile Recent Labs    06/06/18 0515  CHOL 112  HDL 23*  LDLCALC 61  TRIG 833  CHOLHDL 4.9   Thyroid function studies Recent Labs    06/05/18 1433  TSH 1.426   Anemia work up No results for input(s): VITAMINB12, FOLATE, FERRITIN, TIBC, IRON, RETICCTPCT in the last 72 hours. Urinalysis    Component Value Date/Time   COLORURINE YELLOW 02/09/2011 0831   APPEARANCEUR CLEAR 02/09/2011 0831   LABSPEC >1.030 (H) 02/09/2011 0831   PHURINE 5.5 02/09/2011 0831   GLUCOSEU NEGATIVE 02/09/2011 0831   HGBUR NEGATIVE 02/09/2011 0831   BILIRUBINUR NEGATIVE 02/09/2011 0831   KETONESUR NEGATIVE 02/09/2011 0831   PROTEINUR 30 (A) 02/09/2011 0831   UROBILINOGEN 0.2 02/09/2011 0831   NITRITE NEGATIVE 02/09/2011 0831   LEUKOCYTESUR NEGATIVE 02/09/2011 0831   Sepsis Labs Invalid input(s):  PROCALCITONIN,  WBC,  LACTICIDVEN Microbiology No results found for this or any previous visit (from the past 240 hour(s)).   Time coordinating discharge: 45 minutes  SIGNED:   Coralie Keens, MD  [facility] 06/07/2018, 10:36 AM

## 2018-06-07 NOTE — Progress Notes (Signed)
Subjective:  No chest pain this morning. Patient is extremely frustrated and wants to go home.  Objective:  Vital Signs in the last 24 hours: Temp:  [97.6 F (36.4 C)-97.7 F (36.5 C)] 97.7 F (36.5 C) (03/11 0843) Pulse Rate:  [53-58] 53 (03/11 0843) Resp:  [16] 16 (03/11 0350) BP: (120-148)/(63-105) 130/63 (03/11 0843) SpO2:  [98 %-100 %] 100 % (03/11 0843) Weight:  [105.2 kg] 105.2 kg (03/11 0350)  Intake/Output from previous day: 03/10 0701 - 03/11 0700 In: 723 [P.O.:720; I.V.:3] Out: 1300 [Urine:1300]  Physical Exam Constitutional: He is oriented to person, place, and time. He appears well-developed and well-nourished. No distress.  HENT:  Head: Normocephalic and atraumatic.  Eyes: Pupils are equal, round, and reactive to light. Conjunctivae are normal.  Neck: No JVD present.  Cardiovascular: Normal rate, regular rhythm and intact distal pulses.  No murmur heard. Pulmonary/Chest: Effort normal and breath sounds normal. He has no wheezes. He has no rales.  Abdominal: Soft. Bowel sounds are normal. There is no rebound.  Musculoskeletal:        General: Edema (1+ b/l.  Discoloration bilateral legs medial aspect.  Erythema right leg medial aspect.) present.  Lymphadenopathy:    He has no cervical adenopathy.  Neurological: He is alert and oriented to person, place, and time. No cranial nerve deficit.  Skin: Skin is warm and dry.  Psychiatric: Anxious, agitated. Nursing note and vitals reviewed.   Lab Results: BMP Recent Labs    06/04/18 1329 06/05/18 0441 06/06/18 0515  NA 138 141 138  K 4.1 3.8 3.7  CL 111 112* 109  CO2 19* 21* 21*  GLUCOSE 96 114* 135*  BUN 18 17 12   CREATININE 1.36* 1.36* 1.35*  CALCIUM 9.4 9.0 8.5*  GFRNONAA 52* 52* 52*  GFRAA >60 >60 >60    CBC Recent Labs  Lab 06/07/18 0502  WBC 4.0  RBC 3.53*  HGB 10.5*  HCT 33.0*  PLT 178  MCV 93.5  MCH 29.7  MCHC 31.8  RDW 12.7    HEMOGLOBIN A1C Lab Results  Component Value Date    HGBA1C 5.8 (H) 06/06/2018   MPG 119.76 06/06/2018    Cardiac Panel (last 3 results) Recent Labs    06/05/18 0927 06/05/18 1433 06/05/18 2052  TROPONINI <0.03 <0.03 <0.03    BNP (last 3 results) Recent Labs    06/04/18 1329  BNP 125.1*    TSH Recent Labs    06/05/18 1433  TSH 1.426    Lipid Panel     Component Value Date/Time   CHOL 112 06/06/2018 0515   TRIG 139 06/06/2018 0515   HDL 23 (L) 06/06/2018 0515   CHOLHDL 4.9 06/06/2018 0515   VLDL 28 06/06/2018 0515   LDLCALC 61 06/06/2018 0515   LDLDIRECT 106.6 10/12/2007 0906     CARDIAC STUDIES:  Coronary angiography 06/05/2018: LM: Normal LAD: Prox 30%, mid 40% focal stenoses LCx: Distal 40% focal stenosis RCA: Focal 90% ISR in prox-mid, mid RCA Distal RCA 70% edge restenosis Diffuse 80% ISR in distal RCA Rest, moderate diffuse ISR Good surgical targets with RPDA and RPLA  Recommendation: Given recurrent ISR with three layers of prior stents, recommend consideration for CABG.    EKG 06/04/2018: Sinus bradycardia 50 bpm. Inferior T wave inversions are new. Consider ischemia  Echocardiogram 06/05/2018: 1. The left ventricle has normal systolic function, with an ejection fraction of 55-60%. The cavity size was normal. There is mild concentric left ventricular hypertrophy. Left ventricular diastolic Doppler  parameters are consistent with pseudonormalization. Mild basal inferior hypokinesis.   2. The right ventricle has normal systolic function. The cavity was normal. There is no increase in right ventricular wall thickness.  3. Left atrial size was moderately dilated.  4. Right atrial size was mildly dilated.    Assessment & Recommendations:  73 y/o Caucasian male  with CAD, multiple prior PCI's with recurrent RCA ISR, normal stress test 2017, paroxysmal atrial fibrillation, hypertension, type 2 DM, vertigo, admitted with  Chest pain: Currently resolved. Coronary angiography shows multiple  areas of moderate to severe ISR in prox to distal RCA.Given three layers of stents with recurrent ISR, best option is CABG. He has good targets in RPDA and RPLA. Discussed with Dr. Dorris Fetch. Appreciate his help. Tentative plan for surgery later this week.   Patient is extremely anxious and agitated  and wants to go home and come back outpatient for surgery.  Purely from coronary anatomy standpoint, I do not think he has any acute unstable lesions at this time.  I have discussed with Dr. Dorris Fetch.  It is reasonable to discharge him today and bring him back for outpatient CABG on 06/09/2018.  Please do not resume Eliquis on discharge.  Okay to continue aspirin.  PAF: CHA2DS2VASc score 4, annual stroke risk 5% Currently in sinus rhythm. Eliquis on hold with upcoming CABG.  Venous insufficiency: No ulcer, but has erythema RLL. Consider wound consult. He may need consideration for endovenous ablation in the future.    Elder Negus, M.D. 06/07/2018, 9:00 AM [facility] Pager: [phone] Office: [phone] If no answer: [phone]

## 2018-06-07 NOTE — Progress Notes (Signed)
      [address]       [place] [address]             [phone]      Patrick Duran is insistent on going home . Is scheduled for surgery on Friday. Refused PFT I made it clear I do not think him leaving is a good idea. He is at risk for MI. He understands but is going to leave regardless. Better exam of radials today. Right radial Allen's test abnormal. Left Allen's test- there is reperfusion with radial occlusion. Will plan left radial and reevaluate with Doppler in OR. If questionable will use right IMA, but I am concerned about length with that graft.  He does not need PFTs  [name], MD [facility] [phone]

## 2018-06-08 ENCOUNTER — Encounter (HOSPITAL_COMMUNITY): Payer: Self-pay | Admitting: *Deleted

## 2018-06-08 ENCOUNTER — Other Ambulatory Visit: Payer: Self-pay

## 2018-06-08 MED ORDER — TRANEXAMIC ACID (OHS) BOLUS VIA INFUSION
15.0000 mg/kg | INTRAVENOUS | Status: AC
  Administered 2018-06-09: 1578 mg via INTRAVENOUS
  Filled 2018-06-08: qty 1578

## 2018-06-08 MED ORDER — MILRINONE LACTATE IN DEXTROSE 20-5 MG/100ML-% IV SOLN
0.3000 ug/kg/min | INTRAVENOUS | Status: DC
  Filled 2018-06-08: qty 100

## 2018-06-08 MED ORDER — SODIUM CHLORIDE 0.9 % IV SOLN
1.5000 g | INTRAVENOUS | Status: AC
  Administered 2018-06-09: 1.5 g via INTRAVENOUS
  Filled 2018-06-08: qty 1.5

## 2018-06-08 MED ORDER — TRANEXAMIC ACID (OHS) PUMP PRIME SOLUTION
2.0000 mg/kg | INTRAVENOUS | Status: DC
  Filled 2018-06-08: qty 2.1

## 2018-06-08 MED ORDER — NOREPINEPHRINE BITARTRATE 1 MG/ML IV SOLN
0.0000 ug/min | INTRAVENOUS | Status: DC
  Filled 2018-06-08: qty 4

## 2018-06-08 MED ORDER — TRANEXAMIC ACID 1000 MG/10ML IV SOLN
1.5000 mg/kg/h | INTRAVENOUS | Status: AC
  Administered 2018-06-09: 1.5 mg/kg/h via INTRAVENOUS
  Filled 2018-06-08: qty 25

## 2018-06-08 MED ORDER — INSULIN REGULAR(HUMAN) IN NACL 100-0.9 UT/100ML-% IV SOLN
INTRAVENOUS | Status: AC
  Administered 2018-06-09: 1.4 [IU]/h via INTRAVENOUS
  Filled 2018-06-08: qty 100

## 2018-06-08 MED ORDER — MAGNESIUM SULFATE 50 % IJ SOLN
40.0000 meq | INTRAMUSCULAR | Status: DC
  Filled 2018-06-08: qty 9.85

## 2018-06-08 MED ORDER — DOPAMINE-DEXTROSE 3.2-5 MG/ML-% IV SOLN
0.0000 ug/kg/min | INTRAVENOUS | Status: DC
  Filled 2018-06-08: qty 250

## 2018-06-08 MED ORDER — SODIUM CHLORIDE 0.9 % IV SOLN
INTRAVENOUS | Status: DC
  Filled 2018-06-08: qty 30

## 2018-06-08 MED ORDER — PLASMA-LYTE 148 IV SOLN
INTRAVENOUS | Status: DC
  Filled 2018-06-08: qty 2.5

## 2018-06-08 MED ORDER — PHENYLEPHRINE HCL-NACL 20-0.9 MG/250ML-% IV SOLN
30.0000 ug/min | INTRAVENOUS | Status: AC
  Administered 2018-06-09: 10 ug/min via INTRAVENOUS
  Filled 2018-06-08: qty 250

## 2018-06-08 MED ORDER — DEXMEDETOMIDINE HCL IN NACL 400 MCG/100ML IV SOLN
0.1000 ug/kg/h | INTRAVENOUS | Status: AC
  Administered 2018-06-09: .3 ug/kg/h via INTRAVENOUS
  Filled 2018-06-08: qty 100

## 2018-06-08 MED ORDER — SODIUM CHLORIDE 0.9 % IV SOLN
750.0000 mg | INTRAVENOUS | Status: AC
  Administered 2018-06-09: 750 mg via INTRAVENOUS
  Filled 2018-06-08: qty 750

## 2018-06-08 MED ORDER — NITROGLYCERIN IN D5W 200-5 MCG/ML-% IV SOLN
2.0000 ug/min | INTRAVENOUS | Status: AC
  Administered 2018-06-09: 10 ug/min via INTRAVENOUS
  Filled 2018-06-08: qty 250

## 2018-06-08 MED ORDER — POTASSIUM CHLORIDE 2 MEQ/ML IV SOLN
80.0000 meq | INTRAVENOUS | Status: DC
  Filled 2018-06-08: qty 40

## 2018-06-08 MED ORDER — VANCOMYCIN HCL 10 G IV SOLR
1500.0000 mg | INTRAVENOUS | Status: AC
  Administered 2018-06-09: 1500 mg via INTRAVENOUS
  Filled 2018-06-08: qty 1500

## 2018-06-08 MED ORDER — EPINEPHRINE PF 1 MG/ML IJ SOLN
0.0000 ug/min | INTRAVENOUS | Status: DC
  Filled 2018-06-08: qty 4

## 2018-06-08 NOTE — Progress Notes (Signed)
Spoke with pt for pre-op call. Pt was just discharged yesterday from hospital, states he needed to be home to get his mind settled before CABG in the AM. Pt has long hx of CAD with stents and Dr. Jacinto Halim is his cardiologist. He states he has not had any chest pain since leaving the hospital yesterday. Pt is a type 2 diabetic. Last A1C was 5.8 on 06/06/18. Pt states that he adjusts his Lantus insulin according to what his blood sugar is. Instructed pt to take 1/2 of the dose that he would normally give himself in the AM. Pt voiced understanding.

## 2018-06-08 NOTE — Anesthesia Preprocedure Evaluation (Addendum)
Anesthesia Evaluation  Patient identified by MRN, date of birth, ID band Patient awake    Reviewed: Allergy & Precautions, NPO status , Patient's Chart, lab work & pertinent test results  History of Anesthesia Complications (+) PONV and history of anesthetic complications  Airway Mallampati: II  TM Distance: >3 FB     Dental  (+) Dental Advisory Given, Chipped, Missing,    Pulmonary former smoker,    breath sounds clear to auscultation       Cardiovascular hypertension, Pt. on medications and Pt. on home beta blockers + angina + CAD, + Past MI and + Cardiac Stents  + dysrhythmias  Rhythm:Regular Rate:Normal     Neuro/Psych CVA    GI/Hepatic Neg liver ROS, GERD  ,Normal EF. Valves okay   Endo/Other  diabetes, Type 2, Insulin Dependent, Oral Hypoglycemic AgentsHypothyroidism   Renal/GU CRFRenal disease     Musculoskeletal   Abdominal   Peds  Hematology  (+) anemia ,   Anesthesia Other Findings   Reproductive/Obstetrics                           Lab Results  Component Value Date   WBC 4.0 06/07/2018   HGB 10.5 (L) 06/07/2018   HCT 33.0 (L) 06/07/2018   MCV 93.5 06/07/2018   PLT 178 06/07/2018   Lab Results  Component Value Date   CREATININE 1.35 (H) 06/06/2018   BUN 12 06/06/2018   NA 138 06/06/2018   K 3.7 06/06/2018   CL 109 06/06/2018   CO2 21 (L) 06/06/2018    Anesthesia Physical Anesthesia Plan  ASA: IV  Anesthesia Plan: General   Post-op Pain Management:    Induction: Intravenous  PONV Risk Score and Plan: 2 and Dexamethasone, Treatment may vary due to age or medical condition and Midazolam  Airway Management Planned: Oral ETT  Additional Equipment: Arterial line, CVP, PA Cath, TEE and Ultrasound Guidance Line Placement  Intra-op Plan:   Post-operative Plan: Post-operative intubation/ventilation  Informed Consent: I have reviewed the patients History and  Physical, chart, labs and discussed the procedure including the risks, benefits and alternatives for the proposed anesthesia with the patient or authorized representative who has indicated his/her understanding and acceptance.     Dental advisory given  Plan Discussed with: CRNA  Anesthesia Plan Comments:        Anesthesia Quick Evaluation

## 2018-06-09 ENCOUNTER — Encounter (HOSPITAL_COMMUNITY): Payer: Self-pay | Admitting: *Deleted

## 2018-06-09 ENCOUNTER — Inpatient Hospital Stay (HOSPITAL_COMMUNITY): Payer: HMO | Admitting: Certified Registered Nurse Anesthetist

## 2018-06-09 ENCOUNTER — Inpatient Hospital Stay (HOSPITAL_COMMUNITY)
Admission: RE | Admit: 2018-06-09 | Discharge: 2018-06-14 | DRG: 229 | Discharge status: Against Medical Advice | Payer: HMO | Attending: Thoracic Surgery (Cardiothoracic Vascular Surgery) | Admitting: Thoracic Surgery (Cardiothoracic Vascular Surgery)

## 2018-06-09 ENCOUNTER — Inpatient Hospital Stay (HOSPITAL_COMMUNITY)
Admission: RE | Discharge status: Against Medical Advice | Payer: Self-pay | Source: Home / Self Care | Attending: Thoracic Surgery (Cardiothoracic Vascular Surgery)

## 2018-06-09 ENCOUNTER — Inpatient Hospital Stay (HOSPITAL_COMMUNITY): Payer: HMO

## 2018-06-09 DIAGNOSIS — K219 Gastro-esophageal reflux disease without esophagitis: Secondary | ICD-10-CM | POA: Diagnosis present

## 2018-06-09 DIAGNOSIS — E8770 Fluid overload, unspecified: Secondary | ICD-10-CM | POA: Diagnosis not present

## 2018-06-09 DIAGNOSIS — T82855A Stenosis of coronary artery stent, initial encounter: Principal | ICD-10-CM | POA: Diagnosis present

## 2018-06-09 DIAGNOSIS — J939 Pneumothorax, unspecified: Secondary | ICD-10-CM | POA: Diagnosis not present

## 2018-06-09 DIAGNOSIS — Z885 Allergy status to narcotic agent status: Secondary | ICD-10-CM

## 2018-06-09 DIAGNOSIS — Z951 Presence of aortocoronary bypass graft: Secondary | ICD-10-CM | POA: Diagnosis not present

## 2018-06-09 DIAGNOSIS — E876 Hypokalemia: Secondary | ICD-10-CM | POA: Diagnosis not present

## 2018-06-09 DIAGNOSIS — Z833 Family history of diabetes mellitus: Secondary | ICD-10-CM

## 2018-06-09 DIAGNOSIS — I25110 Atherosclerotic heart disease of native coronary artery with unstable angina pectoris: Secondary | ICD-10-CM | POA: Diagnosis not present

## 2018-06-09 DIAGNOSIS — Y831 Surgical operation with implant of artificial internal device as the cause of abnormal reaction of the patient, or of later complication, without mention of misadventure at the time of the procedure: Secondary | ICD-10-CM | POA: Diagnosis present

## 2018-06-09 DIAGNOSIS — J9811 Atelectasis: Secondary | ICD-10-CM | POA: Diagnosis not present

## 2018-06-09 DIAGNOSIS — Z794 Long term (current) use of insulin: Secondary | ICD-10-CM | POA: Diagnosis not present

## 2018-06-09 DIAGNOSIS — E785 Hyperlipidemia, unspecified: Secondary | ICD-10-CM | POA: Diagnosis not present

## 2018-06-09 DIAGNOSIS — Z419 Encounter for procedure for purposes other than remedying health state, unspecified: Secondary | ICD-10-CM

## 2018-06-09 DIAGNOSIS — Z9049 Acquired absence of other specified parts of digestive tract: Secondary | ICD-10-CM

## 2018-06-09 DIAGNOSIS — D62 Acute posthemorrhagic anemia: Secondary | ICD-10-CM | POA: Diagnosis not present

## 2018-06-09 DIAGNOSIS — R2689 Other abnormalities of gait and mobility: Secondary | ICD-10-CM | POA: Diagnosis not present

## 2018-06-09 DIAGNOSIS — Z91010 Allergy to peanuts: Secondary | ICD-10-CM

## 2018-06-09 DIAGNOSIS — H9191 Unspecified hearing loss, right ear: Secondary | ICD-10-CM | POA: Diagnosis not present

## 2018-06-09 DIAGNOSIS — I252 Old myocardial infarction: Secondary | ICD-10-CM

## 2018-06-09 DIAGNOSIS — J9383 Other pneumothorax: Secondary | ICD-10-CM | POA: Diagnosis not present

## 2018-06-09 DIAGNOSIS — I129 Hypertensive chronic kidney disease with stage 1 through stage 4 chronic kidney disease, or unspecified chronic kidney disease: Secondary | ICD-10-CM | POA: Diagnosis present

## 2018-06-09 DIAGNOSIS — E039 Hypothyroidism, unspecified: Secondary | ICD-10-CM | POA: Diagnosis present

## 2018-06-09 DIAGNOSIS — Z8711 Personal history of peptic ulcer disease: Secondary | ICD-10-CM | POA: Diagnosis not present

## 2018-06-09 DIAGNOSIS — I2510 Atherosclerotic heart disease of native coronary artery without angina pectoris: Secondary | ICD-10-CM

## 2018-06-09 DIAGNOSIS — G4730 Sleep apnea, unspecified: Secondary | ICD-10-CM | POA: Diagnosis not present

## 2018-06-09 DIAGNOSIS — E1122 Type 2 diabetes mellitus with diabetic chronic kidney disease: Secondary | ICD-10-CM | POA: Diagnosis present

## 2018-06-09 DIAGNOSIS — N183 Chronic kidney disease, stage 3 (moderate): Secondary | ICD-10-CM | POA: Diagnosis present

## 2018-06-09 DIAGNOSIS — I340 Nonrheumatic mitral (valve) insufficiency: Secondary | ICD-10-CM | POA: Diagnosis not present

## 2018-06-09 DIAGNOSIS — Z823 Family history of stroke: Secondary | ICD-10-CM

## 2018-06-09 DIAGNOSIS — Z87891 Personal history of nicotine dependence: Secondary | ICD-10-CM

## 2018-06-09 DIAGNOSIS — Z7982 Long term (current) use of aspirin: Secondary | ICD-10-CM

## 2018-06-09 DIAGNOSIS — Z7901 Long term (current) use of anticoagulants: Secondary | ICD-10-CM

## 2018-06-09 DIAGNOSIS — Z91012 Allergy to eggs: Secondary | ICD-10-CM | POA: Diagnosis not present

## 2018-06-09 DIAGNOSIS — J90 Pleural effusion, not elsewhere classified: Secondary | ICD-10-CM | POA: Diagnosis not present

## 2018-06-09 DIAGNOSIS — E1151 Type 2 diabetes mellitus with diabetic peripheral angiopathy without gangrene: Secondary | ICD-10-CM | POA: Diagnosis not present

## 2018-06-09 DIAGNOSIS — Z79899 Other long term (current) drug therapy: Secondary | ICD-10-CM

## 2018-06-09 DIAGNOSIS — Z888 Allergy status to other drugs, medicaments and biological substances status: Secondary | ICD-10-CM | POA: Diagnosis not present

## 2018-06-09 DIAGNOSIS — Z8249 Family history of ischemic heart disease and other diseases of the circulatory system: Secondary | ICD-10-CM

## 2018-06-09 DIAGNOSIS — I480 Paroxysmal atrial fibrillation: Secondary | ICD-10-CM | POA: Diagnosis not present

## 2018-06-09 HISTORY — PX: CLIPPING OF ATRIAL APPENDAGE: SHX5773

## 2018-06-09 HISTORY — PX: TEE WITHOUT CARDIOVERSION: SHX5443

## 2018-06-09 HISTORY — PX: CORONARY ARTERY BYPASS GRAFT: SHX141

## 2018-06-09 HISTORY — DX: Cerebral infarction, unspecified: I63.9

## 2018-06-09 HISTORY — PX: RADIAL ARTERY HARVEST: SHX5067

## 2018-06-09 LAB — POCT I-STAT 7, (LYTES, BLD GAS, ICA,H+H)
Acid-base deficit: 2 mmol/L (ref 0.0–2.0)
Acid-base deficit: 2 mmol/L (ref 0.0–2.0)
Acid-base deficit: 4 mmol/L — ABNORMAL HIGH (ref 0.0–2.0)
Bicarbonate: 24.8 mmol/L (ref 20.0–28.0)
Bicarbonate: 23.7 mmol/L (ref 20.0–28.0)
Bicarbonate: 21.7 mmol/L (ref 20.0–28.0)
Bicarbonate: 21.1 mmol/L (ref 20.0–28.0)
Calcium, Ion: 1.04 mmol/L — ABNORMAL LOW (ref 1.15–1.40)
Calcium, Ion: 1.14 mmol/L — ABNORMAL LOW (ref 1.15–1.40)
Calcium, Ion: 1.19 mmol/L (ref 1.15–1.40)
Calcium, Ion: 1.2 mmol/L (ref 1.15–1.40)
HCT: 25 % — ABNORMAL LOW (ref 39.0–52.0)
HCT: 25 % — ABNORMAL LOW (ref 39.0–52.0)
HCT: 27 % — ABNORMAL LOW (ref 39.0–52.0)
HCT: 26 % — ABNORMAL LOW (ref 39.0–52.0)
Hemoglobin: 8.5 g/dL — ABNORMAL LOW (ref 13.0–17.0)
Hemoglobin: 8.5 g/dL — ABNORMAL LOW (ref 13.0–17.0)
Hemoglobin: 9.2 g/dL — ABNORMAL LOW (ref 13.0–17.0)
Hemoglobin: 8.8 g/dL — ABNORMAL LOW (ref 13.0–17.0)
O2 Saturation: 100 %
O2 Saturation: 99 %
O2 Saturation: 99 %
O2 Saturation: 98 %
Patient temperature: 36.9
Patient temperature: 37.1
Potassium: 3.8 mmol/L (ref 3.5–5.1)
Potassium: 4.1 mmol/L (ref 3.5–5.1)
Potassium: 4.2 mmol/L (ref 3.5–5.1)
Potassium: 4.3 mmol/L (ref 3.5–5.1)
Sodium: 143 mmol/L (ref 135–145)
Sodium: 143 mmol/L (ref 135–145)
Sodium: 140 mmol/L (ref 135–145)
Sodium: 141 mmol/L (ref 135–145)
TCO2: 26 mmol/L (ref 22–32)
TCO2: 25 mmol/L (ref 22–32)
TCO2: 23 mmol/L (ref 22–32)
TCO2: 22 mmol/L (ref 22–32)
pCO2 arterial: 39.9 mmHg (ref 32.0–48.0)
pCO2 arterial: 41.9 mmHg (ref 32.0–48.0)
pCO2 arterial: 32.6 mmHg (ref 32.0–48.0)
pCO2 arterial: 39.4 mmHg (ref 32.0–48.0)
pH, Arterial: 7.402 (ref 7.350–7.450)
pH, Arterial: 7.36 (ref 7.350–7.450)
pH, Arterial: 7.431 (ref 7.350–7.450)
pH, Arterial: 7.338 — ABNORMAL LOW (ref 7.350–7.450)
pO2, Arterial: 445 mmHg — ABNORMAL HIGH (ref 83.0–108.0)
pO2, Arterial: 140 mmHg — ABNORMAL HIGH (ref 83.0–108.0)
pO2, Arterial: 157 mmHg — ABNORMAL HIGH (ref 83.0–108.0)
pO2, Arterial: 116 mmHg — ABNORMAL HIGH (ref 83.0–108.0)

## 2018-06-09 LAB — POCT I-STAT 4, (NA,K, GLUC, HGB,HCT)
Glucose, Bld: 130 mg/dL — ABNORMAL HIGH (ref 70–99)
Glucose, Bld: 107 mg/dL — ABNORMAL HIGH (ref 70–99)
Glucose, Bld: 118 mg/dL — ABNORMAL HIGH (ref 70–99)
Glucose, Bld: 141 mg/dL — ABNORMAL HIGH (ref 70–99)
HCT: 31 % — ABNORMAL LOW (ref 39.0–52.0)
HCT: 24 % — ABNORMAL LOW (ref 39.0–52.0)
HCT: 30 % — ABNORMAL LOW (ref 39.0–52.0)
HCT: 25 % — ABNORMAL LOW (ref 39.0–52.0)
Hemoglobin: 10.5 g/dL — ABNORMAL LOW (ref 13.0–17.0)
Hemoglobin: 10.2 g/dL — ABNORMAL LOW (ref 13.0–17.0)
Hemoglobin: 8.2 g/dL — ABNORMAL LOW (ref 13.0–17.0)
Hemoglobin: 8.5 g/dL — ABNORMAL LOW (ref 13.0–17.0)
Potassium: 4.2 mmol/L (ref 3.5–5.1)
Potassium: 3.7 mmol/L (ref 3.5–5.1)
Potassium: 3.9 mmol/L (ref 3.5–5.1)
Potassium: 4.1 mmol/L (ref 3.5–5.1)
Sodium: 142 mmol/L (ref 135–145)
Sodium: 142 mmol/L (ref 135–145)
Sodium: 143 mmol/L (ref 135–145)
Sodium: 143 mmol/L (ref 135–145)

## 2018-06-09 LAB — CBC
HCT: 35.1 % — ABNORMAL LOW (ref 39.0–52.0)
HCT: 30.5 % — ABNORMAL LOW (ref 39.0–52.0)
HCT: 30.1 % — ABNORMAL LOW (ref 39.0–52.0)
Hemoglobin: 11.6 g/dL — ABNORMAL LOW (ref 13.0–17.0)
Hemoglobin: 9.9 g/dL — ABNORMAL LOW (ref 13.0–17.0)
Hemoglobin: 9.6 g/dL — ABNORMAL LOW (ref 13.0–17.0)
MCH: 30.9 pg (ref 26.0–34.0)
MCH: 30.5 pg (ref 26.0–34.0)
MCH: 29.8 pg (ref 26.0–34.0)
MCHC: 33 g/dL (ref 30.0–36.0)
MCHC: 32.5 g/dL (ref 30.0–36.0)
MCHC: 31.9 g/dL (ref 30.0–36.0)
MCV: 93.6 fL (ref 80.0–100.0)
MCV: 93.8 fL (ref 80.0–100.0)
MCV: 93.5 fL (ref 80.0–100.0)
Platelets: 204 10*3/uL (ref 150–400)
Platelets: 127 10*3/uL — ABNORMAL LOW (ref 150–400)
Platelets: 154 10*3/uL (ref 150–400)
RBC: 3.75 MIL/uL — ABNORMAL LOW (ref 4.22–5.81)
RBC: 3.25 MIL/uL — ABNORMAL LOW (ref 4.22–5.81)
RBC: 3.22 MIL/uL — ABNORMAL LOW (ref 4.22–5.81)
RDW: 12.7 % (ref 11.5–15.5)
RDW: 12.6 % (ref 11.5–15.5)
RDW: 12.8 % (ref 11.5–15.5)
WBC: 4.8 10*3/uL (ref 4.0–10.5)
WBC: 5.7 10*3/uL (ref 4.0–10.5)
WBC: 7.5 10*3/uL (ref 4.0–10.5)
nRBC: 0 % (ref 0.0–0.2)
nRBC: 0 % (ref 0.0–0.2)
nRBC: 0 % (ref 0.0–0.2)

## 2018-06-09 LAB — URINALYSIS, ROUTINE W REFLEX MICROSCOPIC
Bacteria, UA: NONE SEEN
Bilirubin Urine: NEGATIVE
Glucose, UA: NEGATIVE mg/dL
Hgb urine dipstick: NEGATIVE
Ketones, ur: NEGATIVE mg/dL
Nitrite: NEGATIVE
Protein, ur: NEGATIVE mg/dL
Specific Gravity, Urine: 1.016 (ref 1.005–1.030)
pH: 5 (ref 5.0–8.0)

## 2018-06-09 LAB — COMPREHENSIVE METABOLIC PANEL
ALT: 37 U/L (ref 0–44)
AST: 27 U/L (ref 15–41)
Albumin: 3.7 g/dL (ref 3.5–5.0)
Alkaline Phosphatase: 73 U/L (ref 38–126)
Anion gap: 10 (ref 5–15)
BUN: 11 mg/dL (ref 8–23)
CO2: 21 mmol/L — ABNORMAL LOW (ref 22–32)
Calcium: 9.3 mg/dL (ref 8.9–10.3)
Chloride: 109 mmol/L (ref 98–111)
Creatinine, Ser: 1.3 mg/dL — ABNORMAL HIGH (ref 0.61–1.24)
GFR calc Af Amer: >60 mL/min (ref >60)
GFR calc non Af Amer: 55 mL/min — ABNORMAL LOW (ref >60)
Glucose, Bld: 111 mg/dL — ABNORMAL HIGH (ref 70–99)
Potassium: 3.5 mmol/L (ref 3.5–5.1)
Sodium: 140 mmol/L (ref 135–145)
Total Bilirubin: 1 mg/dL (ref 0.3–1.2)
Total Protein: 6.1 g/dL — ABNORMAL LOW (ref 6.5–8.1)

## 2018-06-09 LAB — APTT
aPTT: 29 seconds (ref 24–36)
aPTT: 31 seconds (ref 24–36)

## 2018-06-09 LAB — SURGICAL PCR SCREEN
MRSA, PCR: NEGATIVE
Staphylococcus aureus: NEGATIVE

## 2018-06-09 LAB — BASIC METABOLIC PANEL
Anion gap: 3 — ABNORMAL LOW (ref 5–15)
BUN: 10 mg/dL (ref 8–23)
CO2: 20 mmol/L — ABNORMAL LOW (ref 22–32)
Calcium: 7.8 mg/dL — ABNORMAL LOW (ref 8.9–10.3)
Chloride: 114 mmol/L — ABNORMAL HIGH (ref 98–111)
Creatinine, Ser: 1.1 mg/dL (ref 0.61–1.24)
GFR calc Af Amer: >60 mL/min (ref >60)
GFR calc non Af Amer: >60 mL/min (ref >60)
Glucose, Bld: 130 mg/dL — ABNORMAL HIGH (ref 70–99)
Potassium: 4.2 mmol/L (ref 3.5–5.1)
Sodium: 137 mmol/L (ref 135–145)

## 2018-06-09 LAB — HEMOGLOBIN AND HEMATOCRIT, BLOOD
HCT: 25.7 % — ABNORMAL LOW (ref 39.0–52.0)
Hemoglobin: 8.3 g/dL — ABNORMAL LOW (ref 13.0–17.0)

## 2018-06-09 LAB — GLUCOSE, CAPILLARY
Glucose-Capillary: 108 mg/dL — ABNORMAL HIGH (ref 70–99)
Glucose-Capillary: 112 mg/dL — ABNORMAL HIGH (ref 70–99)
Glucose-Capillary: 107 mg/dL — ABNORMAL HIGH (ref 70–99)
Glucose-Capillary: 107 mg/dL — ABNORMAL HIGH (ref 70–99)
Glucose-Capillary: 112 mg/dL — ABNORMAL HIGH (ref 70–99)

## 2018-06-09 LAB — PROTIME-INR
INR: 1.1 (ref 0.8–1.2)
INR: 1.5 — ABNORMAL HIGH (ref 0.8–1.2)
Prothrombin Time: 14.3 seconds (ref 11.4–15.2)
Prothrombin Time: 18.1 seconds — ABNORMAL HIGH (ref 11.4–15.2)

## 2018-06-09 LAB — ABO/RH: ABO/RH(D): O NEG

## 2018-06-09 LAB — PLATELET COUNT: Platelets: 158 10*3/uL (ref 150–400)

## 2018-06-09 LAB — PREPARE RBC (CROSSMATCH)

## 2018-06-09 LAB — MAGNESIUM: Magnesium: 2.5 mg/dL — ABNORMAL HIGH (ref 1.7–2.4)

## 2018-06-09 SURGERY — CORONARY ARTERY BYPASS GRAFTING (CABG)
Anesthesia: General | Site: Chest

## 2018-06-09 MED ORDER — ONDANSETRON HCL 4 MG/2ML IJ SOLN
4.0000 mg | Freq: Four times a day (QID) | INTRAMUSCULAR | Status: DC | PRN
  Administered 2018-06-09 – 2018-06-12 (×3): 4 mg via INTRAVENOUS
  Filled 2018-06-09 (×4): qty 2

## 2018-06-09 MED ORDER — MIDAZOLAM HCL 2 MG/2ML IJ SOLN: 2.0000 mg | INTRAMUSCULAR | Status: DC | PRN

## 2018-06-09 MED ORDER — ONDANSETRON HCL 4 MG/2ML IJ SOLN
INTRAMUSCULAR | Status: AC
  Filled 2018-06-09: qty 2

## 2018-06-09 MED ORDER — METOPROLOL TARTRATE 25 MG/10 ML ORAL SUSPENSION: 12.5000 mg | Freq: Two times a day (BID) | ORAL | Status: DC

## 2018-06-09 MED ORDER — MECLIZINE HCL 25 MG PO TABS
12.5000 mg | ORAL_TABLET | Freq: Three times a day (TID) | ORAL | Status: DC | PRN
  Administered 2018-06-10: 12.5 mg via ORAL
  Filled 2018-06-09: qty 1

## 2018-06-09 MED ORDER — CHLORHEXIDINE GLUCONATE CLOTH 2 % EX PADS
6.0000 | MEDICATED_PAD | Freq: Every day | CUTANEOUS | Status: DC
  Administered 2018-06-09 – 2018-06-10 (×2): 6 via TOPICAL

## 2018-06-09 MED ORDER — MUPIROCIN 2 % EX OINT
TOPICAL_OINTMENT | CUTANEOUS | Status: AC
  Administered 2018-06-09: 1
  Filled 2018-06-09: qty 22

## 2018-06-09 MED ORDER — ACETAMINOPHEN 160 MG/5ML PO SOLN: 650.0000 mg | Freq: Once | ORAL | Status: AC

## 2018-06-09 MED ORDER — ONDANSETRON HCL 4 MG/2ML IJ SOLN
INTRAMUSCULAR | Status: DC | PRN
  Administered 2018-06-09: 4 mg via INTRAVENOUS

## 2018-06-09 MED ORDER — MIDAZOLAM HCL 5 MG/5ML IJ SOLN
INTRAMUSCULAR | Status: DC | PRN
  Administered 2018-06-09 (×4): 2 mg via INTRAVENOUS
  Administered 2018-06-09 (×2): 1 mg via INTRAVENOUS

## 2018-06-09 MED ORDER — PROPOFOL 10 MG/ML IV BOLUS
INTRAVENOUS | Status: AC
  Filled 2018-06-09: qty 20

## 2018-06-09 MED ORDER — BISACODYL 5 MG PO TBEC
10.0000 mg | DELAYED_RELEASE_TABLET | Freq: Every day | ORAL | Status: DC
  Administered 2018-06-10 – 2018-06-12 (×3): 10 mg via ORAL
  Filled 2018-06-09 (×4): qty 2

## 2018-06-09 MED ORDER — LEVOTHYROXINE SODIUM 25 MCG PO TABS
125.0000 ug | ORAL_TABLET | Freq: Every day | ORAL | Status: DC
  Administered 2018-06-10 – 2018-06-14 (×5): 125 ug via ORAL
  Filled 2018-06-09 (×4): qty 1

## 2018-06-09 MED ORDER — SODIUM CHLORIDE 0.9% FLUSH
10.0000 mL | Freq: Two times a day (BID) | INTRAVENOUS | Status: DC
  Administered 2018-06-10 – 2018-06-11 (×2): 10 mL

## 2018-06-09 MED ORDER — METOPROLOL TARTRATE 12.5 MG HALF TABLET: 12.5000 mg | ORAL_TABLET | Freq: Once | ORAL | Status: DC

## 2018-06-09 MED ORDER — SODIUM CHLORIDE 0.9% IV SOLUTION: Freq: Once | INTRAVENOUS | Status: DC

## 2018-06-09 MED ORDER — SODIUM CHLORIDE 0.9 % IV SOLN
INTRAVENOUS | Status: DC | PRN
  Administered 2018-06-09 (×2): via INTRAVENOUS

## 2018-06-09 MED ORDER — FENOFIBRATE 160 MG PO TABS
160.0000 mg | ORAL_TABLET | Freq: Every day | ORAL | Status: DC
  Administered 2018-06-10 – 2018-06-14 (×5): 160 mg via ORAL
  Filled 2018-06-09 (×5): qty 1

## 2018-06-09 MED ORDER — ATORVASTATIN CALCIUM 40 MG PO TABS
40.0000 mg | ORAL_TABLET | Freq: Every day | ORAL | Status: DC
  Administered 2018-06-10 – 2018-06-14 (×5): 40 mg via ORAL
  Filled 2018-06-09 (×5): qty 1

## 2018-06-09 MED ORDER — ACETAMINOPHEN 650 MG RE SUPP
650.0000 mg | Freq: Once | RECTAL | Status: AC
  Administered 2018-06-09: 650 mg via RECTAL

## 2018-06-09 MED ORDER — SODIUM CHLORIDE 0.9 % IV SOLN: INTRAVENOUS | Status: DC

## 2018-06-09 MED ORDER — ROCURONIUM BROMIDE 10 MG/ML (PF) SYRINGE
PREFILLED_SYRINGE | INTRAVENOUS | Status: DC | PRN
  Administered 2018-06-09 (×4): 50 mg via INTRAVENOUS

## 2018-06-09 MED ORDER — TRAMADOL HCL 50 MG PO TABS
50.0000 mg | ORAL_TABLET | ORAL | Status: DC | PRN
  Administered 2018-06-11: 100 mg via ORAL
  Filled 2018-06-09: qty 2

## 2018-06-09 MED ORDER — NIACIN 100 MG PO TABS
100.0000 mg | ORAL_TABLET | Freq: Every day | ORAL | Status: DC
  Administered 2018-06-10 – 2018-06-12 (×3): 100 mg via ORAL
  Filled 2018-06-09 (×3): qty 1

## 2018-06-09 MED ORDER — SODIUM CHLORIDE 0.9% FLUSH
3.0000 mL | Freq: Two times a day (BID) | INTRAVENOUS | Status: DC
  Administered 2018-06-10 – 2018-06-11 (×3): 3 mL via INTRAVENOUS

## 2018-06-09 MED ORDER — METOPROLOL TARTRATE 5 MG/5ML IV SOLN
2.5000 mg | INTRAVENOUS | Status: DC | PRN
  Administered 2018-06-14 (×2): 5 mg via INTRAVENOUS
  Filled 2018-06-09 (×2): qty 5

## 2018-06-09 MED ORDER — LACTATED RINGERS IV SOLN: 500.0000 mL | Freq: Once | INTRAVENOUS | Status: DC | PRN

## 2018-06-09 MED ORDER — MAGNESIUM SULFATE 4 GM/100ML IV SOLN
4.0000 g | Freq: Once | INTRAVENOUS | Status: AC
  Administered 2018-06-09: 4 g via INTRAVENOUS
  Filled 2018-06-09: qty 100

## 2018-06-09 MED ORDER — SODIUM CHLORIDE 0.9% FLUSH: 10.0000 mL | INTRAVENOUS | Status: DC | PRN

## 2018-06-09 MED ORDER — FAMOTIDINE 20 MG IN NS 100 ML IVPB
20.0000 mg | Freq: Two times a day (BID) | INTRAVENOUS | Status: AC
  Administered 2018-06-09 (×2): 20 mg via INTRAVENOUS
  Filled 2018-06-09 (×2): qty 100

## 2018-06-09 MED ORDER — DOCUSATE SODIUM 100 MG PO CAPS
200.0000 mg | ORAL_CAPSULE | Freq: Every day | ORAL | Status: DC
  Administered 2018-06-10 – 2018-06-14 (×5): 200 mg via ORAL
  Filled 2018-06-09 (×5): qty 2

## 2018-06-09 MED ORDER — SODIUM CHLORIDE (PF) 0.9 % IJ SOLN
OROMUCOSAL | Status: DC | PRN
  Administered 2018-06-09 (×4): 4 mL via TOPICAL

## 2018-06-09 MED ORDER — ASPIRIN 81 MG PO CHEW: 324.0000 mg | CHEWABLE_TABLET | Freq: Every day | ORAL | Status: DC

## 2018-06-09 MED ORDER — METOPROLOL TARTRATE 12.5 MG HALF TABLET
12.5000 mg | ORAL_TABLET | Freq: Two times a day (BID) | ORAL | Status: DC
  Filled 2018-06-09: qty 1

## 2018-06-09 MED ORDER — PHENYLEPHRINE 40 MCG/ML (10ML) SYRINGE FOR IV PUSH (FOR BLOOD PRESSURE SUPPORT)
PREFILLED_SYRINGE | INTRAVENOUS | Status: DC | PRN
  Administered 2018-06-09: 40 ug via INTRAVENOUS

## 2018-06-09 MED ORDER — ORAL CARE MOUTH RINSE
15.0000 mL | Freq: Two times a day (BID) | OROMUCOSAL | Status: DC
  Administered 2018-06-09 – 2018-06-14 (×4): 15 mL via OROMUCOSAL

## 2018-06-09 MED ORDER — CHLORHEXIDINE GLUCONATE 0.12% ORAL RINSE (MEDLINE KIT): 15.0000 mL | Freq: Two times a day (BID) | OROMUCOSAL | Status: DC

## 2018-06-09 MED ORDER — MORPHINE SULFATE (PF) 2 MG/ML IV SOLN
1.0000 mg | INTRAVENOUS | Status: DC | PRN
  Administered 2018-06-10 (×3): 2 mg via INTRAVENOUS
  Filled 2018-06-09 (×3): qty 1

## 2018-06-09 MED ORDER — METOCLOPRAMIDE HCL 5 MG/ML IJ SOLN
10.0000 mg | Freq: Four times a day (QID) | INTRAMUSCULAR | Status: AC
  Administered 2018-06-09 – 2018-06-10 (×3): 10 mg via INTRAVENOUS
  Filled 2018-06-09 (×2): qty 2

## 2018-06-09 MED ORDER — POTASSIUM CHLORIDE 10 MEQ/50ML IV SOLN
10.0000 meq | INTRAVENOUS | Status: AC
  Administered 2018-06-09 (×3): 10 meq via INTRAVENOUS

## 2018-06-09 MED ORDER — INSULIN REGULAR(HUMAN) IN NACL 100-0.9 UT/100ML-% IV SOLN: INTRAVENOUS | Status: DC

## 2018-06-09 MED ORDER — SODIUM CHLORIDE 0.45 % IV SOLN
INTRAVENOUS | Status: DC | PRN
  Administered 2018-06-09: 14:00:00 via INTRAVENOUS

## 2018-06-09 MED ORDER — LACTATED RINGERS IV SOLN: INTRAVENOUS | Status: DC

## 2018-06-09 MED ORDER — PHENYLEPHRINE 40 MCG/ML (10ML) SYRINGE FOR IV PUSH (FOR BLOOD PRESSURE SUPPORT)
PREFILLED_SYRINGE | INTRAVENOUS | Status: AC
  Filled 2018-06-09: qty 10

## 2018-06-09 MED ORDER — SODIUM CHLORIDE 0.9 % IV SOLN
1.5000 g | Freq: Two times a day (BID) | INTRAVENOUS | Status: AC
  Administered 2018-06-09 – 2018-06-11 (×4): 1.5 g via INTRAVENOUS
  Filled 2018-06-09 (×4): qty 1.5

## 2018-06-09 MED ORDER — PANTOPRAZOLE SODIUM 40 MG PO TBEC: 40.0000 mg | DELAYED_RELEASE_TABLET | Freq: Every day | ORAL | Status: DC

## 2018-06-09 MED ORDER — INSULIN REGULAR BOLUS VIA INFUSION
0.0000 [IU] | Freq: Three times a day (TID) | INTRAVENOUS | Status: DC
  Filled 2018-06-09: qty 10

## 2018-06-09 MED ORDER — ACETAMINOPHEN 160 MG/5ML PO SOLN: 1000.0000 mg | Freq: Four times a day (QID) | ORAL | Status: DC

## 2018-06-09 MED ORDER — CHLORHEXIDINE GLUCONATE 4 % EX LIQD: 30.0000 mL | CUTANEOUS | Status: DC

## 2018-06-09 MED ORDER — HEMOSTATIC AGENTS (NO CHARGE) OPTIME
TOPICAL | Status: DC | PRN
  Administered 2018-06-09: 1 via TOPICAL

## 2018-06-09 MED ORDER — SODIUM CHLORIDE 0.9 % IV SOLN
250.0000 mL | INTRAVENOUS | Status: DC
  Administered 2018-06-10: 250 mL via INTRAVENOUS

## 2018-06-09 MED ORDER — ALBUMIN HUMAN 5 % IV SOLN
250.0000 mL | INTRAVENOUS | Status: AC | PRN
  Administered 2018-06-09: 12.5 g via INTRAVENOUS

## 2018-06-09 MED ORDER — DIAZEPAM 5 MG PO TABS: 5.0000 mg | ORAL_TABLET | Freq: Four times a day (QID) | ORAL | Status: DC | PRN

## 2018-06-09 MED ORDER — SUCCINYLCHOLINE CHLORIDE 20 MG/ML IJ SOLN
INTRAMUSCULAR | Status: DC | PRN
  Administered 2018-06-09: 120 mg via INTRAVENOUS

## 2018-06-09 MED ORDER — PANTOPRAZOLE SODIUM 40 MG PO TBEC
40.0000 mg | DELAYED_RELEASE_TABLET | Freq: Every day | ORAL | Status: DC
  Administered 2018-06-11 – 2018-06-14 (×4): 40 mg via ORAL
  Filled 2018-06-09 (×4): qty 1

## 2018-06-09 MED ORDER — 0.9 % SODIUM CHLORIDE (POUR BTL) OPTIME
TOPICAL | Status: DC | PRN
  Administered 2018-06-09: 5000 mL

## 2018-06-09 MED ORDER — SODIUM CHLORIDE 0.9% FLUSH: 3.0000 mL | INTRAVENOUS | Status: DC | PRN

## 2018-06-09 MED ORDER — NITROGLYCERIN IN D5W 200-5 MCG/ML-% IV SOLN: 0.0000 ug/min | INTRAVENOUS | Status: AC

## 2018-06-09 MED ORDER — PROPOFOL 10 MG/ML IV BOLUS
INTRAVENOUS | Status: DC | PRN
  Administered 2018-06-09: 30 mg via INTRAVENOUS
  Administered 2018-06-09: 100 mg via INTRAVENOUS
  Administered 2018-06-09: 20 mg via INTRAVENOUS

## 2018-06-09 MED ORDER — LACTATED RINGERS IV SOLN
INTRAVENOUS | Status: DC
  Administered 2018-06-11: 06:00:00 via INTRAVENOUS

## 2018-06-09 MED ORDER — PROTAMINE SULFATE 10 MG/ML IV SOLN
INTRAVENOUS | Status: AC
  Filled 2018-06-09: qty 25

## 2018-06-09 MED ORDER — ORAL CARE MOUTH RINSE
15.0000 mL | OROMUCOSAL | Status: DC
  Administered 2018-06-09: 15 mL via OROMUCOSAL

## 2018-06-09 MED ORDER — ROCURONIUM BROMIDE 50 MG/5ML IV SOSY
PREFILLED_SYRINGE | INTRAVENOUS | Status: AC
  Filled 2018-06-09: qty 20

## 2018-06-09 MED ORDER — TRANEXAMIC ACID-NACL 1000-0.7 MG/100ML-% IV SOLN
1000.0000 mg | INTRAVENOUS | Status: DC
  Filled 2018-06-09: qty 100

## 2018-06-09 MED ORDER — CHLORHEXIDINE GLUCONATE 0.12 % MT SOLN
15.0000 mL | OROMUCOSAL | Status: AC
  Administered 2018-06-09: 15 mL via OROMUCOSAL

## 2018-06-09 MED ORDER — PROTAMINE SULFATE 10 MG/ML IV SOLN
INTRAVENOUS | Status: DC | PRN
  Administered 2018-06-09: 350 mg via INTRAVENOUS

## 2018-06-09 MED ORDER — HEPARIN SODIUM (PORCINE) 1000 UNIT/ML IJ SOLN
INTRAMUSCULAR | Status: DC | PRN
  Administered 2018-06-09: 32000 [IU] via INTRAVENOUS
  Administered 2018-06-09: 3000 [IU] via INTRAVENOUS

## 2018-06-09 MED ORDER — ASPIRIN EC 325 MG PO TBEC
325.0000 mg | DELAYED_RELEASE_TABLET | Freq: Every day | ORAL | Status: DC
  Administered 2018-06-10 – 2018-06-14 (×5): 325 mg via ORAL
  Filled 2018-06-09 (×5): qty 1

## 2018-06-09 MED ORDER — VANCOMYCIN HCL IN DEXTROSE 1-5 GM/200ML-% IV SOLN
1000.0000 mg | Freq: Once | INTRAVENOUS | Status: AC
  Administered 2018-06-09: 1000 mg via INTRAVENOUS
  Filled 2018-06-09: qty 200

## 2018-06-09 MED ORDER — DEXMEDETOMIDINE HCL IN NACL 200 MCG/50ML IV SOLN: 0.0000 ug/kg/h | INTRAVENOUS | Status: DC

## 2018-06-09 MED ORDER — FENTANYL CITRATE (PF) 250 MCG/5ML IJ SOLN
INTRAMUSCULAR | Status: AC
  Filled 2018-06-09: qty 5

## 2018-06-09 MED ORDER — LACTATED RINGERS IV SOLN
INTRAVENOUS | Status: DC | PRN
  Administered 2018-06-09: 07:00:00 via INTRAVENOUS

## 2018-06-09 MED ORDER — CHLORHEXIDINE GLUCONATE 0.12 % MT SOLN
15.0000 mL | Freq: Once | OROMUCOSAL | Status: AC
  Administered 2018-06-09: 15 mL via OROMUCOSAL
  Filled 2018-06-09: qty 15

## 2018-06-09 MED ORDER — FENTANYL CITRATE (PF) 250 MCG/5ML IJ SOLN
INTRAMUSCULAR | Status: AC
  Filled 2018-06-09: qty 20

## 2018-06-09 MED ORDER — PHENYLEPHRINE HCL-NACL 20-0.9 MG/250ML-% IV SOLN: 0.0000 ug/min | INTRAVENOUS | Status: DC

## 2018-06-09 MED ORDER — PROTAMINE SULFATE 10 MG/ML IV SOLN
INTRAVENOUS | Status: AC
  Filled 2018-06-09: qty 10

## 2018-06-09 MED ORDER — PROMETHAZINE HCL 25 MG/ML IJ SOLN
12.5000 mg | Freq: Four times a day (QID) | INTRAMUSCULAR | Status: DC | PRN
  Administered 2018-06-09 – 2018-06-11 (×2): 12.5 mg via INTRAVENOUS
  Filled 2018-06-09 (×2): qty 1

## 2018-06-09 MED ORDER — MIDAZOLAM HCL (PF) 10 MG/2ML IJ SOLN
INTRAMUSCULAR | Status: AC
  Filled 2018-06-09: qty 2

## 2018-06-09 MED ORDER — ALBUMIN HUMAN 5 % IV SOLN
INTRAVENOUS | Status: DC | PRN
  Administered 2018-06-09: 12:00:00 via INTRAVENOUS

## 2018-06-09 MED ORDER — FENTANYL CITRATE (PF) 250 MCG/5ML IJ SOLN
INTRAMUSCULAR | Status: DC | PRN
  Administered 2018-06-09: 150 ug via INTRAVENOUS
  Administered 2018-06-09: 250 ug via INTRAVENOUS
  Administered 2018-06-09 (×5): 100 ug via INTRAVENOUS
  Administered 2018-06-09: 50 ug via INTRAVENOUS
  Administered 2018-06-09: 100 ug via INTRAVENOUS
  Administered 2018-06-09: 200 ug via INTRAVENOUS

## 2018-06-09 MED ORDER — BISACODYL 10 MG RE SUPP: 10.0000 mg | Freq: Every day | RECTAL | Status: DC

## 2018-06-09 MED ORDER — NITROGLYCERIN 0.2 MG/ML ON CALL CATH LAB
INTRAVENOUS | Status: DC | PRN
  Administered 2018-06-09: 40 ug via INTRAVENOUS

## 2018-06-09 MED ORDER — ACETAMINOPHEN 500 MG PO TABS
1000.0000 mg | ORAL_TABLET | Freq: Four times a day (QID) | ORAL | Status: DC
  Administered 2018-06-10 – 2018-06-14 (×14): 1000 mg via ORAL
  Filled 2018-06-09 (×16): qty 2

## 2018-06-09 MED ORDER — DEXMEDETOMIDINE HCL IN NACL 200 MCG/50ML IV SOLN
INTRAVENOUS | Status: AC
  Filled 2018-06-09: qty 50

## 2018-06-09 MED ORDER — PLASMA-LYTE 148 IV SOLN
INTRAVENOUS | Status: DC | PRN
  Administered 2018-06-09: 500 mL via INTRAVASCULAR

## 2018-06-09 SURGICAL SUPPLY — 98 items
APPLIER CLIP 9.375 SM OPEN (CLIP)
APR CLP SM 9.3 20 MLT OPN (CLIP)
ATRICLIP EXCLUSION 35 STD HAND (Clip) ×4 IMPLANT
BAG DECANTER FOR FLEXI CONT (MISCELLANEOUS) ×4 IMPLANT
BANDAGE ACE 4X5 VEL STRL LF (GAUZE/BANDAGES/DRESSINGS) ×4 IMPLANT
BANDAGE ACE 6X5 VEL STRL LF (GAUZE/BANDAGES/DRESSINGS) IMPLANT
BASKET HEART (ORDER IN 25'S) (MISCELLANEOUS) ×1
BASKET HEART (ORDER IN 25S) (MISCELLANEOUS) ×3 IMPLANT
BATTERY MAXDRIVER (MISCELLANEOUS) ×8 IMPLANT
BLADE STERNUM SYSTEM 6 (BLADE) ×4 IMPLANT
BLADE SURG 15 STRL LF DISP TIS (BLADE) ×3 IMPLANT
BLADE SURG 15 STRL SS (BLADE) ×3
BNDG GAUZE ELAST 4 BULKY (GAUZE/BANDAGES/DRESSINGS) ×4 IMPLANT
CANISTER SUCT 3000ML PPV (MISCELLANEOUS) ×4 IMPLANT
CANNULA EZ GLIDE AORTIC 21FR (CANNULA) ×4 IMPLANT
CATH CPB KIT HENDRICKSON (MISCELLANEOUS) ×4 IMPLANT
CATH ROBINSON RED A/P 18FR (CATHETERS) ×4 IMPLANT
CATH THORACIC 36FR (CATHETERS) ×4 IMPLANT
CATH THORACIC 36FR RT ANG (CATHETERS) ×4 IMPLANT
CLAMP OLL ABLATION (MISCELLANEOUS) ×4 IMPLANT
CLIP APPLIE 9.375 SM OPEN (CLIP) IMPLANT
CLIP VESOCCLUDE MED 24/CT (CLIP) IMPLANT
CLIP VESOCCLUDE SM WIDE 24/CT (CLIP) IMPLANT
COVER MAYO STAND STRL (DRAPES) IMPLANT
COVER WAND RF STERILE (DRAPES) IMPLANT
CRADLE DONUT ADULT HEAD (MISCELLANEOUS) ×4 IMPLANT
DRAPE CARDIOVASCULAR INCISE (DRAPES) ×1
DRAPE HALF SHEET 40X57 (DRAPES) ×4 IMPLANT
DRAPE SLUSH/WARMER DISC (DRAPES) ×4 IMPLANT
DRAPE SRG 135X102X78XABS (DRAPES) ×3 IMPLANT
DRSG AQUACEL AG ADV 3.5X14 (GAUZE/BANDAGES/DRESSINGS) ×4 IMPLANT
DRSG COVADERM 4X14 (GAUZE/BANDAGES/DRESSINGS) ×4 IMPLANT
ELECT REM PT RETURN 9FT ADLT (ELECTROSURGICAL) ×8
ELECTRODE REM PT RTRN 9FT ADLT (ELECTROSURGICAL) ×6 IMPLANT
FELT TEFLON 1X6 (MISCELLANEOUS) ×8 IMPLANT
GAUZE SPONGE 4X4 12PLY STRL (GAUZE/BANDAGES/DRESSINGS) ×8 IMPLANT
GEL ULTRASOUND 20GR AQUASONIC (MISCELLANEOUS) ×4 IMPLANT
GLOVE SURG SIGNA 7.5 PF LTX (GLOVE) ×12 IMPLANT
GOWN STRL REUS W/ TWL LRG LVL3 (GOWN DISPOSABLE) ×18 IMPLANT
GOWN STRL REUS W/ TWL XL LVL3 (GOWN DISPOSABLE) ×6 IMPLANT
GOWN STRL REUS W/TWL LRG LVL3 (GOWN DISPOSABLE) ×6
GOWN STRL REUS W/TWL XL LVL3 (GOWN DISPOSABLE) ×4
HARMONIC SHEARS 14CM COAG (MISCELLANEOUS) ×4 IMPLANT
HEMOSTAT POWDER SURGIFOAM 1G (HEMOSTASIS) ×16 IMPLANT
HEMOSTAT SURGICEL 2X14 (HEMOSTASIS) ×4 IMPLANT
INSERT FOGARTY XLG (MISCELLANEOUS) IMPLANT
KIT BASIN OR (CUSTOM PROCEDURE TRAY) ×4 IMPLANT
KIT SUCTION CATH 14FR (SUCTIONS) ×8 IMPLANT
KIT TURNOVER KIT B (KITS) ×4 IMPLANT
KIT VASOVIEW HEMOPRO 2 VH 4000 (KITS) IMPLANT
MARKER GRAFT CORONARY BYPASS (MISCELLANEOUS) ×4 IMPLANT
NS IRRIG 1000ML POUR BTL (IV SOLUTION) ×20 IMPLANT
PACK E OPEN HEART (SUTURE) ×4 IMPLANT
PACK OPEN HEART (CUSTOM PROCEDURE TRAY) ×4 IMPLANT
PAD ARMBOARD 7.5X6 YLW CONV (MISCELLANEOUS) ×8 IMPLANT
PAD ELECT DEFIB RADIOL ZOLL (MISCELLANEOUS) ×4 IMPLANT
PENCIL BUTTON HOLSTER BLD 10FT (ELECTRODE) ×4 IMPLANT
PLATE STERNAL 2.3X208 14H 2-PK (Plate) ×4 IMPLANT
PUNCH AORTIC ROTATE 4.0MM (MISCELLANEOUS) ×4 IMPLANT
PUNCH AORTIC ROTATE 4.5MM 8IN (MISCELLANEOUS) IMPLANT
PUNCH AORTIC ROTATE 5MM 8IN (MISCELLANEOUS) IMPLANT
SCREW LOCKING TI 2.3X11MM (Screw) ×8 IMPLANT
SCREW LOCKING TI 2.3X13MM (Screw) ×64 IMPLANT
SCREW STERNAL LOCK 2.3MM (Screw) ×24 IMPLANT
SET CARDIOPLEGIA MPS 5001102 (MISCELLANEOUS) ×4 IMPLANT
SHEARS HARMONIC 9CM CVD (BLADE) ×4 IMPLANT
SUT BONE WAX W31G (SUTURE) ×4 IMPLANT
SUT ETHIBOND NAB MH 2-0 36IN (SUTURE) ×4 IMPLANT
SUT MNCRL AB 4-0 PS2 18 (SUTURE) ×4 IMPLANT
SUT PROLENE 3 0 SH DA (SUTURE) ×4 IMPLANT
SUT PROLENE 4 0 RB 1 (SUTURE) ×2
SUT PROLENE 4 0 SH DA (SUTURE) IMPLANT
SUT PROLENE 4-0 RB1 .5 CRCL 36 (SUTURE) ×6 IMPLANT
SUT PROLENE 6 0 C 1 30 (SUTURE) ×20 IMPLANT
SUT PROLENE 7 0 BV 1 (SUTURE) ×4 IMPLANT
SUT PROLENE 7 0 BV1 MDA (SUTURE) ×4 IMPLANT
SUT PROLENE 8 0 BV175 6 (SUTURE) IMPLANT
SUT STEEL 6MS V (SUTURE) ×8 IMPLANT
SUT STEEL STERNAL CCS#1 18IN (SUTURE) IMPLANT
SUT STEEL SZ 6 DBL 3X14 BALL (SUTURE) ×8 IMPLANT
SUT VIC AB 1 CTX 36 (SUTURE) ×4
SUT VIC AB 1 CTX36XBRD ANBCTR (SUTURE) ×6 IMPLANT
SUT VIC AB 2-0 CT1 27 (SUTURE) ×1
SUT VIC AB 2-0 CT1 TAPERPNT 27 (SUTURE) ×3 IMPLANT
SUT VIC AB 2-0 CTX 27 (SUTURE) IMPLANT
SUT VIC AB 3-0 SH 27 (SUTURE)
SUT VIC AB 3-0 SH 27X BRD (SUTURE) IMPLANT
SUT VIC AB 3-0 X1 27 (SUTURE) IMPLANT
SUT VICRYL 4-0 PS2 18IN ABS (SUTURE) IMPLANT
SYR 50ML SLIP (SYRINGE) IMPLANT
SYSTEM SAHARA CHEST DRAIN ATS (WOUND CARE) ×4 IMPLANT
TAPE CLOTH SURG 4X10 WHT LF (GAUZE/BANDAGES/DRESSINGS) ×4 IMPLANT
TOWEL GREEN STERILE (TOWEL DISPOSABLE) ×4 IMPLANT
TOWEL GREEN STERILE FF (TOWEL DISPOSABLE) ×4 IMPLANT
TRAY FOLEY SLVR 16FR TEMP STAT (SET/KITS/TRAYS/PACK) ×4 IMPLANT
TUBING LAP HI FLOW INSUFFLATIO (TUBING) IMPLANT
UNDERPAD 30X30 (UNDERPADS AND DIAPERS) ×8 IMPLANT
WATER STERILE IRR 1000ML POUR (IV SOLUTION) ×8 IMPLANT

## 2018-06-09 NOTE — Transfer of Care (Signed)
Immediate Anesthesia Transfer of Care Note  Patient: Patrick Duran  Procedure(s) Performed: CORONARY ARTERY BYPASS GRAFTING (CABG) x one using radial artery (N/A Chest) CLIPPING OF ATRIAL APPENDAGE using AtriCure Clip size 35 (N/A ) RADIAL ARTERY HARVEST (Left ) TRANSESOPHAGEAL ECHOCARDIOGRAM (TEE) (N/A )  Patient Location: SICU  Anesthesia Type:General  Level of Consciousness: Patient remains intubated per anesthesia plan  Airway & Oxygen Therapy: Patient remains intubated per anesthesia plan and Patient placed on Ventilator (see vital sign flow sheet for setting)  Post-op Assessment: Report given to RN and Post -op Vital signs reviewed and stable  Post vital signs: Reviewed and stable  Last Vitals:  Vitals Value Taken Time  BP    Temp    Pulse    Resp    SpO2      Last Pain:  Vitals:   06/09/18 0617  TempSrc: Oral         Complications: No apparent anesthesia complications

## 2018-06-09 NOTE — Anesthesia Postprocedure Evaluation (Signed)
Anesthesia Post Note  Patient: Patrick Duran  Procedure(s) Performed: CORONARY ARTERY BYPASS GRAFTING (CABG) x one using radial artery (N/A Chest) CLIPPING OF ATRIAL APPENDAGE using AtriCure Clip size 35 (N/A ) RADIAL ARTERY HARVEST (Left ) TRANSESOPHAGEAL ECHOCARDIOGRAM (TEE) (N/A )     Patient location during evaluation: SICU Anesthesia Type: General Level of consciousness: sedated Pain management: pain level controlled Vital Signs Assessment: post-procedure vital signs reviewed and stable Respiratory status: patient remains intubated per anesthesia plan Cardiovascular status: blood pressure returned to baseline and stable Postop Assessment: no apparent nausea or vomiting Anesthetic complications: no    Last Vitals:  Vitals:   06/09/18 0617  BP: 134/80  Pulse: (!) 53  Resp: 16  Temp: (!) 36.4 C  SpO2: 95%    Last Pain:  Vitals:   06/09/18 0617  TempSrc: Oral                 Kennieth Rad

## 2018-06-09 NOTE — Anesthesia Procedure Notes (Addendum)
Central Venous Catheter Insertion Performed by: Lewie Loron, MD, anesthesiologist Start/End3/13/2020 6:50 AM, 06/09/2018 7:00 AM Patient location: Pre-op. Preanesthetic checklist: patient identified, IV checked, site marked, risks and benefits discussed, surgical consent, monitors and equipment checked, pre-op evaluation, timeout performed and anesthesia consent Lidocaine 1% used for infiltration and patient sedated Hand hygiene performed  and maximum sterile barriers used  Catheter size: 8.5 Fr Sheath introducer Procedure performed using ultrasound guided technique. Ultrasound Notes:anatomy identified, needle tip was noted to be adjacent to the nerve/plexus identified, no ultrasound evidence of intravascular and/or intraneural injection and image(s) printed for medical record Attempts: 1 Following insertion, line sutured, dressing applied and Biopatch. Post procedure assessment: blood return through all ports, free fluid flow and no air  Patient tolerated the procedure well with no immediate complications.

## 2018-06-09 NOTE — Anesthesia Procedure Notes (Signed)
Central Venous Catheter Insertion Performed by: Lewie Loron, MD, anesthesiologist Start/End3/13/2020 7:00 AM, 06/09/2018 7:03 AM Patient location: Pre-op. Preanesthetic checklist: patient identified, IV checked, site marked, risks and benefits discussed, surgical consent, monitors and equipment checked, pre-op evaluation, timeout performed and anesthesia consent Hand hygiene performed  and maximum sterile barriers used  PA cath was placed.Swan type:thermodilution PA Cath depth:42 Procedure performed without using ultrasound guided technique. Attempts: 1 Patient tolerated the procedure well with no immediate complications.

## 2018-06-09 NOTE — Progress Notes (Signed)
NIF -20. VC 850. Good patient effort.

## 2018-06-09 NOTE — Progress Notes (Signed)
      [address]       [place] [address]             [phone]      S/p CABG, PV isolation, LA clip  Extubated, c/o nausea BP 108/69   Pulse 75   Temp 98.6 F (37 C)   Resp 11   Ht 6\' 1"  (1.854 m)   Wt 104 kg   SpO2 100%   BMI 30.25 kg/m   Intake/Output Summary (Last 24 hours) at 06/09/2018 1836 Last data filed at 06/09/2018 1800 Gross per 24 hour  Intake 2494.71 ml  Output 1877 ml  Net 617.71 ml   CO= 6 (2.6 index) Hct= 30 K= 4.1 Left hand neuro intact, cap refill 3 sec index finger  Doing well early postop  [name], MD [facility] [phone]

## 2018-06-09 NOTE — Interval H&P Note (Signed)
History and Physical Interval Note: Nausea after sedation given this AM Allen's test on left normal with pulse ox, will confirm in OR with doppler 06/09/2018 7:31 AM  Patrick Duran  has presented today for surgery, with the diagnosis of CAD AFIB.  The various methods of treatment have been discussed with the patient and family. After consideration of risks, benefits and other options for treatment, the patient has consented to  Procedure(s): CORONARY ARTERY BYPASS GRAFTING (CABG) (N/A) CLIPPING OF ATRIAL APPENDAGE (N/A) RADIAL ARTERY HARVEST (Left) TRANSESOPHAGEAL ECHOCARDIOGRAM (TEE) (N/A) as a surgical intervention.  The patient's history has been reviewed, patient examined, no change in status, stable for surgery.  I have reviewed the patient's chart and labs.  Questions were answered to the patient's satisfaction.     Loreli Slot

## 2018-06-09 NOTE — Progress Notes (Signed)
RT note: rapid wean protocol initiated at 1630.  

## 2018-06-09 NOTE — Anesthesia Procedure Notes (Addendum)
Arterial Line Insertion Start/End3/13/2020 7:05 AM, 06/09/2018 7:08 AM Performed by: Lewie Loron, MD, anesthesiologist  Patient location: Pre-op. Preanesthetic checklist: patient identified, IV checked, site marked, risks and benefits discussed, surgical consent, monitors and equipment checked, pre-op evaluation, timeout performed and anesthesia consent Lidocaine 1% used for infiltration and patient sedated Right, radial was placed Catheter size: 20 G Hand hygiene performed  and maximum sterile barriers used  Allen's test indicative of satisfactory collateral circulation Attempts: 2 Procedure performed using ultrasound guided technique. Ultrasound Notes:anatomy identified, needle tip was noted to be adjacent to the nerve/plexus identified, no ultrasound evidence of intravascular and/or intraneural injection and image(s) printed for medical record Following insertion, dressing applied and Biopatch. Post procedure assessment: normal  Patient tolerated the procedure well with no immediate complications.

## 2018-06-09 NOTE — Brief Op Note (Signed)
06/09/2018  5:47 PM  PATIENT:  Patrick Duran  73 y.o. male  PRE-OPERATIVE DIAGNOSIS:  Coronary Artery Disease, Atrial Fibrillation  POST-OPERATIVE DIAGNOSIS:  Coronary Artery Disease, Atrial Fibrillation  PROCEDURE:  Procedure(s) : CORONARY ARTERY BYPASS GRAFTING x 1 -Radial Artery to RCA  MODIFIED MAZE PROCEDURE -Left and Right Pulmonary Vein Isolation with Radiofrequency Ablation - Ablation to LA Appendage  CLIPPING OF ATRIAL APPENDAGE  -Atricure size 35 mm Pro Clip  OPEN LEFT RADIAL HARVEST  TRANSESOPHAGEAL ECHOCARDIOGRAM (TEE) (N/A)  SURGEON:  Surgeon(s) and Role:    Loreli Slot, MD - Primary  PHYSICIAN ASSISTANT: [name] PA-C  ANESTHESIA:   general  EBL:  950 mL   BLOOD ADMINISTERED:CELLSAVER  DRAINS: Mediastinal Chest Drains   LOCAL MEDICATIONS USED:  NONE  SPECIMEN:  No Specimen  DISPOSITION OF SPECIMEN:  N/A  COUNTS:  YES  TOURNIQUET:  * No tourniquets in log *  DICTATION: .Dragon Dictation  PLAN OF CARE: Admit to inpatient   PATIENT DISPOSITION:  ICU - intubated and hemodynamically stable.   Delay start of Pharmacological VTE agent (>24hrs) due to surgical blood loss or risk of bleeding: yes

## 2018-06-09 NOTE — Procedures (Signed)
Extubation Procedure Note  Patient Details:   Name: DAWAUN PASE DOB: 1945/09/25 MRN: [id number]   Airway Documentation:    Vent end date: (not recorded) Vent end time: (not recorded)   Evaluation  O2 sats: stable throughout Complications: No apparent complications Patient did tolerate procedure well. Bilateral Breath Sounds: Clear, Diminished   Yes   Patient extubated per weaning parameters at 1747. Patient placed on 4L nasal cannula. Vitals stable and patient able to vocalize. RT will continue to monitor.   Farris Has 06/09/2018, 5:51 PM

## 2018-06-09 NOTE — Anesthesia Procedure Notes (Signed)
Procedure Name: Intubation Date/Time: 06/09/2018 8:04 AM Performed by: Inda Coke, CRNA Pre-anesthesia Checklist: Patient identified, Emergency Drugs available, Suction available and Patient being monitored Patient Re-evaluated:Patient Re-evaluated prior to induction Oxygen Delivery Method: Circle System Utilized Preoxygenation: Pre-oxygenation with 100% oxygen Induction Type: IV induction, Rapid sequence and Cricoid Pressure applied Ventilation: Mask ventilation without difficulty Laryngoscope Size: Glidescope and 4 Grade View: Grade I Tube type: Oral Tube size: 8.0 mm Number of attempts: 1 Airway Equipment and Method: Stylet and Oral airway Placement Confirmation: ETT inserted through vocal cords under direct vision,  positive ETCO2 and breath sounds checked- equal and bilateral Secured at: 23 cm Tube secured with: Tape Dental Injury: Teeth and Oropharynx as per pre-operative assessment and Injury to lip  Difficulty Due To: Difficult Airway- due to anterior larynx Comments: DL X 1 with MAC 4 blade. Grade 3 view. DL X 2 with glidescope Mac 4 blade and grade I view with successful intubation

## 2018-06-10 ENCOUNTER — Inpatient Hospital Stay (HOSPITAL_COMMUNITY): Payer: HMO

## 2018-06-10 LAB — GLUCOSE, CAPILLARY
Glucose-Capillary: 108 mg/dL — ABNORMAL HIGH (ref 70–99)
Glucose-Capillary: 109 mg/dL — ABNORMAL HIGH (ref 70–99)
Glucose-Capillary: 110 mg/dL — ABNORMAL HIGH (ref 70–99)
Glucose-Capillary: 114 mg/dL — ABNORMAL HIGH (ref 70–99)
Glucose-Capillary: 118 mg/dL — ABNORMAL HIGH (ref 70–99)
Glucose-Capillary: 124 mg/dL — ABNORMAL HIGH (ref 70–99)
Glucose-Capillary: 126 mg/dL — ABNORMAL HIGH (ref 70–99)
Glucose-Capillary: 127 mg/dL — ABNORMAL HIGH (ref 70–99)
Glucose-Capillary: 130 mg/dL — ABNORMAL HIGH (ref 70–99)
Glucose-Capillary: 130 mg/dL — ABNORMAL HIGH (ref 70–99)
Glucose-Capillary: 134 mg/dL — ABNORMAL HIGH (ref 70–99)
Glucose-Capillary: 137 mg/dL — ABNORMAL HIGH (ref 70–99)
Glucose-Capillary: 137 mg/dL — ABNORMAL HIGH (ref 70–99)
Glucose-Capillary: 138 mg/dL — ABNORMAL HIGH (ref 70–99)
Glucose-Capillary: 140 mg/dL — ABNORMAL HIGH (ref 70–99)
Glucose-Capillary: 141 mg/dL — ABNORMAL HIGH (ref 70–99)
Glucose-Capillary: 157 mg/dL — ABNORMAL HIGH (ref 70–99)
Glucose-Capillary: 163 mg/dL — ABNORMAL HIGH (ref 70–99)
Glucose-Capillary: 163 mg/dL — ABNORMAL HIGH (ref 70–99)
Glucose-Capillary: 89 mg/dL (ref 70–99)
Glucose-Capillary: 99 mg/dL (ref 70–99)

## 2018-06-10 LAB — CBC
HCT: 29.6 % — ABNORMAL LOW (ref 39.0–52.0)
HCT: 29.3 % — ABNORMAL LOW (ref 39.0–52.0)
Hemoglobin: 9.9 g/dL — ABNORMAL LOW (ref 13.0–17.0)
Hemoglobin: 9.3 g/dL — ABNORMAL LOW (ref 13.0–17.0)
MCH: 31.4 pg (ref 26.0–34.0)
MCH: 30.4 pg (ref 26.0–34.0)
MCHC: 33.4 g/dL (ref 30.0–36.0)
MCHC: 31.7 g/dL (ref 30.0–36.0)
MCV: 94 fL (ref 80.0–100.0)
MCV: 95.8 fL (ref 80.0–100.0)
Platelets: 181 10*3/uL (ref 150–400)
Platelets: 166 10*3/uL (ref 150–400)
RBC: 3.15 MIL/uL — ABNORMAL LOW (ref 4.22–5.81)
RBC: 3.06 MIL/uL — ABNORMAL LOW (ref 4.22–5.81)
RDW: 13.2 % (ref 11.5–15.5)
RDW: 13.4 % (ref 11.5–15.5)
WBC: 9.1 10*3/uL (ref 4.0–10.5)
WBC: 8 10*3/uL (ref 4.0–10.5)
nRBC: 0 % (ref 0.0–0.2)
nRBC: 0 % (ref 0.0–0.2)

## 2018-06-10 LAB — BASIC METABOLIC PANEL
Anion gap: 8 (ref 5–15)
Anion gap: 4 — ABNORMAL LOW (ref 5–15)
BUN: 11 mg/dL (ref 8–23)
BUN: 17 mg/dL (ref 8–23)
CO2: 19 mmol/L — ABNORMAL LOW (ref 22–32)
CO2: 22 mmol/L (ref 22–32)
Calcium: 8.2 mg/dL — ABNORMAL LOW (ref 8.9–10.3)
Calcium: 8.1 mg/dL — ABNORMAL LOW (ref 8.9–10.3)
Chloride: 109 mmol/L (ref 98–111)
Chloride: 106 mmol/L (ref 98–111)
Creatinine, Ser: 1.17 mg/dL (ref 0.61–1.24)
Creatinine, Ser: 1.55 mg/dL — ABNORMAL HIGH (ref 0.61–1.24)
GFR calc Af Amer: >60 mL/min (ref >60)
GFR calc Af Amer: 51 mL/min — ABNORMAL LOW (ref >60)
GFR calc non Af Amer: >60 mL/min (ref >60)
GFR calc non Af Amer: 44 mL/min — ABNORMAL LOW (ref >60)
Glucose, Bld: 132 mg/dL — ABNORMAL HIGH (ref 70–99)
Glucose, Bld: 179 mg/dL — ABNORMAL HIGH (ref 70–99)
Potassium: 4.2 mmol/L (ref 3.5–5.1)
Potassium: 4.2 mmol/L (ref 3.5–5.1)
Sodium: 136 mmol/L (ref 135–145)
Sodium: 132 mmol/L — ABNORMAL LOW (ref 135–145)

## 2018-06-10 LAB — MAGNESIUM
Magnesium: 2.1 mg/dL (ref 1.7–2.4)
Magnesium: 2.4 mg/dL (ref 1.7–2.4)

## 2018-06-10 MED ORDER — INSULIN ASPART 100 UNIT/ML ~~LOC~~ SOLN: 0.0000 [IU] | SUBCUTANEOUS | Status: DC

## 2018-06-10 MED ORDER — INSULIN ASPART 100 UNIT/ML ~~LOC~~ SOLN
0.0000 [IU] | Freq: Three times a day (TID) | SUBCUTANEOUS | Status: DC
  Administered 2018-06-10: 4 [IU] via SUBCUTANEOUS

## 2018-06-10 MED ORDER — ENOXAPARIN SODIUM 40 MG/0.4ML ~~LOC~~ SOLN
40.0000 mg | Freq: Every day | SUBCUTANEOUS | Status: DC
  Administered 2018-06-10: 40 mg via SUBCUTANEOUS
  Filled 2018-06-10: qty 0.4

## 2018-06-10 MED ORDER — METOPROLOL TARTRATE 25 MG PO TABS
25.0000 mg | ORAL_TABLET | Freq: Two times a day (BID) | ORAL | Status: DC
  Administered 2018-06-10 – 2018-06-14 (×8): 25 mg via ORAL
  Filled 2018-06-10 (×9): qty 1

## 2018-06-10 MED ORDER — INSULIN ASPART 100 UNIT/ML ~~LOC~~ SOLN
3.0000 [IU] | Freq: Three times a day (TID) | SUBCUTANEOUS | Status: DC
  Administered 2018-06-10 – 2018-06-11 (×4): 3 [IU] via SUBCUTANEOUS

## 2018-06-10 MED ORDER — INSULIN DETEMIR 100 UNIT/ML ~~LOC~~ SOLN
20.0000 [IU] | Freq: Once | SUBCUTANEOUS | Status: AC
  Administered 2018-06-10: 20 [IU] via SUBCUTANEOUS
  Filled 2018-06-10: qty 0.2

## 2018-06-10 MED ORDER — ISOSORBIDE MONONITRATE ER 30 MG PO TB24
30.0000 mg | ORAL_TABLET | Freq: Every day | ORAL | Status: DC
  Administered 2018-06-10 – 2018-06-14 (×5): 30 mg via ORAL
  Filled 2018-06-10 (×5): qty 1

## 2018-06-10 NOTE — Progress Notes (Signed)
Dangled on side of bed prior to chest tube removal. Patient c/o diziness with sitting up and movement, BP stable, Heart rate stable.  Patient does get this way at home. Medicated with antivert. Bilateral grips equal, right side face smile slight flat, patient known to have this prior had a "mini stroke 10 years ago according to wife. . Patient unable to stand without feeling dizziy.  CT taken out without incident. Dressings applied after pacer wires diconnected from pacer and taped for protection. Patient given rest breaks in between  Eyes closed in between prcedures. Encouraged deep breathing use of IS

## 2018-06-10 NOTE — Op Note (Signed)
NAME: FURNELL, HUGHART MEDICAL RECORD TO:[id number] ACCOUNT [id number] DATE OF BIRTH:1945/11/14 FACILITY: [facility] LOCATION: [facility] PHYSICIAN:Talia Hoheisel Lars Pinks, MD  OPERATIVE REPORT  DATE OF PROCEDURE:  06/09/2018  PREOPERATIVE DIAGNOSES:  Single-vessel coronary disease and paroxysmal atrial fibrillation.  POSTOPERATIVE DIAGNOSIS:  Single-vessel coronary disease and paroxysmal atrial fibrillation.  PROCEDURE: 1.  Median sternotomy, extracorporeal circulation, Coronary artery bypass grafting x1 with left radial artery to right coronary artery. 2.  Left and right pulmonary vein isolation with radiofrequency ablation.  3.  Radiofrequency ablation of left atrial appendage.   4.  Left atrial clip (35 mm AtriCure PRO clip).  SURGEON:  Charlett Lango, MD  ASSISTANT:  [name], PA-C  ANESTHESIA:  General.  FINDINGS:  Transesophageal echocardiography revealed preserved left ventricular function, no significant valvular pathology. Radial artery good quality. Normal Allen test with good Doppler signal in palmar arch with radial occlusion.  CLINICAL NOTE:  Mr. Maben is a 73 year old gentleman with known coronary artery disease with multiple previous percutaneous interventions to his right coronary.  He presented with an unstable coronary syndrome, and at catheterization had significant  in-stent restenosis in the right coronary.  He was referred for coronary artery bypass grafting as percutaneous options were no longer advisable.  He also had a history of paroxysmal atrial fibrillation and has been having frequent palpitations.  He was  offered coronary artery bypass grafting x1 to the right coronary as well as pulmonary vein and left atrial appendage isolation and left atrial appendage clipping.  The indications, risks, benefits, and alternatives were discussed in detail with the  patient.  He understood and accepted the risks and agreed to proceed.  OPERATIVE NOTE:  The patient  was brought to the preoperative holding area on 06/09/2018.  Anesthesia placed a Swan-Ganz catheter and an arterial blood pressure monitoring line.  The Allen's test was performed with a pulse ox on the index finger, and the signal  was maintained with radial artery occlusion.  He was taken to the operating room, anesthetized and intubated.  Transesophageal echocardiography was performed.  There was normal left ventricular function with no significant valvular pathology.  The  chest, abdomen, legs and left arm were prepped and draped in the usual sterile fashion.  Prior to prepping and draping the left arm, the Allen's test was repeated using a Doppler on the palmar arch, and there was a slight decrease but no loss of signal  with radial artery occlusion.  A timeout was performed.  An incision was made over the volar aspect of the left wrist.  A short segment of the radial artery was dissected out.  With proximal occlusion, there was good pulse distally and the palmar arch signal was maintained.  The  incision then was extended to just below the antecubital fossa, and the radial artery was harvested using the Harmonic scalpel.  Two thousand units of heparin was administered prior to dividing the distal end of the radial artery.  There was excellent  flow through the graft.  The proximal and distal ends of the radial were suture ligated.  The wound was copiously irrigated with warm saline and closed in standard fashion.  As the radial incision was being closed, a median sternotomy was performed.   Hemostasis was achieved.  A retractor was placed, and the remainder of the full heparin dose was administered.  The pericardium was opened.  After confirming adequate anticoagulation with ACT measurement, the aorta was cannulated via concentric 2-0  Ethibond pledgeted pursestring sutures.  A dual-stage  venous cannula was placed via a pursestring suture in the right atrial appendage.  Cardiopulmonary bypass was  initiated.  Flows were maintained per protocol.  The right coronary was inspected and the anastomotic site was chosen.  The AtriCure bipolar radiofrequency device was used to ablate the base of the left atrial appendage and the left atrium just proximal to the confluence of the left-sided veins and then the right-sided veins.  Three parallel  ablation lines were performed at each of these sites with confirmation of transmural lesions.  The left atrial appendage was sized for a 35 mm clip, which was placed without difficulty.  A foam pad was placed in the pericardium to insulate the heart, a temperature probe was placed in the myocardial septum, and a cardioplegia cannula was placed in the ascending aorta.  The aorta was cross-clamped.  The left ventricle was emptied via the  aortic root vent.  Cardiac arrest achieved with a combination of cold antegrade blood cardioplegia and topical iced saline.  One liter of cardioplegia was administered.  There was a rapid diastolic arrest and septal cooling to 10 degrees Celsius.  The distal end of the left radial artery was bevelled.  It was anastomosed end-to-side to the distal right coronary.  This was anastomosed just beyond the takeoff of the posterior descending branch.  It was well beyond the stents and disease.  A 2  mm probe did pass distally.  The end-to-side anastomosis was performed with a running 8-0 Prolene suture.  At the completion of the anastomosis, but before tying the suture, a probe passed easily.  Cardioplegia was administered down the aortic root, and there  was good backbleeding from the radial artery.  Additional cardioplegia was administered down the aortic root.  The cardioplegia cannula was removed from the ascending aorta.  The proximal end of the radial artery was bevelled.  It was anastomosed end-to-side to a 4.0 mm punch aortotomy with a running 7-0 Prolene suture.  At the completion of  this anastomosis, the patient was placed in  Trendelenburg position.  Lidocaine was administered.  The aortic root was de-aired and the aortic cross-clamp was removed.  The total cross-clamp time was 35 minutes.  The patient spontaneously resumed sinus  rhythm and did not require defibrillation.  The proximal and distal anastomoses were inspected for hemostasis.  Epicardial pacing wires were placed on the right ventricle and right atrium.  When the patient had rewarmed to a core temperature of 37 degrees Celsius, he was weaned from  cardiopulmonary bypass on the first attempt in sinus rhythm and on no inotropic support.  He was on a low-dose nitroglycerin infusion.  The initial cardiac index was greater than 2 L/min per sq m, and the patient remained hemodynamically stable  throughout the post-bypass period.  Post-bypass transesophageal echocardiography was unchanged from the pre-bypass study.  A test dose of protamine was administered and was well tolerated.  The atrial and aortic cannulae were removed.  The remainder of the protamine was administered  without incident.  The chest was irrigated with warm saline.  Hemostasis was achieved.  The pericardium was reapproximated with interrupted 3-0 silk sutures.  It came together easily without tension or kinking the graft.  A 36-French chest tube was  placed in the anterior mediastinum, and a 28-French Blake drain was placed along the diaphragmatic surface of the pericardium.  Both were secured with #1 silk sutures.  The sternum was relatively soft.  KLS Martin LSS sternal plates were placed  on either side of the sternum to reinforce the wires, a total of 24 screws were utilized.  The sternum then was closed with a combination of single and double  heavy-gauge stainless steel wires.  Pectoralis fascia, subcutaneous tissue and skin were closed in standard fashion.  All sponge, needle and instrument counts were correct at the end of the procedure.  The patient was taken from the operating room to the   surgical intensive care unit in good condition.  LN/NUANCE  D:06/09/2018 T:06/10/2018 JOB:[id number]

## 2018-06-10 NOTE — Progress Notes (Signed)
1 Day Post-Op Procedure(s) (LRB): CORONARY ARTERY BYPASS GRAFTING (CABG) x one using radial artery (N/A) CLIPPING OF ATRIAL APPENDAGE using AtriCure Clip size 35 (N/A) RADIAL ARTERY HARVEST (Left) TRANSESOPHAGEAL ECHOCARDIOGRAM (TEE) (N/A) Subjective: Some incisional pain  Objective: Vital signs in last 24 hours: Temp:  [95.5 F (35.3 C)-100 F (37.8 C)] 99.7 F (37.6 C) (03/14 0600) Pulse Rate:  [66-76] 73 (03/14 0600) Cardiac Rhythm: Normal sinus rhythm (03/14 0400) Resp:  [11-26] 20 (03/14 0600) BP: (94-131)/(60-97) 96/60 (03/14 0600) SpO2:  [96 %-100 %] 96 % (03/14 0600) Arterial Line BP: (105-151)/(43-74) 120/46 (03/14 0600) FiO2 (%):  [40 %-50 %] 40 % (03/13 1700) Weight:  [110.4 kg] 110.4 kg (03/14 0500)  Hemodynamic parameters for last 24 hours: PAP: (19-29)/(4-13) 21/10 CO:  [4.1 L/min-6 L/min] 5.8 L/min CI:  [1.8 L/min/m2-2.6 L/min/m2] 2.5 L/min/m2  Intake/Output from previous day: 03/13 0701 - 03/14 0700 In: 3767 [I.V.:1867.3; Blood:587; IV Piggyback:1312.7] Out: 2742 [Urine:1340; Blood:950; Chest Tube:452] Intake/Output this shift: No intake/output data recorded.  General appearance: alert, cooperative and no distress Neurologic: intact Heart: regular rate and rhythm Lungs: diminished breath sounds bibasilar Abdomen: normal findings: soft, non-tender  Lab Results: Recent Labs    06/09/18 1854 06/10/18 0424  WBC 7.5 9.1  HGB 9.6* 9.9*  HCT 30.1* 29.6*  PLT 154 181   BMET:  Recent Labs    06/09/18 1854 06/10/18 0424  NA 137 136  K 4.2 4.2  CL 114* 109  CO2 20* 19*  GLUCOSE 130* 132*  BUN 10 11  CREATININE 1.10 1.17  CALCIUM 7.8* 8.2*    PT/INR:  Recent Labs    06/09/18 1344  LABPROT 18.1*  INR 1.5*   ABG    Component Value Date/Time   PHART 7.338 (L) 06/09/2018 1853   HCO3 21.1 06/09/2018 1853   TCO2 22 06/09/2018 1853   ACIDBASEDEF 4.0 (H) 06/09/2018 1853   O2SAT 98.0 06/09/2018 1853   CBG (last 3)  Recent Labs     06/10/18 0612 06/10/18 0704 06/10/18 0806  GLUCAP 130* 127* 141*    Assessment/Plan: S/P Procedure(s) (LRB): CORONARY ARTERY BYPASS GRAFTING (CABG) x one using radial artery (N/A) CLIPPING OF ATRIAL APPENDAGE using AtriCure Clip size 35 (N/A) RADIAL ARTERY HARVEST (Left) TRANSESOPHAGEAL ECHOCARDIOGRAM (TEE) (N/A) - Looks good POD  #1   CV- in SR, excellent cardiac output  DC A line, Swan RESP_ IS RENAL- creatinine and lytes OK ENDO- CBG well controlled  levemir + SSI SCD + enoxaparin for DVT prophylaxis DC Chest tubes Cardiac rehab  LOS: 1 day    Loreli Slot 06/10/2018

## 2018-06-10 NOTE — Progress Notes (Signed)
pateint c/o diziness after morphine. Had complained of some diziness pre morphine as well although not so severe. He feels light headed and sweaty. Decreased in time.  Blood pressure decreased initially after giving morphine, to the 90's from 118 SBP, but then came up within minutes. BS 136.

## 2018-06-10 NOTE — Progress Notes (Signed)
      [address]       [place] [address]             [phone]      Sleeping currently BP 116/65   Pulse 63   Temp 99.7 F (37.6 C)   Resp (!) 22   Ht 6\' 1"  (1.854 m)   Wt 110.4 kg   SpO2 95%   BMI 32.11 kg/m   Intake/Output Summary (Last 24 hours) at 06/10/2018 1727 Last data filed at 06/10/2018 1700 Gross per 24 hour  Intake 1585.95 ml  Output 1320 ml  Net 265.95 ml   Problems with vertigo CBG mildly elevated  Doing well POD # 1  Isidra Mings C. Dorris Fetch, MD [facility] [phone]

## 2018-06-11 ENCOUNTER — Inpatient Hospital Stay (HOSPITAL_COMMUNITY): Payer: HMO

## 2018-06-11 ENCOUNTER — Other Ambulatory Visit: Payer: Self-pay

## 2018-06-11 LAB — BASIC METABOLIC PANEL
Anion gap: 9 (ref 5–15)
BUN: 20 mg/dL (ref 8–23)
CO2: 21 mmol/L — ABNORMAL LOW (ref 22–32)
Calcium: 8.4 mg/dL — ABNORMAL LOW (ref 8.9–10.3)
Chloride: 106 mmol/L (ref 98–111)
Creatinine, Ser: 1.69 mg/dL — ABNORMAL HIGH (ref 0.61–1.24)
GFR calc Af Amer: 46 mL/min — ABNORMAL LOW (ref >60)
GFR calc non Af Amer: 40 mL/min — ABNORMAL LOW (ref >60)
Glucose, Bld: 140 mg/dL — ABNORMAL HIGH (ref 70–99)
Potassium: 4 mmol/L (ref 3.5–5.1)
Sodium: 136 mmol/L (ref 135–145)

## 2018-06-11 LAB — GLUCOSE, CAPILLARY
Glucose-Capillary: 122 mg/dL — ABNORMAL HIGH (ref 70–99)
Glucose-Capillary: 226 mg/dL — ABNORMAL HIGH (ref 70–99)
Glucose-Capillary: 178 mg/dL — ABNORMAL HIGH (ref 70–99)
Glucose-Capillary: 167 mg/dL — ABNORMAL HIGH (ref 70–99)
Glucose-Capillary: 118 mg/dL — ABNORMAL HIGH (ref 70–99)

## 2018-06-11 LAB — CBC
HCT: 28.5 % — ABNORMAL LOW (ref 39.0–52.0)
Hemoglobin: 9.1 g/dL — ABNORMAL LOW (ref 13.0–17.0)
MCH: 30.6 pg (ref 26.0–34.0)
MCHC: 31.9 g/dL (ref 30.0–36.0)
MCV: 96 fL (ref 80.0–100.0)
Platelets: 153 10*3/uL (ref 150–400)
RBC: 2.97 MIL/uL — ABNORMAL LOW (ref 4.22–5.81)
RDW: 13.3 % (ref 11.5–15.5)
WBC: 7.4 10*3/uL (ref 4.0–10.5)
nRBC: 0 % (ref 0.0–0.2)

## 2018-06-11 MED ORDER — ZINC ACETATE 25 MG PO CAPS: 25.0000 mg | ORAL_CAPSULE | Freq: Every day | ORAL | Status: DC

## 2018-06-11 MED ORDER — INSULIN ASPART 100 UNIT/ML ~~LOC~~ SOLN: 0.0000 [IU] | Freq: Every day | SUBCUTANEOUS | Status: DC

## 2018-06-11 MED ORDER — SODIUM CHLORIDE 0.9% FLUSH
3.0000 mL | Freq: Two times a day (BID) | INTRAVENOUS | Status: DC
  Administered 2018-06-11 – 2018-06-14 (×6): 3 mL via INTRAVENOUS

## 2018-06-11 MED ORDER — ALUM & MAG HYDROXIDE-SIMETH 200-200-20 MG/5ML PO SUSP: 15.0000 mL | Freq: Four times a day (QID) | ORAL | Status: DC | PRN

## 2018-06-11 MED ORDER — SODIUM CHLORIDE 0.9% FLUSH: 3.0000 mL | INTRAVENOUS | Status: DC | PRN

## 2018-06-11 MED ORDER — ENOXAPARIN SODIUM 30 MG/0.3ML ~~LOC~~ SOLN
30.0000 mg | Freq: Every day | SUBCUTANEOUS | Status: DC
  Administered 2018-06-11 – 2018-06-12 (×2): 30 mg via SUBCUTANEOUS
  Filled 2018-06-11 (×2): qty 0.3

## 2018-06-11 MED ORDER — DIMENHYDRINATE 50 MG PO TABS
50.0000 mg | ORAL_TABLET | Freq: Three times a day (TID) | ORAL | Status: DC | PRN
  Filled 2018-06-11: qty 1

## 2018-06-11 MED ORDER — INSULIN DETEMIR 100 UNIT/ML ~~LOC~~ SOLN
20.0000 [IU] | Freq: Every day | SUBCUTANEOUS | Status: DC
  Administered 2018-06-11 – 2018-06-14 (×4): 20 [IU] via SUBCUTANEOUS
  Filled 2018-06-11 (×4): qty 0.2

## 2018-06-11 MED ORDER — INSULIN ASPART 100 UNIT/ML ~~LOC~~ SOLN
0.0000 [IU] | Freq: Three times a day (TID) | SUBCUTANEOUS | Status: DC
  Administered 2018-06-11 (×2): 3 [IU] via SUBCUTANEOUS
  Administered 2018-06-13 – 2018-06-14 (×2): 2 [IU] via SUBCUTANEOUS

## 2018-06-11 MED ORDER — MAGNESIUM HYDROXIDE 400 MG/5ML PO SUSP: 30.0000 mL | Freq: Every day | ORAL | Status: DC | PRN

## 2018-06-11 MED ORDER — GLIPIZIDE ER 10 MG PO TB24
10.0000 mg | ORAL_TABLET | Freq: Every day | ORAL | Status: DC
  Administered 2018-06-11 – 2018-06-14 (×4): 10 mg via ORAL
  Filled 2018-06-11 (×4): qty 1

## 2018-06-11 MED ORDER — SODIUM CHLORIDE 0.9 % IV SOLN: 250.0000 mL | INTRAVENOUS | Status: DC | PRN

## 2018-06-11 MED ORDER — VITAMIN B-12 100 MCG PO TABS
100.0000 ug | ORAL_TABLET | Freq: Every day | ORAL | Status: DC
  Administered 2018-06-11: 100 ug via ORAL
  Filled 2018-06-11: qty 1

## 2018-06-11 MED ORDER — MOVING RIGHT ALONG BOOK
Freq: Once | Status: AC
  Administered 2018-06-11: 16:00:00

## 2018-06-11 MED ORDER — GLIPIZIDE ER 10 MG PO TB24: 10.0000 mg | ORAL_TABLET | Freq: Two times a day (BID) | ORAL | Status: DC

## 2018-06-11 NOTE — Progress Notes (Signed)
2 Days Post-Op Procedure(s) (LRB): CORONARY ARTERY BYPASS GRAFTING (CABG) x one using radial artery (N/A) CLIPPING OF ATRIAL APPENDAGE using AtriCure Clip size 35 (N/A) RADIAL ARTERY HARVEST (Left) TRANSESOPHAGEAL ECHOCARDIOGRAM (TEE) (N/A) Subjective: Ambulated already this AM Some incisional pain  Objective: Vital signs in last 24 hours: Temp:  [97.8 F (36.6 C)-99.7 F (37.6 C)] 97.9 F (36.6 C) (03/15 0800) Pulse Rate:  [63-78] 78 (03/15 0800) Cardiac Rhythm: Normal sinus rhythm (03/15 0800) Resp:  [19-33] 26 (03/15 0800) BP: (104-152)/(60-87) 137/69 (03/15 0800) SpO2:  [90 %-98 %] 97 % (03/15 0800) Arterial Line BP: (125-133)/(46-48) 125/46 (03/14 1100) Weight:  [109.6 kg] 109.6 kg (03/15 0500)  Hemodynamic parameters for last 24 hours:    Intake/Output from previous day: 03/14 0701 - 03/15 0700 In: 637.3 [P.O.:240; I.V.:197.2; IV Piggyback:200.1] Out: 1010 [Urine:990; Chest Tube:20] Intake/Output this shift: Total I/O In: 130 [P.O.:120; I.V.:10] Out: -   General appearance: alert, cooperative and no distress Neurologic: intact Heart: regular rate and rhythm Lungs: diminished breath sounds bibasilar Abdomen: normal findings: soft, non-tender  Lab Results: Recent Labs    06/10/18 1900 06/11/18 0336  WBC 8.0 7.4  HGB 9.3* 9.1*  HCT 29.3* 28.5*  PLT 166 153   BMET:  Recent Labs    06/10/18 1900 06/11/18 0336  NA 132* 136  K 4.2 4.0  CL 106 106  CO2 22 21*  GLUCOSE 179* 140*  BUN 17 20  CREATININE 1.55* 1.69*  CALCIUM 8.1* 8.4*    PT/INR:  Recent Labs    06/09/18 1344  LABPROT 18.1*  INR 1.5*   ABG    Component Value Date/Time   PHART 7.338 (L) 06/09/2018 1853   HCO3 21.1 06/09/2018 1853   TCO2 22 06/09/2018 1853   ACIDBASEDEF 4.0 (H) 06/09/2018 1853   O2SAT 98.0 06/09/2018 1853   CBG (last 3)  Recent Labs    06/10/18 2004 06/10/18 2334 06/11/18 0321  GLUCAP 163* 137* 122*    Assessment/Plan: S/P Procedure(s) (LRB): CORONARY  ARTERY BYPASS GRAFTING (CABG) x one using radial artery (N/A) CLIPPING OF ATRIAL APPENDAGE using AtriCure Clip size 35 (N/A) RADIAL ARTERY HARVEST (Left) TRANSESOPHAGEAL ECHOCARDIOGRAM (TEE) (N/A) - CV- in SR, BP ok  Continue Imdur for radial graft RESP_ CXR shows a small right apical pneumo- was present yesterday, air collection in mediastinum?  suspect air entrained with CT removal RENAL- creatinine elevated at 1.69 (baseline 3/10 was 1.36)- follow ENDO- CBG mildly elevated- will change to AC.HS, restart glipizide Anemia secondary to ABL- mild, follow DVT prophylaxis- ambulation, SCD, enoxaparin (will decrease dose due to renal issues) Transfer to 4E   LOS: 2 days    Loreli Slot 06/11/2018

## 2018-06-11 NOTE — Evaluation (Signed)
Physical Therapy Evaluation Patient Details Name: Patrick Duran MRN: [id number] DOB: 1946-01-31 Today's Date: 06/11/2018   History of Present Illness  Pt is a 73 y.o. M with significant PMH of CAD, multiple prior percutaneous interventions, hypertension, type 2 DM, vertigo. Presents with substernal chest pain associated with nausea. Now s/p CABG x 1 3/13.  Clinical Impression  Pt admitted with above diagnosis. Pt currently with functional limitations due to the deficits listed below (see PT Problem List). Pt presents with decreased functional mobility secondary to decreased awareness/attention, dizziness, expected surgical pain, decreased cardiovascular endurance, functional strength deficits, and balance impairments. Pt ambulating 100 feet with walker, HR peak 76 bpm, SpO2 > 90% on 2L O2. Pt with history of chronic dizziness, exacerbated with movement, described as "him spinning" and associated with nausea. Episodes last 10 minutes. No nystagmus noted with smooth pursuits. PT order to perform Epley maneuver, but pt and pt wife refuse assessment. Pt will benefit from skilled PT to increase their independence and safety with mobility to allow discharge to the venue listed below.       Follow Up Recommendations Home health PT;Supervision/Assistance - 24 hour    Equipment Recommendations  Rolling walker with 5" wheels;3in1 (PT)    Recommendations for Other Services OT consult     Precautions / Restrictions Precautions Precautions: Sternal;Fall Precaution Booklet Issued: No Precaution Comments: verbally reviewed with pt and pt wife Restrictions Weight Bearing Restrictions: Yes Other Position/Activity Restrictions: sternal precautions      Mobility  Bed Mobility Overal bed mobility: Needs Assistance Bed Mobility: Supine to Sit;Sit to Supine     Supine to sit: Min assist Sit to supine: Mod assist   General bed mobility comments: Min assist for trunk elevation out of bed. Mod assist  for BLE negotiation back into bed  Transfers Overall transfer level: Needs assistance Equipment used: None Transfers: Sit to/from Stand Sit to Stand: Min guard         General transfer comment: Good power up to stand with cues for sternal precautions  Ambulation/Gait Ambulation/Gait assistance: Min guard Gait Distance (Feet): 100 Feet Assistive device: Rolling walker (2 wheeled) Gait Pattern/deviations: Step-through pattern;Decreased stride length;Shuffle Gait velocity: decr Gait velocity interpretation: <1.8 ft/sec, indicate of risk for recurrent falls General Gait Details: Cues provided for gaze stabilization to help with dizziness. Noted decreased bilateral foot clearance and required multiple short, standing rest breaks  Stairs            Wheelchair Mobility    Modified Rankin (Stroke Patients Only)       Balance Overall balance assessment: Needs assistance Sitting-balance support: Feet supported Sitting balance-Leahy Scale: Fair     Standing balance support: Bilateral upper extremity supported Standing balance-Leahy Scale: Poor                               Pertinent Vitals/Pain Pain Assessment: Faces Faces Pain Scale: Hurts even more Pain Location: incisional with bed mobility Pain Descriptors / Indicators: Numbness;Grimacing Pain Intervention(s): Monitored during session    Home Living Family/patient expects to be discharged to:: Private residence Living Arrangements: Spouse/significant other Available Help at Discharge: Family;Available 24 hours/day Type of Home: House Home Access: Stairs to enter Entrance Stairs-Rails: Right Entrance Stairs-Number of Steps: 5 Home Layout: Able to live on main level with bedroom/bathroom Home Equipment: None      Prior Function Level of Independence: Independent         Comments: retired  Hand Dominance        Extremity/Trunk Assessment   Upper Extremity Assessment Upper  Extremity Assessment: Overall WFL for tasks assessed    Lower Extremity Assessment Lower Extremity Assessment: Overall WFL for tasks assessed       Communication   Communication: No difficulties  Cognition Arousal/Alertness: Awake/alert Behavior During Therapy: Flat affect Overall Cognitive Status: Impaired/Different from baseline Area of Impairment: Attention;Memory;Following commands;Awareness                   Current Attention Level: Sustained Memory: Decreased short-term memory;Decreased recall of precautions Following Commands: Follows multi-step commands inconsistently   Awareness: Intellectual   General Comments: Pt drowsy throughout session      General Comments      Exercises     Assessment/Plan    PT Assessment Patient needs continued PT services  PT Problem List Decreased strength;Decreased activity tolerance;Decreased balance;Decreased mobility;Decreased cognition;Decreased safety awareness;Decreased knowledge of precautions;Pain       PT Treatment Interventions DME instruction;Gait training;Stair training;Functional mobility training;Therapeutic activities;Therapeutic exercise;Balance training;Patient/family education    PT Goals (Current goals can be found in the Care Plan section)  Acute Rehab PT Goals Patient Stated Goal: not feel dizzy PT Goal Formulation: With patient Time For Goal Achievement: 06/25/18 Potential to Achieve Goals: Good    Frequency Min 3X/week   Barriers to discharge        Co-evaluation               AM-PAC PT "6 Clicks" Mobility  Outcome Measure Help needed turning from your back to your side while in a flat bed without using bedrails?: A Little Help needed moving from lying on your back to sitting on the side of a flat bed without using bedrails?: A Little Help needed moving to and from a bed to a chair (including a wheelchair)?: A Little Help needed standing up from a chair using your arms (e.g.,  wheelchair or bedside chair)?: A Little Help needed to walk in hospital room?: A Little Help needed climbing 3-5 steps with a railing? : A Lot 6 Click Score: 17    End of Session Equipment Utilized During Treatment: Gait belt Activity Tolerance: Patient tolerated treatment well Patient left: in bed;with call bell/phone within reach;with family/visitor present Nurse Communication: Mobility status PT Visit Diagnosis: Unsteadiness on feet (R26.81);Difficulty in walking, not elsewhere classified (R26.2);Pain Pain - part of body: (chest (incisional))    Time: 1410-1451 PT Time Calculation (min) (ACUTE ONLY): 41 min   Charges:   PT Evaluation $PT Eval Moderate Complexity: 1 Mod PT Treatments $Therapeutic Activity: 23-37 mins       Laurina Bustle, PT, DPT Acute Rehabilitation Services Pager [phone] Office [phone]   Vanetta Mulders 06/11/2018, 4:18 PM

## 2018-06-12 ENCOUNTER — Encounter (HOSPITAL_COMMUNITY): Payer: Self-pay | Admitting: Thoracic Surgery (Cardiothoracic Vascular Surgery)

## 2018-06-12 ENCOUNTER — Inpatient Hospital Stay (HOSPITAL_COMMUNITY): Payer: HMO

## 2018-06-12 LAB — POCT I-STAT 7, (LYTES, BLD GAS, ICA,H+H)
Acid-base deficit: 4 mmol/L — ABNORMAL HIGH (ref 0.0–2.0)
Bicarbonate: 21.4 mmol/L (ref 20.0–28.0)
Calcium, Ion: 1.17 mmol/L (ref 1.15–1.40)
HCT: 25 % — ABNORMAL LOW (ref 39.0–52.0)
Hemoglobin: 8.5 g/dL — ABNORMAL LOW (ref 13.0–17.0)
O2 Saturation: 99 %
Patient temperature: 35.4
Potassium: 3.6 mmol/L (ref 3.5–5.1)
Sodium: 143 mmol/L (ref 135–145)
TCO2: 23 mmol/L (ref 22–32)
pCO2 arterial: 35.8 mmHg (ref 32.0–48.0)
pH, Arterial: 7.378 (ref 7.350–7.450)
pO2, Arterial: 117 mmHg — ABNORMAL HIGH (ref 83.0–108.0)

## 2018-06-12 LAB — BASIC METABOLIC PANEL
Anion gap: 4 — ABNORMAL LOW (ref 5–15)
BUN: 26 mg/dL — ABNORMAL HIGH (ref 8–23)
CO2: 24 mmol/L (ref 22–32)
Calcium: 8.5 mg/dL — ABNORMAL LOW (ref 8.9–10.3)
Chloride: 108 mmol/L (ref 98–111)
Creatinine, Ser: 1.86 mg/dL — ABNORMAL HIGH (ref 0.61–1.24)
GFR calc Af Amer: 41 mL/min — ABNORMAL LOW (ref >60)
GFR calc non Af Amer: 36 mL/min — ABNORMAL LOW (ref >60)
Glucose, Bld: 92 mg/dL (ref 70–99)
Potassium: 3.6 mmol/L (ref 3.5–5.1)
Sodium: 136 mmol/L (ref 135–145)

## 2018-06-12 LAB — GLUCOSE, CAPILLARY
Glucose-Capillary: 80 mg/dL (ref 70–99)
Glucose-Capillary: 100 mg/dL — ABNORMAL HIGH (ref 70–99)
Glucose-Capillary: 85 mg/dL (ref 70–99)
Glucose-Capillary: 104 mg/dL — ABNORMAL HIGH (ref 70–99)

## 2018-06-12 LAB — CBC
HCT: 27.9 % — ABNORMAL LOW (ref 39.0–52.0)
Hemoglobin: 9.1 g/dL — ABNORMAL LOW (ref 13.0–17.0)
MCH: 30.7 pg (ref 26.0–34.0)
MCHC: 32.6 g/dL (ref 30.0–36.0)
MCV: 94.3 fL (ref 80.0–100.0)
Platelets: 165 10*3/uL (ref 150–400)
RBC: 2.96 MIL/uL — ABNORMAL LOW (ref 4.22–5.81)
RDW: 13.3 % (ref 11.5–15.5)
WBC: 7.8 10*3/uL (ref 4.0–10.5)
nRBC: 0 % (ref 0.0–0.2)

## 2018-06-12 MED ORDER — POTASSIUM CHLORIDE CRYS ER 20 MEQ PO TBCR
20.0000 meq | EXTENDED_RELEASE_TABLET | Freq: Every day | ORAL | Status: DC
  Administered 2018-06-12: 20 meq via ORAL
  Filled 2018-06-12: qty 1

## 2018-06-12 MED ORDER — DIAZEPAM 2 MG PO TABS
2.0000 mg | ORAL_TABLET | Freq: Four times a day (QID) | ORAL | Status: DC | PRN
  Administered 2018-06-14: 2 mg via ORAL
  Filled 2018-06-12: qty 1

## 2018-06-12 MED ORDER — TAMSULOSIN HCL 0.4 MG PO CAPS
0.4000 mg | ORAL_CAPSULE | Freq: Every day | ORAL | Status: DC
  Administered 2018-06-12 – 2018-06-14 (×3): 0.4 mg via ORAL
  Filled 2018-06-12 (×3): qty 1

## 2018-06-12 MED ORDER — FUROSEMIDE 40 MG PO TABS
40.0000 mg | ORAL_TABLET | Freq: Every day | ORAL | Status: DC
  Administered 2018-06-12 – 2018-06-14 (×3): 40 mg via ORAL
  Filled 2018-06-12 (×3): qty 1

## 2018-06-12 NOTE — Progress Notes (Signed)
CARDIAC REHAB PHASE I   PRE:  Rate/Rhythm: 78 SR  BP:  Supine: 148/82  Sitting:   Standing:    SaO2: 94%RA  MODE:  Ambulation: 200 ft   POST:  Rate/Rhythm: 88 SR  BP:  Supine:   Sitting: 140/75  Standing:    SaO2: 94%RA 0940-1007 Pt rocked to stand. Walked 200 ft on RA with gait belt use, rolling walker and asst x 2. Used asst x 2 as pt dizzy when up .  Encouraged to look forward. Would recommend rolling walker for home use. Pt assisted back to bed for pacing wire removal.  Wife very supportive and encouraging pt to walk.    Luetta Nutting, RN BSN  06/12/2018 10:04 AM

## 2018-06-12 NOTE — Discharge Summary (Addendum)
Physician Discharge Summary  Patient ID: Patrick Duran MRN: [id number] DOB/AGE: 1945/06/15 73 y.o.  Admit date: 06/09/2018 Discharge date: 06/14/2018  Admission Diagnoses:  Patient Active Problem List   Diagnosis Date Noted  . Unstable angina (HCC) 06/06/2018  . NSTEMI (non-ST elevated myocardial infarction) (HCC) 06/05/2018  . CKD (chronic kidney disease), stage III (HCC) - baseline Scr 1.3 06/04/2018  . Hypertension associated with diabetes (HCC) 05/04/2018  . Type 2 diabetes mellitus without complication, with long-term current use of insulin (HCC) 05/04/2018  . Hypothyroidism 05/04/2018  . Dyslipidemia associated with type 2 diabetes mellitus (HCC) 05/04/2018  . CAD s/p stenting x3 05/04/2018  . Atrial fibrillation (HCC) 05/04/2018  . Vertigo 05/04/2018  . Tinnitus 05/04/2018  . Deafness in left ear 05/04/2018  . GERD (gastroesophageal reflux disease) 05/04/2018  . Chest pain 04/13/2014   Discharge Diagnoses:   Patient Active Problem List   Diagnosis Date Noted  . S/P CABG x 1 06/09/2018  . Unstable angina (HCC) 06/06/2018  . NSTEMI (non-ST elevated myocardial infarction) (HCC) 06/05/2018  . CKD (chronic kidney disease), stage III (HCC) - baseline Scr 1.3 06/04/2018  . Hypertension associated with diabetes (HCC) 05/04/2018  . Type 2 diabetes mellitus without complication, with long-term current use of insulin (HCC) 05/04/2018  . Hypothyroidism 05/04/2018  . Dyslipidemia associated with type 2 diabetes mellitus (HCC) 05/04/2018  . CAD s/p stenting x3 05/04/2018  . Atrial fibrillation (HCC) 05/04/2018  . Vertigo 05/04/2018  . Tinnitus 05/04/2018  . Deafness in left ear 05/04/2018  . GERD (gastroesophageal reflux disease) 05/04/2018  . Chest pain 04/13/2014   Discharged Condition: good  History of Present Illness:  Patrick Duran is a 73 yo white male with PMH of CAD S/P Multiple PCI with stent placements to RCA, HTN, Hyperlipidemia, Type 2 insulin dependent DM,  Ulcers, Hypothyroidism, and vertigo.  The patient had been in his normal state of health until 2 days prior when he developed sudden on set of substernal chest pain associated with nausea, shortness of breath.  He took ASA at home without relief.  He presented to the ED on 06/04/2018.  Workup in the ED consisted of EKG which showed inferior T wave inversions, suggestive of angina/NSTEMI.  Troponin level was normal.  He was admitted for observation.    Hospital Course:   Cardiology took patient for a cardiac catheterization which was performed on 06/06/2018.  This showed continued CAD in his RCA.  It was felt to be inadvisable to place any further stents to the RCA.  Cardiothoracic consult was requested.  He was evaluated by Dr. Dorris Fetch who felt patient would benefit from bypass to the RCA.  He also felt the patient could have a modified MAZE procedure as he has a history of palpitations.  The risks and benefits of the procedure were explained to the patient and he was agreeable to proceed.  He was taken to the operating room and underwent CABG x 1 utilizing Radial artery to RCA.  He also underwent Modified MAZE procedure with pulmonary vein isolation and ablation of atrial appendage.  He also had a clipping of LA appendage.  He also underwent open harvest of left radial artery harvest.  The patient tolerated the procedure without difficulty and was taken to the SICU in stable condition.  The patient was extubated the evening of surgery.  The patient's chest tubes and arterial lines were removed without difficulty.  Follow up CXR showed a small right apical pneumothorax.  He had issues with  vertigo post operatively and his prn medications were ordered.  He was started on Imdur for radial artery graft.  He was maintaining NSR and was transferred to the telemetry unit on 06/11/2018.  The patient developed difficulty voiding after foley removal.  He was started on Flomax for this.  He remained volume overloaded and  was started on gentle diuretics with Lasix.  His creatinine elevated to 1.88.  This was felt to be due to insufficient voiding and hypervolemia.  He was started on Flomax.  He was started on lasix daily.  He developed improvement with urinary output.  His weight trended down.  His creatinine improved to 1.70.  He developed Atrial Fibrillation with RVR.  He was recommended treatment with IV Amiodarone.  The patient refused stating he was leaving the hospital today.  He was counseled on the importance of obtaining good HR control prior to him being discharge, as he would be at increased risk of sudden cardiac death and stroke.  He was also started on Eliquis.  The patient left the hospital AMA.     Significant Diagnostic Studies: angiography:   LM: Normal LAD: Prox 30%, mid 40% focal stenoses LCx: Distal 40% focal stenosis RCA: Focal 90% ISR in prox-mid, mid RCA Distal RCA 70% edge restenosis Diffuse 80% ISR in distal RCA Rest, moderate diffuse ISR Good surgical targets with RPDA and RPLA  Treatments: surgery:   1.  Median sternotomy, extracorporeal circulation, coronary artery bypass grafting x1 with left radial artery to right coronary artery. 2.  Left and right pulmonary vein isolation with radiofrequency ablation.  3.  Ablation of left atrial appendage.   4.  Left atrial clip (35 mm AtriCure PRO clip).  Discharge Exam: Blood pressure 108/74, pulse 72, temperature 98.9 F (37.2 C), temperature source Oral, resp. rate (!) 23, height  (1.854 m), weight 106.7 kg, SpO2 98 %.   Disposition: Home, signed out [facility]  Discharge Medications:   Allergies as of 06/14/2018      Reactions   Codeine Nausea Only   Codeine Sulfate    REACTION: nausea   Eggs Or Egg-derived Products    Hydrocodone Nausea Only   nausea   Oxycodone Nausea Only   Peanut-containing Drug Products    And all nuts   Percocet [oxycodone-acetaminophen]    nausea      Medication List    STOP taking these  medications   hydrALAZINE 25 MG tablet Commonly known as:  APRESOLINE   labetalol 200 MG tablet Commonly known as:  NORMODYNE   lisinopril 10 MG tablet Commonly known as:  PRINIVIL,ZESTRIL   nitroGLYCERIN 0.4 MG SL tablet Commonly known as:  NITROSTAT     TAKE these medications   acetaminophen 500 MG tablet Commonly known as:  TYLENOL Take 500 mg by mouth every 6 (six) hours as needed. For pain   Alpha-Lipoic Acid 300 MG Caps Take by mouth daily.   amiodarone 200 MG tablet Commonly known as:  PACERONE Take 1 tablet (200 mg total) by mouth 2 (two) times daily.   apixaban 5 MG Tabs tablet Commonly known as:  ELIQUIS Take 1 tablet (5 mg total) by mouth 2 (two) times daily.   aspirin 81 MG EC tablet Take 1 tablet (81 mg total) by mouth daily.   atorvastatin 40 MG tablet Commonly known as:  LIPITOR Take 1 tablet (40 mg total) by mouth daily.   co-enzyme Q-10 30 MG capsule Take 120 mg by mouth daily.   diazepam 5  MG tablet Commonly known as:  VALIUM Take 1 tablet (5 mg total) by mouth every 6 (six) hours as needed for anxiety.   Dramamine 50 MG tablet Generic drug:  dimenhyDRINATE Take 50 mg by mouth every 8 (eight) hours as needed.   fenofibrate micronized 134 MG capsule Commonly known as:  LOFIBRA Take 1 capsule (134 mg total) by mouth daily before breakfast.   furosemide 40 MG tablet Commonly known as:  LASIX Take 1 tablet (40 mg total) by mouth daily. Start taking on:  June 15, 2018   glipiZIDE 10 MG 24 hr tablet Commonly known as:  GLUCOTROL XL Take 1 tablet (10 mg total) by mouth 2 (two) times daily. What changed:    how much to take  when to take this   Insulin Glargine 100 UNIT/ML Solostar Pen Commonly known as:  LANTUS Inject 20 Units into the skin as directed. 15-35 units q a.m. What changed:  how much to take   isosorbide mononitrate 30 MG 24 hr tablet Commonly known as:  IMDUR Take 1 tablet (30 mg total) by mouth daily. Start taking on:   June 15, 2018   levothyroxine 125 MCG tablet Commonly known as:  SYNTHROID, LEVOTHROID Take 1 tablet (125 mcg total) by mouth daily before breakfast.   meclizine 12.5 MG tablet Commonly known as:  ANTIVERT Take 1 tablet (12.5 mg total) by mouth 3 (three) times daily as needed for dizziness.   metoprolol tartrate 25 MG tablet Commonly known as:  LOPRESSOR Take 1 tablet (25 mg total) by mouth 2 (two) times daily.   MULTIVITAMIN PO Take 1 tablet by mouth daily.   niacin 100 MG tablet Take 100 mg by mouth daily.   pantoprazole 40 MG tablet Commonly known as:  PROTONIX Take 1 tablet (40 mg total) by mouth daily.   POTASSIUM GLUCONATE PO Take 1 tablet by mouth daily.   tamsulosin 0.4 MG Caps capsule Commonly known as:  FLOMAX Take 1 capsule (0.4 mg total) by mouth daily. Start taking on:  June 15, 2018   traMADol 50 MG tablet Commonly known as:  ULTRAM Take 1-2 tablets (50-100 mg total) by mouth every 4 (four) hours as needed for moderate pain.   vitamin B-12 100 MCG tablet Commonly known as:  CYANOCOBALAMIN Take 100 mcg by mouth daily.   ZINC ACETATE PO Take 1 tablet by mouth daily.            Durable Medical Equipment  (From admission, onward)         Start     Ordered   06/13/18 1457  For home use only DME Walker rolling  Once    Question:  Patient needs a walker to treat with the following condition  Answer:  Hx of CABG   06/13/18 1457   06/13/18 0733  For home use only DME 3 n 1  Once     06/13/18 0732         Follow-up Information    Loreli Slot, MD Follow up on 07/11/2018.   Specialty:  Cardiothoracic Surgery Why:  Appointment is at 12:45, please get CXR at 12:15 at [facility] located on first floor of our office building Contact information: [address] [place] [address] [phone]        Yates Decamp, MD Follow up on 06/30/2018.   Specialty:  Cardiology Why:  Appointment is at 10:45 Contact  information: [address] [place] [address] [phone]  SignedLowella Dandy PA-C [phone]   06/14/2018, 10:48 AM

## 2018-06-12 NOTE — Progress Notes (Addendum)
Dr. Dorris Fetch updated on patient on floor into see patient.   Patient assist x2 to stand and Patient voided 350 ml of tea colored urine. Adolpho Meenach, Randall An RN

## 2018-06-12 NOTE — Evaluation (Signed)
Occupational Therapy Evaluation Patient Details Name: Patrick Duran MRN: [id number] DOB: 1945-11-07 Today's Date: 06/12/2018    History of Present Illness Pt is a 73 y.o. M with significant PMH of CAD, multiple prior percutaneous interventions, hypertension, type 2 DM, vertigo. Presents with substernal chest pain associated with nausea. Now s/p CABG x 1 3/13.   Clinical Impression   Pt was independent prior to admission. Limited visit today due to dizziness and nausea. Educated pt and wife in sternal precautions and provided handout to reinforce. Also educated in multiple uses of 3 in 1. Pt requiring up to max assist for ADL (for LEs). Will follow acutely.    Follow Up Recommendations  Home health OT    Equipment Recommendations  3 in 1 bedside commode    Recommendations for Other Services       Precautions / Restrictions Precautions Precautions: Sternal;Fall Precaution Booklet Issued: Yes (comment) Precaution Comments: sternal precaution handout given and reviewed      Mobility Bed Mobility Overal bed mobility: Needs Assistance Bed Mobility: Rolling;Sidelying to Sit;Sit to Sidelying Rolling: Min guard Sidelying to sit: Mod assist     Sit to sidelying: Mod assist General bed mobility comments: cues for technique, assist to raise trunk and for LEs back into bed  Transfers Overall transfer level: Needs assistance Equipment used: Rolling walker (2 wheeled) Transfers: Sit to/from Stand Sit to Stand: Min guard         General transfer comment: cues for hands on knees, min guard due to dizziness    Balance Overall balance assessment: Needs assistance   Sitting balance-Leahy Scale: Fair     Standing balance support: Bilateral upper extremity supported Standing balance-Leahy Scale: Poor                             ADL either performed or assessed with clinical judgement   ADL Overall ADL's : Needs assistance/impaired Eating/Feeding:  Independent;Sitting   Grooming: Set up;Sitting;Wash/dry face;Wash/dry hands   Upper Body Bathing: Moderate assistance;Sitting   Lower Body Bathing: Maximal assistance;Sit to/from stand   Upper Body Dressing : Sitting;Moderate assistance   Lower Body Dressing: Maximal assistance;Sit to/from Market researcher Details (indicate cue type and reason): educated wife in use of 3 in 1 over toilet       Tub/Shower Transfer Details (indicate cue type and reason): educated wife in use of 3 in 1 as shower seat   General ADL Comments: pt limited by dizziness and nausea, has hx of vertigo     Vision Patient Visual Report: No change from baseline       Perception     Praxis      Pertinent Vitals/Pain Pain Assessment: Faces Faces Pain Scale: Hurts little more Pain Location: incisional with bed mobility Pain Descriptors / Indicators: Numbness;Sore Pain Intervention(s): Monitored during session     Hand Dominance Right   Extremity/Trunk Assessment Upper Extremity Assessment Upper Extremity Assessment: Overall WFL for tasks assessed   Lower Extremity Assessment Lower Extremity Assessment: Defer to PT evaluation       Communication Communication Communication: No difficulties   Cognition Arousal/Alertness: Awake/alert Behavior During Therapy: Flat affect Overall Cognitive Status: Impaired/Different from baseline Area of Impairment: Attention;Memory;Following commands;Awareness                   Current Attention Level: Sustained Memory: Decreased short-term memory;Decreased recall of precautions Following Commands: Follows multi-step commands inconsistently   Awareness:  Intellectual   General Comments: pt distracted by nausea, dizziness   General Comments       Exercises     Shoulder Instructions      Home Living Family/patient expects to be discharged to:: Private residence Living Arrangements: Spouse/significant other Available Help at  Discharge: Family;Available 24 hours/day Type of Home: House Home Access: Stairs to enter [employer] of Steps: 5 Entrance Stairs-Rails: Right Home Layout: Able to live on main level with bedroom/bathroom     Bathroom Shower/Tub: Walk-in shower;Tub/shower unit   Bathroom Toilet: Handicapped height     Home Equipment: Grab bars - toilet          Prior Functioning/Environment Level of Independence: Independent        Comments: retired        OT Problem List: Decreased activity tolerance;Impaired balance (sitting and/or standing);Decreased knowledge of use of DME or AE;Obesity;Decreased knowledge of precautions;Decreased cognition      OT Treatment/Interventions: Self-care/ADL training;Patient/family education;Therapeutic activities;Energy conservation;DME and/or AE instruction    OT Goals(Current goals can be found in the care plan section) Acute Rehab OT Goals Patient Stated Goal: not feel dizzy OT Goal Formulation: With patient Time For Goal Achievement: 06/26/18 Potential to Achieve Goals: Good ADL Goals Pt Will Perform Grooming: with supervision;standing Pt Will Perform Lower Body Bathing: with supervision;sit to/from stand Pt Will Perform Lower Body Dressing: with supervision;sit to/from stand Pt Will Transfer to Toilet: with supervision;ambulating;bedside commode(over toilet) Pt Will Perform Toileting - Clothing Manipulation and hygiene: with supervision;sit to/from stand Pt Will Perform Tub/Shower Transfer: Shower transfer;with supervision;3 in 1;rolling walker Additional ADL Goal #1: Pt will adhere to sternal precautions during ADL independently.  OT Frequency: Min 2X/week   Barriers to D/C:            Co-evaluation              AM-PAC OT "6 Clicks" Daily Activity     Outcome Measure Help from another person eating meals?: None Help from another person taking care of personal grooming?: A Little Help from another person toileting, which  includes using toliet, bedpan, or urinal?: A Lot Help from another person bathing (including washing, rinsing, drying)?: A Lot Help from another person to put on and taking off regular upper body clothing?: A Little Help from another person to put on and taking off regular lower body clothing?: A Lot 6 Click Score: 16   End of Session Equipment Utilized During Treatment: Gait belt;Rolling walker  Activity Tolerance: Treatment limited secondary to medical complications (Comment)(dizziness, nausea) Patient left: in bed;with call bell/phone within reach;with family/visitor present  OT Visit Diagnosis: Unsteadiness on feet (R26.81);Pain;Other symptoms and signs involving cognitive function                Time: 8850-2774 OT Time Calculation (min): 22 min Charges:  OT General Charges $OT Visit: 1 Visit OT Evaluation $OT Eval Moderate Complexity: 1 Mod  Martie Round, OTR/L Acute Rehabilitation Services Pager: [phone] Office: [phone] Evern Bio 06/12/2018, 3:31 PM

## 2018-06-12 NOTE — Progress Notes (Signed)
Patient ambulated with nursing staff and walker in hallway. Patrick Duran, Randall An RN

## 2018-06-12 NOTE — Care Management Important Message (Signed)
Important Message  Patient Details  Name: Patrick Duran MRN: [id number] Date of Birth: 1946/03/16   Medicare Important Message Given:  Yes    [name] 06/12/2018, 4:09 PM

## 2018-06-12 NOTE — Progress Notes (Signed)
Pt has not voided since 1840 hrs scanned pt's bladder 250-270 cc of urine found, pt denies urge to void. encouraged pt  To increase liquids will rescan in 2 hours

## 2018-06-12 NOTE — Progress Notes (Signed)
Encouraged ambulation and for patient to get into chair. Patient states he is tired will continue to encourage. Saleha Kalp, Randall An rN

## 2018-06-12 NOTE — Progress Notes (Signed)
Per patient patient voided around 8 am this morning. Patient has not had the urge to void and dose not feel uncomfortable. Bladder scan performed. And 300 ml showed on scan. Patient appears very tired and is now asleep. Vital signs stable at this time. Will monitor patient. Courtlynn Holloman, Randall An rN

## 2018-06-12 NOTE — Progress Notes (Signed)
Patient EPW pulled per protocol and and ordered. All ends intact patient reminded to lie supine approximately one hour. bp 153/87 Willford Rabideau, Randall An RN

## 2018-06-12 NOTE — Progress Notes (Signed)
Patient with complaints of nausea. Zofran given as ordered as needed for nausea. Cool washcloth placed on head. Felicite Zeimet, Randall An rN

## 2018-06-12 NOTE — Progress Notes (Addendum)
      [address]       [place] [address]             [phone]      3 Days Post-Op Procedure(s) (LRB): CORONARY ARTERY BYPASS GRAFTING (CABG) x one using radial artery (N/A) CLIPPING OF ATRIAL APPENDAGE using AtriCure Clip size 35 (N/A) RADIAL ARTERY HARVEST (Left) TRANSESOPHAGEAL ECHOCARDIOGRAM (TEE) (N/A)   Subjective:  Patient with some shoulder discomfort.  He also states he has had some difficulty voiding, has not yet moved his bowels.  Has some dizziness with ambulation, but has vertigo chronically  Objective: Vital signs in last 24 hours: Temp:  [97.5 F (36.4 C)-98.2 F (36.8 C)] 98.2 F (36.8 C) (03/16 0630) Pulse Rate:  [64-83] 83 (03/16 0630) Cardiac Rhythm: Normal sinus rhythm (03/16 0700) Resp:  [16-28] 18 (03/16 0646) BP: (120-150)/(69-90) 139/77 (03/16 0630) SpO2:  [93 %-100 %] 95 % (03/16 0630) Weight:  [109.4 kg] 109.4 kg (03/16 0646)  Intake/Output from previous day: 03/15 0701 - 03/16 0700 In: 649.8 [P.O.:600; I.V.:49.8] Out: 705 [Urine:705]  General appearance: alert, cooperative and no distress Heart: regular rate and rhythm Lungs: clear to auscultation bilaterally Abdomen: soft, non-tender; bowel sounds normal; no masses,  no organomegaly Extremities: edema trace Wound: clean and dry  Lab Results: Recent Labs    06/11/18 0336 06/12/18 0245  WBC 7.4 7.8  HGB 9.1* 9.1*  HCT 28.5* 27.9*  PLT 153 165   BMET:  Recent Labs    06/11/18 0336 06/12/18 0245  NA 136 136  K 4.0 3.6  CL 106 108  CO2 21* 24  GLUCOSE 140* 92  BUN 20 26*  CREATININE 1.69* 1.86*  CALCIUM 8.4* 8.5*    PT/INR:  Recent Labs    06/09/18 1344  LABPROT 18.1*  INR 1.5*   ABG    Component Value Date/Time   PHART 7.338 (L) 06/09/2018 1853   HCO3 21.1 06/09/2018 1853   TCO2 22 06/09/2018 1853   ACIDBASEDEF 4.0 (H) 06/09/2018 1853   O2SAT 98.0 06/09/2018 1853   CBG (last 3)  Recent Labs    06/11/18 1613 06/11/18 2144 06/12/18 0634   GLUCAP 167* 118* 80    Assessment/Plan: S/P Procedure(s) (LRB): CORONARY ARTERY BYPASS GRAFTING (CABG) x one using radial artery (N/A) CLIPPING OF ATRIAL APPENDAGE using AtriCure Clip size 35 (N/A) RADIAL ARTERY HARVEST (Left) TRANSESOPHAGEAL ECHOCARDIOGRAM (TEE) (N/A)  1. CV- NSR, BP okay- continue Lopressor, Imdur for radial artery graft 2. Pulm- off oxygen, no acute issues, small right apical pneumothorax 3. Renal- creatinine up to 1.86, weight is elevated, however minimal to no edema on exam, start Flomax for urinary difficulty, if weight is accurate may benefit from low dose Lasix,  4. Expected post operative blood loss anemia, mild Hgb 9.1 5. DM- sugars controlled, continue current regimen 6. Dispo- patient stable, watch creatinine, up to 1.86 not on nephrotoxic agent, start flomax as patient states having some difficulty with voiding, may benefit from Lasix as weight is elevated, d/c EPW today continue current care   LOS: 3 days    [name] 06/12/2018 Patient seen and examined, agree with above His creatinine is up slightly again today Was able to void with no issues re: retention I do not think he is as volume overloaded as weight suggests but is up a little  [name], MD [facility] [phone]

## 2018-06-13 LAB — BPAM RBC
Blood Product Expiration Date: 202004132359
Blood Product Expiration Date: 202004132359
ISSUE DATE / TIME: 202003130826
ISSUE DATE / TIME: 202003131822
Unit Type and Rh: 5100
Unit Type and Rh: 5100

## 2018-06-13 LAB — GLUCOSE, CAPILLARY
Glucose-Capillary: 78 mg/dL (ref 70–99)
Glucose-Capillary: 110 mg/dL — ABNORMAL HIGH (ref 70–99)
Glucose-Capillary: 139 mg/dL — ABNORMAL HIGH (ref 70–99)
Glucose-Capillary: 128 mg/dL — ABNORMAL HIGH (ref 70–99)

## 2018-06-13 LAB — TYPE AND SCREEN
ABO/RH(D): O NEG
Antibody Screen: NEGATIVE
Unit division: 0
Unit division: 0

## 2018-06-13 LAB — BASIC METABOLIC PANEL
Anion gap: 8 (ref 5–15)
BUN: 29 mg/dL — ABNORMAL HIGH (ref 8–23)
CO2: 23 mmol/L (ref 22–32)
Calcium: 8.8 mg/dL — ABNORMAL LOW (ref 8.9–10.3)
Chloride: 106 mmol/L (ref 98–111)
Creatinine, Ser: 1.88 mg/dL — ABNORMAL HIGH (ref 0.61–1.24)
GFR calc Af Amer: 41 mL/min — ABNORMAL LOW (ref >60)
GFR calc non Af Amer: 35 mL/min — ABNORMAL LOW (ref >60)
Glucose, Bld: 89 mg/dL (ref 70–99)
Potassium: 3.6 mmol/L (ref 3.5–5.1)
Sodium: 137 mmol/L (ref 135–145)

## 2018-06-13 LAB — ECHO INTRAOPERATIVE TEE
Height: 73 in
Weight: 3668.45 oz

## 2018-06-13 MED ORDER — POTASSIUM CHLORIDE CRYS ER 20 MEQ PO TBCR
20.0000 meq | EXTENDED_RELEASE_TABLET | Freq: Every day | ORAL | Status: DC
  Administered 2018-06-13 – 2018-06-14 (×2): 20 meq via ORAL
  Filled 2018-06-13 (×2): qty 1

## 2018-06-13 NOTE — TOC Initial Note (Signed)
Transition of Care [facility]) - Initial/Assessment Note    Patient Details  Name: Patrick Duran MRN: [id number] Date of Birth: 18-Aug-1945  Transition of Care [facility]) CM/SW Contact:    Reola Mosher Phone Number: [phone] 06/13/2018, 2:54 PM  Clinical Narrative:                 CM talked to pt/ spouse at the bedside, he is refusing all HHC services at this time; pt/ spouse is agreeable to DME; Fayrene Fearing with [facility] ) called to deliver the RW and 3:1 to the room. CM will continue to follow for progression of care.  Expected Discharge Plan: Home w Home Health Services Barriers to Discharge: No Barriers Identified   Patient Goals and CMS Choice Patient states their goals for this hospitalization and ongoing recovery are:: to stay at home CMS Medicare.gov Compare Post Acute Care list provided to:: Patient Choice offered to / list presented to : Patient, Spouse  Expected Discharge Plan and Services Expected Discharge Plan: Home w Home Health Services Discharge Planning Services: CM Consult   Living arrangements for the past 2 months: Single Family Home                 DME Arranged: Dan Humphreys, 3-N-1 DME Agency: [facility] HH Arranged: Refused HH(Patient and spouse do not want any HHC at this time; )    Prior Living Arrangements/Services Living arrangements for the past 2 months: Single Family Home Lives with:: Spouse Patient language and need for interpreter reviewed:: No Do you feel safe going back to the place where you live?: Yes      Need for Family Participation in Patient Care: No (Comment) Care giver support system in place?: Yes (comment)   Criminal Activity/Legal Involvement Pertinent to Current Situation/Hospitalization: No - Comment as needed  Activities of Daily Living Home Assistive Devices/Equipment: CBG Meter ADL Screening (condition at time of admission) Patient's cognitive ability adequate to safely complete daily activities?:  Yes Is the patient deaf or have difficulty hearing?: Yes Does the patient have difficulty seeing, even when wearing glasses/contacts?: No Does the patient have difficulty concentrating, remembering, or making decisions?: No Patient able to express need for assistance with ADLs?: No Does the patient have difficulty dressing or bathing?: No Independently performs ADLs?: Yes (appropriate for developmental age) Does the patient have difficulty walking or climbing stairs?: No Weakness of Legs: None Weakness of Arms/Hands: None  Permission Sought/Granted Permission sought to share information with : Case Manager Permission granted to share information with : Yes, Verbal Permission Granted  Share Information with NAME: Meriam Sprague spouse           Emotional Assessment Appearance:: Well-Groomed Attitude/Demeanor/Rapport: Gracious Affect (typically observed): Accepting Orientation: : Oriented to Self, Oriented to  Time, Oriented to Place, Oriented to Situation Alcohol / Substance Use: Not Applicable Psych Involvement: No (comment)  Admission diagnosis:  CAD AFIB Patient Active Problem List   Diagnosis Date Noted  . S/P CABG x 1 06/09/2018  . Unstable angina (HCC) 06/06/2018  . NSTEMI (non-ST elevated myocardial infarction) (HCC) 06/05/2018  . CKD (chronic kidney disease), stage III (HCC) - baseline Scr 1.3 06/04/2018  . Hypertension associated with diabetes (HCC) 05/04/2018  . Type 2 diabetes mellitus without complication, with long-term current use of insulin (HCC) 05/04/2018  . Hypothyroidism 05/04/2018  . Dyslipidemia associated with type 2 diabetes mellitus (HCC) 05/04/2018  . CAD s/p stenting x3 05/04/2018  . Atrial fibrillation (HCC) 05/04/2018  . Vertigo 05/04/2018  .  Tinnitus 05/04/2018  . Deafness in left ear 05/04/2018  . GERD (gastroesophageal reflux disease) 05/04/2018  . Chest pain 04/13/2014   PCP:  Ardith Dark, MD Pharmacy:   [facility] [address], [place] - [address] [place] [address] Phone: [phone] Fax: [phone]     Social Determinants of Health (SDOH) Interventions    Readmission Risk Interventions 30 Day Unplanned Readmission Risk Score     Admission (Current) from 06/09/2018 in [facility] 4E CV SURGICAL PROGRESSIVE CARE  30 Day Unplanned Readmission Risk Score (%)  22 Filed at 06/13/2018 1200     This score is the patient's risk of an unplanned readmission within 30 days of being discharged (0 -100%). The score is based on dignosis, age, lab data, medications, orders, and past utilization.   Low:  0-14.9   Medium: 15-21.9   High: 22-29.9   Extreme: 30 and above       No flowsheet data found.

## 2018-06-13 NOTE — Progress Notes (Signed)
CARDIAC REHAB PHASE I   PRE:  Rate/Rhythm: 80 SR  BP:  Supine:   Sitting: 139/74  Standing:    SaO2: 93%RA  MODE:  Ambulation: 400 ft   POST:  Rate/Rhythm: 99 SR  BP:  Supine:   Sitting: 134/72  Standing:    SaO2: 99%RA 0922-1000 Pt walked 400 ft on RA with gait belt use and rolling walker and asst x 1. Pt did not c/o dizziness today and tolerated walk better. To recliner after walk. Pt rocked to stand. Call bell in reach.   Luetta Nutting, RN BSN  06/13/2018 9:54 AM

## 2018-06-13 NOTE — Progress Notes (Signed)
Physical Therapy Treatment Patient Details Name: Patrick Duran MRN: [id number] DOB: [birth year] Today's Date: 06/13/2018    History of Present Illness Pt is a 73 y.o. M with significant PMH of CAD, multiple prior percutaneous interventions, hypertension, type 2 DM, vertigo. Presents with substernal chest pain associated with nausea. Now s/p CABG x 1 3/13.    PT Comments    Pt verbalizing being frustrated at still being in the hospital, but ultimately agreeable to participate. Making progress towards his physical therapy goals, ambulating 400 feet with walker and min guard assist. Continues to require frequent reinforcement for maintenance of sternal precautions. Reviewed generalized walking program and postural re-education exercises.     Follow Up Recommendations  Home health PT;Supervision/Assistance - 24 hour     Equipment Recommendations  Rolling walker with 5" wheels;3in1 (PT)    Recommendations for Other Services OT consult     Precautions / Restrictions Precautions Precautions: Sternal;Fall Precaution Booklet Issued: No Precaution Comments: verbally reviewed with pt and pt wife Restrictions Weight Bearing Restrictions: Yes Other Position/Activity Restrictions: sternal precautions    Mobility  Bed Mobility Overal bed mobility: Needs Assistance Bed Mobility: Supine to Sit     Supine to sit: Min assist     General bed mobility comments: Pt wife providing min assist for trunk elevation out of bed  Transfers Overall transfer level: Needs assistance Equipment used: None Transfers: Sit to/from Stand Sit to Stand: Min guard         General transfer comment: Cues for hand placement to maintain sternal precautions with visual demonstration  Ambulation/Gait Ambulation/Gait assistance: Min guard Gait Distance (Feet): 400 Feet Assistive device: Rolling walker (2 wheeled) Gait Pattern/deviations: Step-through pattern;Decreased stride length;Shuffle Gait velocity:  decr   General Gait Details: Pt with improved gait speed and stride length this session   Stairs             Wheelchair Mobility    Modified Rankin (Stroke Patients Only)       Balance Overall balance assessment: Needs assistance Sitting-balance support: Feet supported Sitting balance-Leahy Scale: Good     Standing balance support: Bilateral upper extremity supported Standing balance-Leahy Scale: Poor                              Cognition Arousal/Alertness: Awake/alert Behavior During Therapy: Flat affect Overall Cognitive Status: Impaired/Different from baseline Area of Impairment: Attention;Memory;Following commands;Awareness                   Current Attention Level: Sustained Memory: Decreased short-term memory;Decreased recall of precautions Following Commands: Follows multi-step commands inconsistently   Awareness: Emergent          Exercises Other Exercises Other Exercises: Instructed pt on scapular squeezes for postural re-education    General Comments        Pertinent Vitals/Pain Pain Assessment: Faces Faces Pain Scale: Hurts a little bit Pain Location: incisional with bed mobility Pain Descriptors / Indicators: Operative site guarding Pain Intervention(s): Monitored during session    Home Living                      Prior Function            PT Goals (current goals can now be found in the care plan section) Acute Rehab PT Goals Patient Stated Goal: go home PT Goal Formulation: With patient Time For Goal Achievement: 06/25/18 Potential to Achieve Goals: Good Progress towards  PT goals: Progressing toward goals    Frequency    Min 3X/week      PT Plan Current plan remains appropriate    Co-evaluation              AM-PAC PT "6 Clicks" Mobility   Outcome Measure  Help needed turning from your back to your side while in a flat bed without using bedrails?: A Little Help needed moving from  lying on your back to sitting on the side of a flat bed without using bedrails?: A Little Help needed moving to and from a bed to a chair (including a wheelchair)?: A Little Help needed standing up from a chair using your arms (e.g., wheelchair or bedside chair)?: A Little Help needed to walk in hospital room?: A Little Help needed climbing 3-5 steps with a railing? : A Lot 6 Click Score: 17    End of Session Equipment Utilized During Treatment: Gait belt Activity Tolerance: Patient tolerated treatment well Patient left: in bed;with call bell/phone within reach;with family/visitor present Nurse Communication: Mobility status PT Visit Diagnosis: Unsteadiness on feet (R26.81);Difficulty in walking, not elsewhere classified (R26.2);Pain Pain - part of body: (chest (incisional))     Time: 3790-2409 PT Time Calculation (min) (ACUTE ONLY): 24 min  Charges:  $Therapeutic Activity: 23-37 mins                    Laurina Bustle, [place], DPT Acute Rehabilitation Services Pager [phone] Office [phone]    Vanetta Mulders 06/13/2018, 4:47 PM

## 2018-06-13 NOTE — Progress Notes (Addendum)
      [address]       [place] [address]             [phone]      4 Days Post-Op Procedure(s) (LRB): CORONARY ARTERY BYPASS GRAFTING (CABG) x one using radial artery (N/A) CLIPPING OF ATRIAL APPENDAGE using AtriCure Clip size 35 (N/A) RADIAL ARTERY HARVEST (Left) TRANSESOPHAGEAL ECHOCARDIOGRAM (TEE) (N/A)   Subjective:  No new complaints.  Feels like he is voiding better.  He wants to go home as he has been here 8 days.  + ambulation  +BM  Objective: Vital signs in last 24 hours: Temp:  [98 F (36.7 C)-98.2 F (36.8 C)] 98 F (36.7 C) (03/17 0620) Pulse Rate:  [72-92] 92 (03/17 0620) Cardiac Rhythm: Normal sinus rhythm (03/17 0700) Resp:  [17-24] 24 (03/17 0620) BP: (94-154)/(61-93) 140/93 (03/17 0620) SpO2:  [93 %-98 %] 98 % (03/17 0620) Weight:  [107.6 kg] 107.6 kg (03/17 0620)  Intake/Output from previous day: 03/16 0701 - 03/17 0700 In: 400 [P.O.:400] Out: 1275 [Urine:1275]  General appearance: alert, cooperative and no distress Heart: regular rate and rhythm Lungs: clear to auscultation bilaterally Abdomen: soft, non-tender; bowel sounds normal; no masses,  no organomegaly Extremities: edema trace Wound: clean and dry  Lab Results: Recent Labs    06/11/18 0336 06/12/18 0245  WBC 7.4 7.8  HGB 9.1* 9.1*  HCT 28.5* 27.9*  PLT 153 165   BMET:  Recent Labs    06/12/18 0245 06/13/18 0303  NA 136 137  K 3.6 3.6  CL 108 106  CO2 24 23  GLUCOSE 92 89  BUN 26* 29*  CREATININE 1.86* 1.88*  CALCIUM 8.5* 8.8*    PT/INR: No results for input(s): LABPROT, INR in the last 72 hours. ABG    Component Value Date/Time   PHART 7.338 (L) 06/09/2018 1853   HCO3 21.1 06/09/2018 1853   TCO2 22 06/09/2018 1853   ACIDBASEDEF 4.0 (H) 06/09/2018 1853   O2SAT 98.0 06/09/2018 1853   CBG (last 3)  Recent Labs    06/12/18 1643 06/12/18 2133 06/13/18 0624  GLUCAP 85 104* 78    Assessment/Plan: S/P Procedure(s) (LRB): CORONARY ARTERY  BYPASS GRAFTING (CABG) x one using radial artery (N/A) CLIPPING OF ATRIAL APPENDAGE using AtriCure Clip size 35 (N/A) RADIAL ARTERY HARVEST (Left) TRANSESOPHAGEAL ECHOCARDIOGRAM (TEE) (N/A)  1. CV- NSR, BP controlled- Lopressor, Imdur for radial graft 2. Pulm- no acute issues, continue IS 3. Renal- creatinine stayed stable at 1.88, weight trended down with Lasix, will continue Lasix, potassium 4. DM- sugars remain well controlled 5. Dispo- patient stable, continue diuretics, will repeat BMET in AM if creatinine remains stable or improves, will plan to d/c in AM   LOS: 4 days    [name] PA-C [phone]   06/13/2018 Patient seen and examined, agree with above Looks much better today, felt stronger on walk Creatinine stable Home in AM if creatinine stable or improved  [name], MD [facility] [phone]

## 2018-06-14 LAB — BASIC METABOLIC PANEL
Anion gap: 6 (ref 5–15)
BUN: 27 mg/dL — ABNORMAL HIGH (ref 8–23)
CO2: 27 mmol/L (ref 22–32)
Calcium: 8.8 mg/dL — ABNORMAL LOW (ref 8.9–10.3)
Chloride: 105 mmol/L (ref 98–111)
Creatinine, Ser: 1.7 mg/dL — ABNORMAL HIGH (ref 0.61–1.24)
GFR calc Af Amer: 46 mL/min — ABNORMAL LOW (ref >60)
GFR calc non Af Amer: 40 mL/min — ABNORMAL LOW (ref >60)
Glucose, Bld: 92 mg/dL (ref 70–99)
Potassium: 3.3 mmol/L — ABNORMAL LOW (ref 3.5–5.1)
Sodium: 138 mmol/L (ref 135–145)

## 2018-06-14 LAB — GLUCOSE, CAPILLARY: Glucose-Capillary: 126 mg/dL — ABNORMAL HIGH (ref 70–99)

## 2018-06-14 MED ORDER — AMIODARONE HCL 200 MG PO TABS
200.0000 mg | ORAL_TABLET | Freq: Two times a day (BID) | ORAL | Status: DC
  Administered 2018-06-14: 200 mg via ORAL
  Filled 2018-06-14: qty 1

## 2018-06-14 MED ORDER — APIXABAN 5 MG PO TABS: 5.0000 mg | ORAL_TABLET | Freq: Two times a day (BID) | ORAL | 3 refills | Status: DC

## 2018-06-14 MED ORDER — AMIODARONE HCL 200 MG PO TABS: 200.0000 mg | ORAL_TABLET | Freq: Two times a day (BID) | ORAL | 1 refills | Status: DC

## 2018-06-14 MED ORDER — ISOSORBIDE MONONITRATE ER 30 MG PO TB24: 30.0000 mg | ORAL_TABLET | Freq: Every day | ORAL | 0 refills | Status: DC

## 2018-06-14 MED ORDER — POTASSIUM CHLORIDE CRYS ER 20 MEQ PO TBCR
40.0000 meq | EXTENDED_RELEASE_TABLET | Freq: Once | ORAL | Status: AC
  Administered 2018-06-14: 40 meq via ORAL
  Filled 2018-06-14: qty 2

## 2018-06-14 MED ORDER — METOPROLOL TARTRATE 25 MG PO TABS: 25.0000 mg | ORAL_TABLET | Freq: Two times a day (BID) | ORAL | 3 refills | Status: DC

## 2018-06-14 MED ORDER — TAMSULOSIN HCL 0.4 MG PO CAPS: 0.4000 mg | ORAL_CAPSULE | Freq: Every day | ORAL | 3 refills | Status: DC

## 2018-06-14 MED ORDER — TRAMADOL HCL 50 MG PO TABS: 50.0000 mg | ORAL_TABLET | ORAL | 0 refills | Status: DC | PRN

## 2018-06-14 MED ORDER — APIXABAN 5 MG PO TABS
5.0000 mg | ORAL_TABLET | Freq: Two times a day (BID) | ORAL | Status: DC
  Administered 2018-06-14: 5 mg via ORAL
  Filled 2018-06-14: qty 1

## 2018-06-14 MED ORDER — POTASSIUM CHLORIDE CRYS ER 20 MEQ PO TBCR: 40.0000 meq | EXTENDED_RELEASE_TABLET | Freq: Once | ORAL | Status: DC

## 2018-06-14 MED ORDER — FUROSEMIDE 40 MG PO TABS: 40.0000 mg | ORAL_TABLET | Freq: Every day | ORAL | 0 refills | Status: DC

## 2018-06-14 MED FILL — Electrolyte-R (PH 7.4) Solution: INTRAVENOUS | Qty: 3000 | Status: AC

## 2018-06-14 MED FILL — Heparin Sodium (Porcine) Inj 1000 Unit/ML: INTRAMUSCULAR | Qty: 30 | Status: AC

## 2018-06-14 MED FILL — Heparin Sodium (Porcine) Inj 1000 Unit/ML: INTRAMUSCULAR | Qty: 10 | Status: AC

## 2018-06-14 MED FILL — Sodium Chloride IV Soln 0.9%: INTRAVENOUS | Qty: 2000 | Status: AC

## 2018-06-14 MED FILL — Sodium Bicarbonate IV Soln 8.4%: INTRAVENOUS | Qty: 50 | Status: AC

## 2018-06-14 MED FILL — Magnesium Sulfate Inj 50%: INTRAMUSCULAR | Qty: 10 | Status: AC

## 2018-06-14 MED FILL — Lidocaine HCl(Cardiac) IV PF Soln Pref Syr 100 MG/5ML (2%): INTRAVENOUS | Qty: 5 | Status: AC

## 2018-06-14 MED FILL — Potassium Chloride Inj 2 mEq/ML: INTRAVENOUS | Qty: 40 | Status: AC

## 2018-06-14 NOTE — Progress Notes (Signed)
Patient resting in bed and heart rate in low 130's with A-fib. BP 141/84. Patient reports feeling as if he is having a "restless night". Administered PRN dose of IV Metoprolol for treatment of increased heart rate.

## 2018-06-14 NOTE — Plan of Care (Signed)
Patient in afib RVR, patient leaving AMA.

## 2018-06-14 NOTE — Progress Notes (Signed)
[phone] Reviewed staying in the tube and sternal precautions. Discussed IS, walking for ex( listening to his body and to not overdo with elevated heart rate). Gave heart healthy diet and discussed CRP 2. Referred to [place]. Wife and pt voiced understanding. He has walker in room for home. Luetta Nutting RN BSN 06/14/2018 10:57 AM

## 2018-06-14 NOTE — TOC Transition Note (Signed)
Transition of Care [facility]) - CM/SW Discharge Note   Patient Details  Name: Patrick Duran MRN: [id number] Date of Birth: Oct 03, 1945  Transition of Care [facility]) CM/SW Contact:  Lawerance Sabal, RN Phone Number: 06/14/2018, 10:39 AM   Clinical Narrative:      Verified that patient has DME in room to take at DC. Patient has refused HH services. No other CM needs identified.   Expected Discharge Plan and Services Barriers to Discharge: No Barriers Identified Expected Discharge Plan: Home w Home Health Services Discharge Planning Services: CM Consult   Living arrangements for the past 2 months: Single Family Home                 DME Arranged: Dan Humphreys, 3-N-1 DME Agency: [facility] HH Arranged: Refused HH(Patient and spouse do not want any HHC at this time; )     Final next level of care: Home/Self Care Barriers to Discharge: No Barriers Identified   Patient Goals and CMS Choice Patient states their goals for this hospitalization and ongoing recovery are:: to stay at home CMS Medicare.gov Compare Post Acute Care list provided to:: Patient Choice offered to / list presented to : Patient, Spouse  Discharge Placement                       Discharge Plan and Services Discharge Planning Services: CM Consult            DME Arranged: Dan Humphreys, 3-N-1 DME Agency: [facility] HH Arranged: Refused HH(Patient and spouse do not want any HHC at this time; )     Social Determinants of Health (SDOH) Interventions     Readmission Risk Interventions No flowsheet data found.

## 2018-06-14 NOTE — Discharge Instructions (Signed)
Discharge Instructions:  1. You may shower, please wash incisions daily with soap and water and keep dry.  If you wish to cover wounds with dressing you may do so but please keep clean and change daily.  No tub baths or swimming until incisions have completely healed.  If your incisions become red or develop any drainage please call our office at [phone]  2. No Driving until cleared by Dr. Hendrickson's office and you are no longer using narcotic pain medications  3. Monitor your weight daily.. Please use the same scale and weigh at same time... If you gain 5-10 lbs in 48 hours with associated lower extremity swelling, please contact our office at [phone]  4. Fever of 101.5 for at least 24 hours with no source, please contact our office at [phone]  5. Activity- up as tolerated, please walk at least 3 times per day.  Avoid strenuous activity, no lifting, pushing, or pulling with your arms over 8-10 lbs for a minimum of 6 weeks  6. If any questions or concerns arise, please do not hesitate to contact our office at [phone]]   ===============================================================================================  Information on my medicine - ELIQUIS (apixaban)  This medication education was reviewed with me or my healthcare representative as part of my discharge preparation.   Why was Eliquis prescribed for you? Eliquis was prescribed for you to reduce the risk of a blood clot forming that can cause a stroke if you have a medical condition called atrial fibrillation (a type of irregular heartbeat).  What do You need to know about Eliquis ? Take your Eliquis TWICE DAILY - one tablet in the morning and one tablet in the evening with or without food. If you have difficulty swallowing the tablet whole please discuss with your pharmacist how to take the medication safely.  Take Eliquis exactly as prescribed by your doctor and DO NOT stop taking Eliquis without  talking to the doctor who prescribed the medication.  Stopping may increase your risk of developing a stroke.  Refill your prescription before you run out.  After discharge, you should have regular check-up appointments with your healthcare provider that is prescribing your Eliquis.  In the future your dose may need to be changed if your kidney function or weight changes by a significant amount or as you get older.  What do you do if you miss a dose? If you miss a dose, take it as soon as you remember on the same day and resume taking twice daily.  Do not take more than one dose of ELIQUIS at the same time to make up a missed dose.  Important Safety Information A possible side effect of Eliquis is bleeding. You should call your healthcare provider right away if you experience any of the following: Bleeding from an injury or your nose that does not stop. Unusual colored urine (red or dark brown) or unusual colored stools (red or black). Unusual bruising for unknown reasons. A serious fall or if you hit your head (even if there is no bleeding).  Some medicines may interact with Eliquis and might increase your risk of bleeding or clotting while on Eliquis. To help avoid this, consult your healthcare provider or pharmacist prior to using any new prescription or non-prescription medications, including herbals, vitamins, non-steroidal anti-inflammatory drugs (NSAIDs) and supplements.  This website has more information on Eliquis (apixaban): http://www.eliquis.com/eliquis/home   

## 2018-06-14 NOTE — Progress Notes (Addendum)
Patient leaving AMA. Patient education given to  patient and his wife, the patient insisted on leaving the hospital.Emotional support given. Discharged education reviewed with patient he verbalized understanding, patient belongings at bedside, equipment for home use at bedside, patient to be transported home by his wife. Chest tube sutures removed per verbal order from [place] PA

## 2018-06-14 NOTE — Progress Notes (Signed)
ANTICOAGULATION CONSULT NOTE - Initial Consult  Pharmacy Consult for Eliquis Indication: atrial fibrillation  Allergies  Allergen Reactions  . Codeine Nausea Only  . Codeine Sulfate     REACTION: nausea  . Eggs Or Egg-Derived Products   . Hydrocodone Nausea Only    nausea  . Oxycodone Nausea Only  . Peanut-Containing Drug Products     And all nuts  . Percocet [Oxycodone-Acetaminophen]     nausea    Patient Measurements: Height: 6\' 1"  (185.4 cm) Weight: 235 lb 4.8 oz (106.7 kg) IBW/kg (Calculated) : 79.9  Vital Signs: Temp: 98.9 F (37.2 C) (03/18 0806) Temp Source: Oral (03/18 0806) BP: 136/87 (03/18 0806) Pulse Rate: 72 (03/18 0806)  Labs: Recent Labs    06/12/18 0245 06/13/18 0303 06/14/18 0232  HGB 9.1*  --   --   HCT 27.9*  --   --   PLT 165  --   --   CREATININE 1.86* 1.88* 1.70*    Estimated Creatinine Clearance: 51.1 mL/min (A) (by C-G formula based on SCr of 1.7 mg/dL (H)).   Medical History: Past Medical History:  Diagnosis Date  . Arthritis    "fingers, right hip" (11/07/2014)  . Complication of anesthesia    "trembling during my 1st cath"  . Coronary artery disease   . Easy bruising   . GERD (gastroesophageal reflux disease)   . Headache    "monthly" (11/07/2014)  . 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 History of stomach ulcers   . Hyperlipidemia   . Hypertension   . Hypothyroidism   . Myocardial infarction (HCC) 03/2014  . PONV (postoperative nausea and vomiting)   . Stroke [facility])    questionably noted on a MRI  . Trouble swallowing   . Type II diabetes mellitus (HCC)   . Vertigo    Assessment:  73 yr old male to resume Eliquis for atrial fibrillation.  POD#5 CABG, was on Eliquis until prior to cardiac cath on 06/05/18.   Last dose 3/8 am.       Creatinine is above baseline and >1.5, but 72 yrs old and 106.7 kg, so no dose reduction indicated.  Has been on  Aspirin 325 mg daily post-op, but to decrease back to 81 mg at discharge.  Goal of Therapy:  appropriate Eliquis dose for indication   Plan:   Eliquis 5 mg PO BID.  Discussed with patient.  Dennie Fetters, RPh Pager: [phone] or phone: [phone] 06/14/2018,9:16 AM

## 2018-06-14 NOTE — Progress Notes (Signed)
Occupational Therapy Treatment Patient Details Name: Patrick Duran MRN: [id number] DOB: 15-May-1945 Today's Date: 06/14/2018    History of present illness Pt is a 73 y.o. M with significant PMH of CAD, multiple prior percutaneous interventions, hypertension, type 2 DM, vertigo. Presents with substernal chest pain associated with nausea. Now s/p CABG x 1 3/13.   OT comments  Pt in the midst of leaving AMA - fully dressed awaiting wife/ride. Pt was very agreeable to session and further education on sternal precautions with ADL focus. Pt able to complete transfers at min guard, education for safety with rear peri care, able to complete grooming tasks leaning against sink. HR was 101 in seated position prior to transfers. Also educated on use of 3 in 1 as BSC, toilet riser, and shower chair. Wife present at the end. All questions and concerns answered concerning ADL. Current POC remains essentail - Pt requires HHOT for safety and to maximize independence in ADL and functional transfers.    Follow Up Recommendations  Home health OT    Equipment Recommendations  3 in 1 bedside commode    Recommendations for Other Services      Precautions / Restrictions Precautions Precautions: Sternal;Fall Precaution Booklet Issued: No Precaution Comments: verbally reviewed with pt and pt wife Restrictions Weight Bearing Restrictions: (sternal precautions) Other Position/Activity Restrictions: sternal precautions       Mobility Bed Mobility               General bed mobility comments: OOB in the recliner  Transfers Overall transfer level: Needs assistance Equipment used: None Transfers: Sit to/from Stand Sit to Stand: Min guard         General transfer comment: Cues for hand placement to maintain sternal precautions with visual demonstration    Balance Overall balance assessment: Needs assistance Sitting-balance support: Feet supported Sitting balance-Leahy Scale: Good      Standing balance support: No upper extremity supported;During functional activity Standing balance-Leahy Scale: Fair Standing balance comment: short ambulation without DME at close min guard                           ADL either performed or assessed with clinical judgement   ADL Overall ADL's : Needs assistance/impaired     Grooming: Wash/dry face;Wash/dry hands;Set up;Standing Grooming Details (indicate cue type and reason): min guard for balance safety, PT leans against the sink                 Toilet Transfer: Min guard;Ambulation Toilet Transfer Details (indicate cue type and reason): min guard, vc for safe hand placement with sternal precautions Toileting- Clothing Manipulation and Hygiene: Min guard;Sit to/from stand Toileting - Clothing Manipulation Details (indicate cue type and reason): able to perform raer peri care Tub/ Shower Transfer: Walk-in shower;Min guard;Ambulation Tub/Shower Transfer Details (indicate cue type and reason): educated wife in use of 3 in 1 as shower seat   General ADL Comments: Pt in the process of leaving AMA, Pt and wife did not have any questions or concerns about maintaining sternal precautions with functional ADL tasks, re-educated on staying in the tube, smart seating choices     Vision       Perception     Praxis      Cognition Arousal/Alertness: Awake/alert Behavior During Therapy: WFL for tasks assessed/performed Overall Cognitive Status: Within Functional Limits for tasks assessed  Exercises     Shoulder Instructions       General Comments pulse was 101 with pulse-ox prior to transfer to transport chair    Pertinent Vitals/ Pain       Pain Assessment: Faces Faces Pain Scale: Hurts a little bit Pain Location: incisional with coughing Pain Descriptors / Indicators: Operative site guarding Pain Intervention(s): Monitored during session;Other  (comment)(pillow to hug for coughing)  Home Living                                          Prior Functioning/Environment              Frequency  Min 2X/week        Progress Toward Goals  OT Goals(current goals can now be found in the care plan section)  Progress towards OT goals: Progressing toward goals  Acute Rehab OT Goals Patient Stated Goal: go home OT Goal Formulation: With patient Time For Goal Achievement: 06/26/18 Potential to Achieve Goals: Good  Plan Discharge plan remains appropriate;Frequency remains appropriate    Co-evaluation                 AM-PAC OT "6 Clicks" Daily Activity     Outcome Measure   Help from another person eating meals?: None Help from another person taking care of personal grooming?: A Little Help from another person toileting, which includes using toliet, bedpan, or urinal?: A Lot Help from another person bathing (including washing, rinsing, drying)?: A Lot Help from another person to put on and taking off regular upper body clothing?: A Little Help from another person to put on and taking off regular lower body clothing?: A Lot 6 Click Score: 16    End of Session    OT Visit Diagnosis: Unsteadiness on feet (R26.81);Pain;Other symptoms and signs involving cognitive function Pain - Right/Left: (central) Pain - part of body: (sternum)   Activity Tolerance Patient tolerated treatment well   Patient Left Other (comment)(in transport chair leaving the hospital)   Nurse Communication Mobility status        Time: 4081-4481 OT Time Calculation (min): 12 min  Charges: OT General Charges $OT Visit: 1 Visit OT Treatments $Self Care/Home Management : 8-22 mins  Sherryl Manges OTR/L Acute Rehabilitation Services Pager: [phone] Office: [phone]   Evern Bio Leny Morozov 06/14/2018, 11:34 AM

## 2018-06-14 NOTE — Progress Notes (Signed)
I again spoke with the patient about remaining in the hospital for further observation and to obtain better control of his heart rate.  He has removed his telemetry pack and is adamant he is going home.  Him and is wife believe his heart rate is elevated due to his agitation of not being told he was going home today.  I again re-iterated that Atrial Fibrillation is a cardiac arrhythmia.  I am aware the patient has a history of Atrial fibrillation, but currently his is experiencing an elevated HR with RVR.  Due to this he is at increased risk of cardiac death due to additional arrhythmia and increased risk of stroke.   It was again re-iterated it is against medical advise that he leaves this hospital.  He is aware of the risk and remains adamant on discharge home today.  Denny Peon Angell Honse PA-C [phone]

## 2018-06-14 NOTE — Progress Notes (Signed)
PT Cancellation Note  Patient Details Name: Patrick Duran MRN: [id number] DOB: [birth year]   Cancelled Treatment:    Reason Eval/Treat Not Completed: Medical issues which prohibited therapy per chart review patient in A-fib with RVR and HR 131BPM in flowsheets, up to 147BPM noted on tele-monitor in hallway. Holding PT today due to medical status, will attempt to try to return on next day of service if patient is medically appropriate.    Nedra Hai PT, DPT, CBIS  Supplemental Physical Therapist [facility]    Pager [phone] Acute Rehab Office [phone]

## 2018-06-14 NOTE — Progress Notes (Addendum)
[address]       [place] [address]             [phone]     5 Days Post-Op Procedure(s) (LRB): CORONARY ARTERY BYPASS GRAFTING (CABG) x one using radial artery (N/A) CLIPPING OF ATRIAL APPENDAGE using AtriCure Clip size 35 (N/A) RADIAL ARTERY HARVEST (Left) TRANSESOPHAGEAL ECHOCARDIOGRAM (TEE) (N/A)   Subjective:  Patient developed rapid Atrial Fibrillation overnight.  Treated with IV lopressor and HR calmed down.  He is back in Atrial Fibrillation with RVR this morning.  Patient is adamant about discharge.  I explained to patient that I would advise him to stay in the hospital until his HR can be under better control.  I explained that he is at an increased risk of stroke as his HR is elevated.  He states he has had Atrial fibrillation and he is going home.  I stated he would be leaving hospital against medical advice.  Objective: Vital signs in last 24 hours: Temp:  [97.7 F (36.5 C)-98.2 F (36.8 C)] 97.7 F (36.5 C) (03/18 0525) Pulse Rate:  [85-131] 131 (03/18 0525) Cardiac Rhythm: Normal sinus rhythm (03/17 2000) Resp:  [13-25] 22 (03/18 0525) BP: (124-151)/(62-84) 124/69 (03/18 0525) SpO2:  [96 %-98 %] 98 % (03/18 0525) Weight:  [106.7 kg] 106.7 kg (03/18 0525)  Intake/Output from previous day: 03/17 0701 - 03/18 0700 In: 960 [P.O.:960] Out: 1250 [Urine:1250]  General appearance: alert and agitated Heart: irregularly irregular rhythm Lungs: clear to auscultation bilaterally Abdomen: soft, non-tender; bowel sounds normal; no masses,  no organomegaly Extremities: edema trace Wound: clean and dry  Lab Results: Recent Labs    06/12/18 0245  WBC 7.8  HGB 9.1*  HCT 27.9*  PLT 165   BMET:  Recent Labs    06/13/18 0303 06/14/18 0232  NA 137 138  K 3.6 3.3*  CL 106 105  CO2 23 27  GLUCOSE 89 92  BUN 29* 27*  CREATININE 1.88* 1.70*  CALCIUM 8.8* 8.8*    PT/INR: No results for input(s): LABPROT, INR in the last 72 hours. ABG     Component Value Date/Time   PHART 7.338 (L) 06/09/2018 1853   HCO3 21.1 06/09/2018 1853   TCO2 22 06/09/2018 1853   ACIDBASEDEF 4.0 (H) 06/09/2018 1853   O2SAT 98.0 06/09/2018 1853   CBG (last 3)  Recent Labs    06/13/18 1606 06/13/18 2152 06/14/18 0630  GLUCAP 139* 128* 126*    Assessment/Plan: S/P Procedure(s) (LRB): CORONARY ARTERY BYPASS GRAFTING (CABG) x one using radial artery (N/A) CLIPPING OF ATRIAL APPENDAGE using AtriCure Clip size 35 (N/A) RADIAL ARTERY HARVEST (Left) TRANSESOPHAGEAL ECHOCARDIOGRAM (TEE) (N/A)  1. CV- Atrial Fibrillation with RVR- will continue Lopressor, as patient is signing out AMA today, will start oral Amiodarone at 200 mg BID, Imdur for radial graft. Will likely start Xarelto 2. Pulm- no acute issues, continue IS 3. Renal- creatinine has improved down to 1.70, hypokalemia will supplement 4. DM- sugars well controlled 5. Dispo- patient with Atrial Fibrillation with RVR this morning, refusing Amiodarone drip, states he is going home today, explained this will be against medical advice.  Will speak with Dr. Dorris Fetch in regards to starting Anticoagulation, will start oral Amiodarone, will provide medications and AMA paperwork today if patient is unwilling to stay for treatment    LOS: 5 days    [name] 06/14/2018 Patient seen and examined, agree with above He is adamant about leaving, I was  equally adamant about him staying until his HR is under control. Will resume Eliquis  [name], MD [facility] [phone]

## 2018-06-15 ENCOUNTER — Other Ambulatory Visit: Payer: Self-pay

## 2018-06-15 ENCOUNTER — Encounter (HOSPITAL_COMMUNITY): Payer: Self-pay | Admitting: Thoracic Surgery (Cardiothoracic Vascular Surgery)

## 2018-06-15 NOTE — Patient Outreach (Signed)
  [facility]) Care Management Chronic Special Needs Program   06/15/2018  Name: Patrick Duran, DOB: 12-Aug-1945  MRN: [id number]  The client was discussed in today's interdisciplinary care team meeting.  The following issues were discussed: Client discharged to home 06/13/17 against medical advice.   Changes in health status, Key risk triggers/risk stratification, Coordination of care, Care transitions and Issues/barriers to care  Participants present:  Utilization management: Westley Chandler; Hilbert Odor; Shawna Ray; Kathyrn Sheriff, RNCM  Recommendations/Plan: transition of care per [facility] management team process. RNCM will continue to follow.  Kathyrn Sheriff, RN, MSN, [facility] Chronic Care Management Coordinator [facility] [phone]

## 2018-06-16 ENCOUNTER — Telehealth (HOSPITAL_COMMUNITY): Payer: Self-pay | Admitting: *Deleted

## 2018-06-16 ENCOUNTER — Other Ambulatory Visit: Payer: Self-pay

## 2018-06-16 ENCOUNTER — Telehealth (HOSPITAL_COMMUNITY): Payer: Self-pay

## 2018-06-16 ENCOUNTER — Encounter (HOSPITAL_COMMUNITY): Payer: Self-pay | Admitting: Thoracic Surgery (Cardiothoracic Vascular Surgery)

## 2018-06-16 NOTE — Patient Outreach (Addendum)
  [facility]) Care Management Chronic Special Needs Program  06/16/2018  Name: Patrick Duran DOB: 1945-09-03  MRN: [id number]  [name] is enrolled in a chronic special needs plan for Diabetes. Reviewed and updated care plan.  Admitted 3/8-3/11 with chest pain. Readmitted for CABG 3/13 - 3/18. Discharged home with wife. Client's wife contacted 24 hour nurse advice line  Per nurse advice line: "incision on arm red and angry looking(puffy)". Recommended to nurse advice line to follow up with surgeon within 24 hours.   RNCM called to follow up. Client was sleeping, RNCM spoke with wife(consented per DPR) on yesterday regarding. She reports "its just a little red, it is not infected." She reports his chest incision "looks fine". Mrs. Seyer reports it does not warrant calling the surgeon at this time. She states if there is any worsening she will call.  Mrs. Quigley reports she is in communication with Rhonda(utilization management), another nurse from the team and states she is in the process of calling her back.  Goals Addressed            This Visit's Progress   . Client understands the importance of follow-up with providers by attending scheduled visits   On track   . Client verbalizes knowledge of Heart Attack self management skills within the next 3-6 months.    On track   . Client will not have any unplanned  admissions within 30 days (C-SNP)   On track    Please follow discharge instruction as recommended. Please schedule and attend all follow up appointments as recommended. [employer] 24 hour nurse advice line [phone].    Marland Kitchen Client will report no worsening of symptoms of Atrial Fibrillation within the next 3-6 months.   On track   . Client will report no worsening of symptoms related to heart disease within the next 3-6 months.   On track   . Client will use Assistive Devices as needed and verbalize understanding of device use   On track    . Client will verbalize knowledge of self management of Hypertension as evidences by BP reading of 140/90 or less; or as defined by provider   On track   . Maintain timely refills of diabetic medication as prescribed within the year .   On track   . Obtain annual  Lipid Profile, LDL-C   On track   . Obtain Annual Eye (retinal)  Exam    On track   . Obtain Annual Foot Exam   On track   . Obtain annual screen for micro albuminuria (urine) , nephropathy (kidney problems)   On track   . Obtain Hemoglobin A1C at least 2 times per year   On track   . Visit Primary Care Provider or Endocrinologist at least 2 times per year    On track     [facility] reviewed signs/symtoms of infection. RNCM encouraged Mrs. Nyarko to contact surgeon for any questions regarding the wound and care of wound.  Plan: update primary care and surgeon. Chronic care management coordinator will continue to follow. Continue care coordination with interdisciplinary team.     Kathyrn Sheriff, RN, MSN, [facility] Chronic Care Management Coordinator [facility] [phone]   .

## 2018-06-16 NOTE — Telephone Encounter (Signed)
Pt wife returned call.  Pt is interested in participating in Cardiac Rehab. Advised of departmental closure due to Covid-19.  He will be contacted to schedule when appropriate. Alanson Aly, BSN Cardiac and Emergency planning/management officer

## 2018-06-16 NOTE — Telephone Encounter (Signed)
Pt insurance is active and benefits verified through [employer]. Co-pay $0.00, DED $0.00/$0.00 met, out of pocket $5,000.00/$0.00 met, co-insurance 0%. No pre-authorization required. Lawanda/[facility], 06/16/2018 @ 10:16AM, REF# [id number]  Will contact patient to see if he is interested in the Cardiac Rehab Program. If interested, patient will need to complete follow up appt. Once completed, patient will be contacted for scheduling upon review by the RN Navigator.

## 2018-06-16 NOTE — Telephone Encounter (Signed)
Attempted to call patient in regards to Cardiac Rehab - to let pt know we are closed at this time due to the COVID-19 and we have recv'd his referral. Will contact once we have resume scheduling.   LMTCB

## 2018-06-19 ENCOUNTER — Telehealth: Payer: Self-pay

## 2018-06-19 NOTE — Telephone Encounter (Signed)
Spouse aware of JG instructions and that it is ok to take antihistamines as long as its not Sudafed D.//ah

## 2018-06-19 NOTE — Telephone Encounter (Signed)
Spouse Meriam Sprague called stating that pt had a Bypass on 3/13. He was prescribed tramadol. He just took the first one last night and it is causing him dizziness and N/V. Spouse is concerned with the vomiting with the wound on his chest. Wants to know if he can be prescribed something different. Also can he take antihistamines. He is having a runny nose and coughing. Please advise.//ah

## 2018-06-19 NOTE — Telephone Encounter (Signed)
It is a side effect. Ask him to just take plain tylenol extra strength  1-2 tablets 8 hours as needed

## 2018-06-21 ENCOUNTER — Telehealth: Payer: Self-pay | Admitting: Family Medicine

## 2018-06-21 NOTE — Telephone Encounter (Signed)
Copied from CRM [id number]. Topic: Quick Communication - See Telephone Encounter >> Jun 21, 2018  3:41 PM Terisa Starr wrote: CRM for notification. See Telephone encounter for: 06/21/18.  Rhonda RN with [employer] called and said the patient left AMA from the hospital on 3/18 and had open heart surgery. She said she has been talking to his wife & he does not want his wife talking to her anymore. She is just concerned and thought that Dr Jimmey Ralph should be aware of this. She said this is just an [place]. Please advise.

## 2018-06-21 NOTE — Telephone Encounter (Signed)
See note

## 2018-06-22 NOTE — Telephone Encounter (Signed)
FYI

## 2018-06-30 ENCOUNTER — Encounter: Payer: Self-pay | Admitting: Cardiology

## 2018-06-30 ENCOUNTER — Other Ambulatory Visit: Payer: Self-pay

## 2018-06-30 ENCOUNTER — Ambulatory Visit (INDEPENDENT_AMBULATORY_CARE_PROVIDER_SITE_OTHER): Payer: HMO | Admitting: Cardiology

## 2018-06-30 VITALS — BP 132/82 | HR 68 | Ht 73.0 in | Wt 226.0 lb

## 2018-06-30 DIAGNOSIS — N183 Chronic kidney disease, stage 3 unspecified: Secondary | ICD-10-CM

## 2018-06-30 DIAGNOSIS — I25119 Atherosclerotic heart disease of native coronary artery with unspecified angina pectoris: Secondary | ICD-10-CM

## 2018-06-30 DIAGNOSIS — Z951 Presence of aortocoronary bypass graft: Secondary | ICD-10-CM | POA: Diagnosis not present

## 2018-06-30 DIAGNOSIS — I480 Paroxysmal atrial fibrillation: Secondary | ICD-10-CM

## 2018-06-30 DIAGNOSIS — E1122 Type 2 diabetes mellitus with diabetic chronic kidney disease: Secondary | ICD-10-CM | POA: Diagnosis not present

## 2018-06-30 MED ORDER — AMIODARONE HCL 200 MG PO TABS: 200.0000 mg | ORAL_TABLET | Freq: Every day | ORAL | 1 refills | Status: DC

## 2018-06-30 NOTE — Progress Notes (Signed)
Subjective:  Primary Physician:  Ardith Dark, MD  Patient ID: Patrick Duran, male    DOB: 10/23/1945, 73 y.o.   MRN: [id number]  Chief Complaint  Patient presents with  . Coronary Artery Disease  . Follow-up    HPI: Patrick Duran  is a 73 y.o. male  with CAD, leg edema, DM and PAF. Patrick Duran presented on 11/06/2014 with acute coronary syndrome and underwent angioplasty for instent restenosis of the mid to distal RCA placed on 04/15/2014 and had sustained atrial fibrillation with RVR at the same time. Patrick Duran has history of hypertension, Benign positional vertigo, chronic leg edema and venous claudication, hyperlipidemia and diabetes mellitus with stage 3 CKD.  Patrick Duran presented to the hospital on 06/04/2018 with non-ST elevation myocardial infarction, repeat angiography revealed recurrent restenosis in the right coronary artery.  Patrick Duran underwent one-vessel CABG on 06/09/2018 with free right radial graft to RCA.  Also underwent modified maze procedure followed by left atrial appendage clipping.  Patrick Duran is presently doing well and uses a cane for support and no dizziness or chest pain. No palpitations.  Patrick Duran has had insomnia due to chest wall pain, unable to use pain meds due to nausea. Weight has remained stable. No orthopnea.   Past Medical History:  Diagnosis Date  . Arthritis    "fingers, right hip" (11/07/2014)  . Complication of anesthesia    "trembling during my 1st cath"  . Coronary artery disease   . Easy bruising   . GERD (gastroesophageal reflux disease)   . Headache    "monthly" (11/07/2014)  . 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 History of stomach ulcers   . Hyperlipidemia   . Hypothyroidism   . Myocardial infarction (HCC) 03/2014  . PONV (postoperative nausea and vomiting)   . Stroke [facility])    questionably noted on a MRI  . Trouble swallowing   . Type II diabetes mellitus (HCC)   . Vertigo     Past Surgical  History:  Procedure Laterality Date  . ANTERIOR CERVICAL DECOMP/DISCECTOMY FUSION  2004  . CARDIAC CATHETERIZATION  2000  . CARDIAC CATHETERIZATION  04/15/2014   Procedure: CORONARY STENT INTERVENTION;  Surgeon: Pamella Pert, MD;  Location[facility] CATH LAB;  Service: Cardiovascular;;  RCA  . CARDIAC CATHETERIZATION N/A 11/07/2014   Procedure: Left Heart Cath and Coronary Angiography;  Surgeon: Yates Decamp, MD;  Location: [facility] INVASIVE CV LAB;  Service: Cardiovascular;  Laterality: N/A;  . CLIPPING OF ATRIAL APPENDAGE N/A 06/09/2018   Procedure: CLIPPING OF ATRIAL APPENDAGE using AtriCure Clip size 35;  Surgeon: Loreli Slot, MD;  Location: [facility] OR;  Service: Open Heart Surgery;  Laterality: N/A;  . CORONARY ANGIOGRAPHY N/A 06/05/2018   Procedure: CORONARY ANGIOGRAPHY;  Surgeon: Elder Negus, MD;  Location: [facility] INVASIVE CV LAB;  Service: Cardiovascular;  Laterality: N/A;  . CORONARY ANGIOPLASTY WITH STENT PLACEMENT  2000; 11/07/2014   "3 stents; 1 stent"  . CORONARY ARTERY BYPASS GRAFT N/A 06/09/2018   Procedure: CORONARY ARTERY BYPASS GRAFTING (CABG) x one using radial artery;  Surgeon: Loreli Slot, MD;  Location: [facility] OR;  Service: Open Heart Surgery;  Laterality: N/A;  . LAPAROSCOPIC CHOLECYSTECTOMY  1998  . LEFT HEART CATHETERIZATION WITH CORONARY ANGIOGRAM N/A 04/15/2014   Procedure: LEFT HEART CATHETERIZATION WITH CORONARY ANGIOGRAM;  Surgeon: Pamella Pert, MD;  Location: [facility] CATH LAB;  Service: Cardiovascular;  Laterality: N/A;  . RADIAL ARTERY HARVEST Left 06/09/2018   Procedure: RADIAL ARTERY HARVEST;  Surgeon: Loreli Slot, MD;  Location: [facility] OR;  Service: Open Heart Surgery;  Laterality: Left;  . TEE WITHOUT CARDIOVERSION N/A 06/09/2018   Procedure: TRANSESOPHAGEAL ECHOCARDIOGRAM (TEE);  Surgeon: Loreli Slot, MD;  Location: [facility] OR;  Service: Open Heart Surgery;  Laterality: N/A;    Social History   Socioeconomic History  . Marital status: Married     Spouse name: Not on file  . Number of children: 0  . Years of education: Not on file  . Highest education level: Not on file  Occupational History  . Not on file  Social Needs  . Financial resource strain: Not on file  . Food insecurity:    Worry: Not on file    Inability: Not on file  . Transportation needs:    Medical: Not on file    Non-medical: Not on file  Tobacco Use  . Smoking status: Former Smoker    Packs/day: 1.50    Years: 34.00    Pack years: 51.00    Types: Cigarettes  . Smokeless tobacco: Never Used  . Tobacco comment: "stopped smoking in ~ 2000"  Substance and Sexual Activity  . Alcohol use: No  . Drug use: No    Comment: 11/07/2014 "experimented in the 1960's and 1970's; nothing since"  . Sexual activity: Not Currently  Lifestyle  . Physical activity:    Days per week: Not on file    Minutes per session: Not on file  . Stress: Not on file  Relationships  . Social connections:    Talks on phone: Not on file    Gets together: Not on file    Attends religious service: Not on file    Active member of club or organization: Not on file    Attends meetings of clubs or organizations: Not on file    Relationship status: Not on file  . Intimate partner violence:    Fear of current or ex partner: Not on file    Emotionally abused: Not on file    Physically abused: Not on file    Forced sexual activity: Not on file  Other Topics Concern  . Not on file  Social History Narrative  . Not on file    Current Outpatient Medications on File Prior to Visit  Medication Sig Dispense Refill  . acetaminophen (TYLENOL) 500 MG tablet Take 500 mg by mouth every 6 (six) hours as needed. For pain    . Alpha-Lipoic Acid 300 MG CAPS Take by mouth daily.      Marland Kitchen amiodarone (PACERONE) 200 MG tablet Take 1 tablet (200 mg total) by mouth 2 (two) times daily. 60 tablet 1  . apixaban (ELIQUIS) 5 MG TABS tablet Take 1 tablet (5 mg total) by mouth 2 (two) times daily. 60 tablet 3  .  aspirin EC 81 MG EC tablet Take 1 tablet (81 mg total) by mouth daily. 30 tablet 0  . atorvastatin (LIPITOR) 40 MG tablet Take 1 tablet (40 mg total) by mouth daily. 90 tablet 3  . co-enzyme Q-10 30 MG capsule Take 120 mg by mouth daily.      . diazepam (VALIUM) 5 MG tablet Take 1 tablet (5 mg total) by mouth every 6 (six) hours as needed for anxiety. 30 tablet 0  . dimenhyDRINATE (DRAMAMINE) 50 MG tablet Take 50 mg by mouth every 8 (eight) hours as needed.    Marland Kitchen  fenofibrate micronized (LOFIBRA) 134 MG capsule Take 1 capsule (134 mg total) by mouth daily before breakfast. 90 capsule 3  . furosemide (LASIX) 40 MG tablet Take 1 tablet (40 mg total) by mouth daily. 7 tablet 0  . glipiZIDE (GLUCOTROL XL) 10 MG 24 hr tablet Take 1 tablet (10 mg total) by mouth 2 (two) times daily. (Patient taking differently: Take 20 mg by mouth daily with breakfast. ) 180 tablet 3  . Insulin Glargine (LANTUS) 100 UNIT/ML Solostar Pen Inject 20 Units into the skin as directed. 15-35 units q a.m. (Patient taking differently: Inject 15-35 Units into the skin as directed. 15-35 units q a.m.) 15 mL 11  . isosorbide mononitrate (IMDUR) 30 MG 24 hr tablet Take 1 tablet (30 mg total) by mouth daily. 30 tablet 0  . levothyroxine (SYNTHROID, LEVOTHROID) 125 MCG tablet Take 1 tablet (125 mcg total) by mouth daily before breakfast. 90 tablet 3  . meclizine (ANTIVERT) 12.5 MG tablet Take 1 tablet (12.5 mg total) by mouth 3 (three) times daily as needed for dizziness. 30 tablet 3  . metoprolol tartrate (LOPRESSOR) 25 MG tablet Take 1 tablet (25 mg total) by mouth 2 (two) times daily. 60 tablet 3  . Multiple Vitamin (MULTIVITAMIN PO) Take 1 tablet by mouth daily.     . niacin 100 MG tablet Take 100 mg by mouth daily.    . pantoprazole (PROTONIX) 40 MG tablet Take 1 tablet (40 mg total) by mouth daily. 90 tablet 3  . POTASSIUM GLUCONATE PO Take 1 tablet by mouth daily.     . tamsulosin (FLOMAX) 0.4 MG CAPS capsule Take 1 capsule (0.4 mg  total) by mouth daily. 30 capsule 3  . vitamin B-12 (CYANOCOBALAMIN) 100 MCG tablet Take 100 mcg by mouth daily.     . Zinc Acetate, Oral, (ZINC ACETATE PO) Take 1 tablet by mouth daily.     . traMADol (ULTRAM) 50 MG tablet Take 1-2 tablets (50-100 mg total) by mouth every 4 (four) hours as needed for moderate pain. 30 tablet 0   No current facility-administered medications on file prior to visit.     Review of Systems  Constitution: Negative for chills, decreased appetite, malaise/fatigue and weight gain.  Eyes: Negative for visual disturbance.  Cardiovascular: Positive for claudication (right leg pain with ambulation and has cervical neck surgiery years ago) and leg swelling. Negative for dyspnea on exertion and syncope.  Endocrine: Negative for cold intolerance.  Hematologic/Lymphatic: Does not bruise/bleed easily.  Musculoskeletal: Negative for joint swelling.  Gastrointestinal: Negative for abdominal pain, anorexia and change in bowel habit.  Neurological: Positive for dizziness (has chronic vertigo and stable). Negative for headaches, light-headedness and numbness.  Psychiatric/Behavioral: Negative for depression and substance abuse.  All other systems reviewed and are negative.     Objective:  Blood pressure 132/82, pulse 68, height 6\' 1"  (1.854 m), weight 226 lb (102.5 kg). Body mass index is 29.82 kg/m.  Physical Exam  Constitutional: Patrick Duran is oriented to person, place, and time. Patrick Duran appears well-nourished.  Mildly obese  Pulmonary/Chest: Effort normal.  CABG scan well healed  Neurological: Patrick Duran is alert and oriented to person, place, and time.  Skin: Skin is dry.  Psychiatric: Patrick Duran has a normal mood and affect.    Radiology: No results found.  Laboratory Examination:  CMP Latest Ref Rng & Units 06/14/2018 06/13/2018 06/12/2018  Glucose 70 - 99 mg/dL 92 89 92  BUN 8 - 23 mg/dL 16(S) 06(T) 01(S)  Creatinine 0.61 - 1.24  mg/dL 1.61(W) 9.60(A) 5.40(J)  Sodium 135 - 145 mmol/L  138 137 136  Potassium 3.5 - 5.1 mmol/L 3.3(L) 3.6 3.6  Chloride 98 - 111 mmol/L 105 106 108  CO2 22 - 32 mmol/L Calcium 8.9 - 10.3 mg/dL 8.1(X) 9.1(Y) 7.8(G)  Total Protein 6.5 - 8.1 g/dL - - -  Total Bilirubin 0.3 - 1.2 mg/dL - - -  Alkaline Phos 38 - 126 U/L - - -  AST 15 - 41 U/L - - -  ALT 0 - 44 U/L - - -   CBC Latest Ref Rng & Units 06/12/2018 06/11/2018 06/10/2018  WBC 4.0 - 10.5 K/uL 7.8 7.4 8.0  Hemoglobin 13.0 - 17.0 g/dL 9.5(A) 2.1(H) 0.8(M)  Hematocrit 39.0 - 52.0 % 27.9(L) 28.5(L) 29.3(L)  Platelets 150 - 400 K/uL 165 153 166   Lipid Panel     Component Value Date/Time   CHOL 112 06/06/2018 0515   TRIG 139 06/06/2018 0515   HDL 23 (L) 06/06/2018 0515   CHOLHDL 4.9 06/06/2018 0515   VLDL 28 06/06/2018 0515   LDLCALC 61 06/06/2018 0515   LDLDIRECT 106.6 10/12/2007 0906   HEMOGLOBIN A1C Lab Results  Component Value Date   HGBA1C 5.8 (H) 06/06/2018   MPG 119.76 06/06/2018   TSH Recent Labs    06/05/18 1433  TSH 1.426    CARDIAC STUDIES:   Coronary Angiography 06/05/2018 LM: Normal. LAD: Prox 30%, mid 40% focal stenoses. LCx: Distal 40% focal stenosis. RCA: Local 90% ISR in prox-mid, mid RCA. Distal RCA 70% edge restenosis. Diffuse 80% ISR in distal RCA  Echocardiogram 06/05/2018 : 1. The left ventricle has normal systolic function, with an ejection fraction of 55-60%. Inferior hypokinesis. The cavity size was normal. There is mild concentric left ventricular hypertrophy. Left ventricular diastolic Doppler parameters are consistent with pseudonormalization.  2. The right ventricle has normal systolic function. The cavity was normal. There is no increase in right ventricular wall thickness.  3. Left atrial size was moderately dilated.  4. Right atrial size was mildly dilated.     Assessment:    Coronary artery disease involving native coronary artery of native heart with angina pectoris (HCC)  Hx of CABG 06/09/2018 x 1 Free Radial graft to  RCA, modified Maze procedure, clipping LAA with  AtriCure Clip size 35.  Paroxysmal atrial fibrillation (HCC) - Plan: amiodarone (PACERONE) 200 MG tablet  Controlled type 2 diabetes mellitus with stage 3 chronic kidney disease, without long-term current use of insulin (HCC)   EKG 06/10/2018: Normal sinus rhythm at rate of 72 bpm, left axis deviation, cannot exclude inferior infarct old.  T wave inversion in the inferior leads cannot exclude ischemia.  Prominent R in V2, cannot exclude true posterior infarct old.  Normal QT interval.  Recommendation:   Patrick Duran is presently doing well, No recurrence of angina pectoris, no dyspnea or PND or orthopnea,sternotomy site has healed fairly well at the 3rd weeks and CABG..  Blood pressure is well controlled, No change/gaining weight, encouraged him to continue to lose more weight.  Blood sugars are controlled at home.  I reduce the dose of amiodarone from 200 mg b.i.d. to 200 mg daily.  Patrick Duran is otherwise on appropriate medical therapy including Eliquis for PAF and aspirin 81 mg daily along with isosorbide mononitrate for radial graft for his RCA.  I'll see him back in 4 weeks for follow-up. Patrick Duran left the hospital AMA in A. Fib, but patient states HR is controlled and  Patrick Duran feels well. Patrick Duran will need EKG on his follow up, weight loss discussed and DM control importance discussed.   Yates Decamp, MD, [facility] 06/30/2018, 11:06 AM [facility] [place] Pager: [phone] Office: [phone] If no answer Cell [phone]

## 2018-07-04 ENCOUNTER — Telehealth: Payer: Self-pay | Admitting: Physician Assistant

## 2018-07-04 NOTE — Telephone Encounter (Signed)
      [address]       [place] [address]             [phone]    [place] ROPP [id number]   S/P CABG x 1, Modified MAZE performed on 06/10/2018.  Discharged home on 06/14/2018  Medications: Amiodarone has been decreased to 200 mg daily,  Continue Imdur will stop once prescription runs out.   Problems/Concerns: None, patient states he is doing very well.  He is ambulating without difficulty.  He states that he is actually lost about 10 lbs since hospital discharge.  He denies any episodes of increase HR and states this has been stable since discharge.  His appetite has recovered.  He is no longer utilizing pain medication.  His incisions are healed without evidence of infection.  Assessment:  Patient is doing well.  Further recovery instructions were provided.  He wishes to hold off on Cardiac rehab at this time.  He will discuss this further at his follow up appointment and decide if he wishes to proceed depending on how he is doing.  He was instructed to contact office if concerns or problems develop  Follow up Appointment:   3-4 weeks office

## 2018-07-11 ENCOUNTER — Ambulatory Visit: Payer: HMO

## 2018-07-26 ENCOUNTER — Other Ambulatory Visit: Payer: Self-pay

## 2018-07-26 NOTE — Patient Outreach (Signed)
  [facility]) Care Management Chronic Special Needs Program  07/26/2018  Name: Patrick Duran DOB: [birth year]  MRN: [id number]  [name] is enrolled in a chronic special needs plan for Diabetes. RNCM called to follow up; attempt to complete Health Risk Assessment and update individualized care plan.   Subjective: RNCM spoke with client's wife who is also designated party release. She reports client is not home and does not want to participate in the care management program. Mrs. Wenzinger allowed a few minutes of her time for [facility] to discuss care management services/role in health care plan. RNCM reinforced care management services is a benefit of his health plan. She states client has no needs now and is doing well. She states he has recovered from the surgery. She agreed to a call back from [facility] in June to assess potential for reaching "donut hole". Does not want a call prior to June. RNCM emphasized client will be receiving educational material from the plan quarterly. Mrs. Nephew confirmed she has RNCM's contact number to call as needed. Denies any questions regarding COVID-19.   Goals Addressed            This Visit's Progress   . Client understands the importance of follow-up with providers by attending scheduled visits   On track   . Client verbalizes knowledge of Heart Attack self management skills within the next 3-6 months.    On track   . COMPLETED: Client will not have any unplanned  admissions within 30 days (C-SNP)       Please follow discharge instruction as recommended. Please schedule and attend all follow up appointments as recommended. [employer] 24 hour nurse advice line [phone].    Marland Kitchen Client will report no worsening of symptoms of Atrial Fibrillation within the next 3-6 months.   On track   . Client will report no worsening of symptoms related to heart disease within the next 3-6 months.   On track   . Client will use Assistive  Devices as needed and verbalize understanding of device use   On track   . Client will verbalize knowledge of self management of Hypertension as evidences by BP reading of 140/90 or less; or as defined by provider   On track   . Maintain timely refills of diabetic medication as prescribed within the year .   On track   . Obtain annual  Lipid Profile, LDL-C   On track   . Obtain Annual Eye (retinal)  Exam    On track   . Obtain Annual Foot Exam   On track   . Obtain annual screen for micro albuminuria (urine) , nephropathy (kidney problems)   On track   . Obtain Hemoglobin A1C at least 2 times per year   On track   . Visit Primary Care Provider or Endocrinologist at least 2 times per year    On track     COVID-19 precautions discussed. Currently per Mrs. Toruno, client does not want to participate in Care management Service, but request a call back in June.  Plan:  Reach out to client in June.  Kathyrn Sheriff, RN, MSN, [facility] Chronic Care Management Coordinator [facility] [phone]   .

## 2018-07-28 ENCOUNTER — Ambulatory Visit: Payer: HMO | Admitting: Cardiology

## 2018-07-28 ENCOUNTER — Other Ambulatory Visit: Payer: Self-pay

## 2018-07-28 ENCOUNTER — Encounter: Payer: Self-pay | Admitting: Cardiology

## 2018-07-28 VITALS — BP 125/72 | HR 70 | Ht 73.0 in | Wt 223.0 lb

## 2018-07-28 DIAGNOSIS — Z951 Presence of aortocoronary bypass graft: Secondary | ICD-10-CM

## 2018-07-28 DIAGNOSIS — I25119 Atherosclerotic heart disease of native coronary artery with unspecified angina pectoris: Secondary | ICD-10-CM

## 2018-07-28 DIAGNOSIS — I480 Paroxysmal atrial fibrillation: Secondary | ICD-10-CM | POA: Diagnosis not present

## 2018-07-28 MED ORDER — AMIODARONE HCL 200 MG PO TABS: 100.0000 mg | ORAL_TABLET | Freq: Every day | ORAL | 1 refills | Status: DC

## 2018-07-28 NOTE — Progress Notes (Signed)
Subjective:  Primary Physician:  Ardith Dark, MD  Patient ID: Patrick Duran, male    DOB: 1945-07-07, 73 y.o.   MRN: [id number]  Chief Complaint  Patient presents with  . Coronary Artery Disease  . Follow-up    4wk    HPI: Patrick Duran  is a 73 y.o. male  with CAD, leg edema, DM and PAF. He presented on 11/06/2014 with acute coronary syndrome and underwent angioplasty for instent restenosis of the mid to distal RCA placed on 04/15/2014 and had sustained atrial fibrillation with RVR at the same time. He has history of hypertension, Benign positional vertigo, chronic leg edema and venous claudication, hyperlipidemia and diabetes mellitus with stage 3 CKD.  He presented to the hospital on 06/04/2018 with non-ST elevation myocardial infarction, due to recurrent restenosis in the right coronary artery, he underwent one-vessel CABG on 06/09/2018 with free right radial graft to RCA.  Also underwent modified maze procedure followed by left atrial appendage clipping.  He is presently doing well,  no dizziness or chest pain. No palpitations.    Past Medical History:  Diagnosis Date  . Arthritis    "fingers, right hip" (11/07/2014)  . Complication of anesthesia    "trembling during my 1st cath"  . Coronary artery disease   . Easy bruising   . GERD (gastroesophageal reflux disease)   . Headache    "monthly" (11/07/2014)  . 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 History of stomach ulcers   . Hyperlipidemia   . Hypothyroidism   . Myocardial infarction (HCC) 03/2014  . PONV (postoperative nausea and vomiting)   . Stroke [facility])    questionably noted on a MRI  . Trouble swallowing   . Type II diabetes mellitus (HCC)   . Vertigo     Past Surgical History:  Procedure Laterality Date  . ANTERIOR CERVICAL DECOMP/DISCECTOMY FUSION  2004  . CARDIAC CATHETERIZATION  2000  . CARDIAC CATHETERIZATION  04/15/2014   Procedure: CORONARY STENT INTERVENTION;  Surgeon: Pamella Pert, MD;  Location: [facility] CATH LAB;  Service: Cardiovascular;;  RCA  . CARDIAC CATHETERIZATION N/A 11/07/2014   Procedure: Left Heart Cath and Coronary Angiography;  Surgeon: Yates Decamp, MD;  Location: [facility] INVASIVE CV LAB;  Service: Cardiovascular;  Laterality: N/A;  . CLIPPING OF ATRIAL APPENDAGE N/A 06/09/2018   Procedure: CLIPPING OF ATRIAL APPENDAGE using AtriCure Clip size 35;  Surgeon: Loreli Slot, MD;  Location: [facility] OR;  Service: Open Heart Surgery;  Laterality: N/A;  . CORONARY ANGIOGRAPHY N/A 06/05/2018   Procedure: CORONARY ANGIOGRAPHY;  Surgeon: Elder Negus, MD;  Location: [facility] INVASIVE CV LAB;  Service: Cardiovascular;  Laterality: N/A;  . CORONARY ANGIOPLASTY WITH STENT PLACEMENT  2000; 11/07/2014   "3 stents; 1 stent"  . CORONARY ARTERY BYPASS GRAFT N/A 06/09/2018   Procedure: CORONARY ARTERY BYPASS GRAFTING (CABG) x one using radial artery;  Surgeon: Loreli Slot, MD;  Location: [facility] OR;  Service: Open Heart Surgery;  Laterality: N/A;  . LAPAROSCOPIC CHOLECYSTECTOMY  1998  . LEFT HEART CATHETERIZATION WITH CORONARY ANGIOGRAM N/A 04/15/2014   Procedure: LEFT HEART CATHETERIZATION WITH CORONARY ANGIOGRAM;  Surgeon: Pamella Pert, MD;  Location: [facility] CATH LAB;  Service: Cardiovascular;  Laterality: N/A;  . RADIAL ARTERY HARVEST Left 06/09/2018   Procedure: RADIAL ARTERY HARVEST;  Surgeon: Loreli Slot, MD;  Location[facility] OR;  Service: Open  Heart Surgery;  Laterality: Left;  . TEE WITHOUT CARDIOVERSION N/A 06/09/2018   Procedure: TRANSESOPHAGEAL ECHOCARDIOGRAM (TEE);  Surgeon: Loreli SlotHendrickson, Steven C, MD;  [facility];  Service: Open Heart Surgery;  Laterality: N/A;    Social History   Socioeconomic History  . Marital status: Married    Spouse name: Not on file  . Number of children: 3  . Years of education: Not on file  . Highest education level: Not on file  Occupational History  . Not on  file  Social Needs  . Financial resource strain: Not on file  . Food insecurity:    Worry: Not on file    Inability: Not on file  . Transportation needs:    Medical: Not on file    Non-medical: Not on file  Tobacco Use  . Smoking status: Former Smoker    Packs/day: 1.50    Years: 34.00    Pack years: 51.00    Types: Cigarettes    Last attempt to quit: 07/27/2000    Years since quitting: 18.0  . Smokeless tobacco: Never Used  . Tobacco comment: "stopped smoking in ~ 2000"  Substance and Sexual Activity  . Alcohol use: No  . Drug use: No    Comment: 11/07/2014 "experimented in the 1960's and 1970's; nothing since"  . Sexual activity: Not Currently  Lifestyle  . Physical activity:    Days per week: Not on file    Minutes per session: Not on file  . Stress: Not on file  Relationships  . Social connections:    Talks on phone: Not on file    Gets together: Not on file    Attends religious service: Not on file    Active member of club or organization: Not on file    Attends meetings of clubs or organizations: Not on file    Relationship status: Not on file  . Intimate partner violence:    Fear of current or ex partner: Not on file    Emotionally abused: Not on file    Physically abused: Not on file    Forced sexual activity: Not on file  Other Topics Concern  . Not on file  Social History Narrative  . Not on file    Current Outpatient Medications on File Prior to Visit  Medication Sig Dispense Refill  . acetaminophen (TYLENOL) 500 MG tablet Take 500 mg by mouth every 6 (six) hours as needed. For pain    . Alpha-Lipoic Acid 300 MG CAPS Take by mouth daily.      Marland Kitchen. apixaban (ELIQUIS) 5 MG TABS tablet Take 1 tablet (5 mg total) by mouth 2 (two) times daily. 60 tablet 3  . aspirin EC 81 MG EC tablet Take 1 tablet (81 mg total) by mouth daily. 30 tablet 0  . atorvastatin (LIPITOR) 40 MG tablet Take 1 tablet (40 mg total) by mouth daily. 90 tablet 3  . co-enzyme Q-10 30 MG  capsule Take 120 mg by mouth daily.      . diazepam (VALIUM) 5 MG tablet Take 1 tablet (5 mg total) by mouth every 6 (six) hours as needed for anxiety. 30 tablet 0  . dimenhyDRINATE (DRAMAMINE) 50 MG tablet Take 50 mg by mouth every 8 (eight) hours as needed.    . fenofibrate micronized (LOFIBRA) 134 MG capsule Take 1 capsule (134 mg total) by mouth daily before breakfast. 90 capsule 3  . furosemide (LASIX) 40 MG tablet Take 1 tablet (40 mg total) by mouth  daily. 7 tablet 0  . glipiZIDE (GLUCOTROL XL) 10 MG 24 hr tablet Take 1 tablet (10 mg total) by mouth 2 (two) times daily. 180 tablet 3  . Insulin Glargine (LANTUS) 100 UNIT/ML Solostar Pen Inject 20 Units into the skin as directed. 15-35 units q a.m. (Patient taking differently: Inject 15-35 Units into the skin as directed. 15-35 units q a.m.) 15 mL 11  . isosorbide mononitrate (IMDUR) 30 MG 24 hr tablet Take 1 tablet (30 mg total) by mouth daily. 30 tablet 0  . levothyroxine (SYNTHROID, LEVOTHROID) 125 MCG tablet Take 1 tablet (125 mcg total) by mouth daily before breakfast. 90 tablet 3  . meclizine (ANTIVERT) 12.5 MG tablet Take 1 tablet (12.5 mg total) by mouth 3 (three) times daily as needed for dizziness. 30 tablet 3  . metoprolol tartrate (LOPRESSOR) 25 MG tablet Take 1 tablet (25 mg total) by mouth 2 (two) times daily. 60 tablet 3  . Multiple Vitamin (MULTIVITAMIN PO) Take 1 tablet by mouth daily.     . niacin 100 MG tablet Take 100 mg by mouth daily.    . pantoprazole (PROTONIX) 40 MG tablet Take 1 tablet (40 mg total) by mouth daily. 90 tablet 3  . POTASSIUM GLUCONATE PO Take 1 tablet by mouth daily.     . vitamin B-12 (CYANOCOBALAMIN) 100 MCG tablet Take 100 mcg by mouth daily.     . Zinc Acetate, Oral, (ZINC ACETATE PO) Take 1 tablet by mouth daily.     . tamsulosin (FLOMAX) 0.4 MG CAPS capsule Take 1 capsule (0.4 mg total) by mouth daily. (Patient not taking: Reported on 07/28/2018) 30 capsule 3   No current facility-administered  medications on file prior to visit.     Review of Systems  Constitution: Negative for chills, decreased appetite, malaise/fatigue and weight gain.  Eyes: Negative for visual disturbance.  Cardiovascular: Positive for dyspnea on exertion (STABLE) and leg swelling (STABLE). Negative for claudication, palpitations and syncope.  Endocrine: Negative for cold intolerance.  Hematologic/Lymphatic: Does not bruise/bleed easily.  Musculoskeletal: Negative for joint swelling.  Gastrointestinal: Negative for abdominal pain, anorexia and change in bowel habit.  Neurological: Positive for dizziness (has chronic vertigo and stable). Negative for headaches, light-headedness and numbness.  Psychiatric/Behavioral: Negative for depression and substance abuse.  All other systems reviewed and are negative.     Objective:  Blood pressure 125/72, pulse 70, height 6\' 1"  (1.854 m), weight 223 lb (101.2 kg). Body mass index is 29.42 kg/m.  Physical Exam  Constitutional: He is oriented to person, place, and time. He appears well-developed and well-nourished.  Mildly obese  Eyes: Conjunctivae are normal.  Neck: Neck supple.  Pulmonary/Chest: Effort normal.  CABG scan well healed  Musculoskeletal:        General: Edema (MILD) present.  Neurological: He is alert and oriented to person, place, and time.  Psychiatric: He has a normal mood and affect.    Radiology: No results found.  Laboratory Examination:  CMP Latest Ref Rng & Units 06/14/2018 06/13/2018 06/12/2018  Glucose 70 - 99 mg/dL 92 89 92  BUN 8 - 23 mg/dL 02(I) 09(B) 35(H)  Creatinine 0.61 - 1.24 mg/dL 2.99(M) 4.26(S) 3.41(D)  Sodium 135 - 145 mmol/L 138 137 136  Potassium 3.5 - 5.1 mmol/L 3.3(L) 3.6 3.6  Chloride 98 - 111 mmol/L 105 106 108  CO2 22 - 32 mmol/L 27 23 24   Calcium 8.9 - 10.3 mg/dL 6.2(I) 2.9(N) 9.8(X)  Total Protein 6.5 - 8.1 g/dL - - -  Total Bilirubin 0.3 - 1.2 mg/dL - - -  Alkaline Phos 38 - 126 U/L - - -  AST 15 - 41 U/L -  - -  ALT 0 - 44 U/L - - -   CBC Latest Ref Rng & Units 06/12/2018 06/11/2018 06/10/2018  WBC 4.0 - 10.5 K/uL 7.8 7.4 8.0  Hemoglobin 13.0 - 17.0 g/dL 0.4(V) 4.0(J) 8.1(X)  Hematocrit 39.0 - 52.0 % 27.9(L) 28.5(L) 29.3(L)  Platelets 150 - 400 K/uL 165 153 166   Lipid Panel     Component Value Date/Time   CHOL 112 06/06/2018 0515   TRIG 139 06/06/2018 0515   HDL 23 (L) 06/06/2018 0515   CHOLHDL 4.9 06/06/2018 0515   VLDL 28 06/06/2018 0515   LDLCALC 61 06/06/2018 0515   LDLDIRECT 106.6 10/12/2007 0906   HEMOGLOBIN A1C Lab Results  Component Value Date   HGBA1C 5.8 (H) 06/06/2018   MPG 119.76 06/06/2018   TSH Recent Labs    06/05/18 1433  TSH 1.426    CARDIAC STUDIES:   Coronary Angiography 06/05/2018 LM: Normal. LAD: Prox 30%, mid 40% focal stenoses. LCx: Distal 40% focal stenosis. RCA: Local 90% ISR in prox-mid, mid RCA. Distal RCA 70% edge restenosis. Diffuse 80% ISR in distal RCA  Echocardiogram 06/05/2018 : 1. The left ventricle has normal systolic function, with an ejection fraction of 55-60%. Inferior hypokinesis. The cavity size was normal. There is mild concentric left ventricular hypertrophy. Left ventricular diastolic Doppler parameters are consistent with pseudonormalization.  2. The right ventricle has normal systolic function. The cavity was normal. There is no increase in right ventricular wall thickness.  3. Left atrial size was moderately dilated.  4. Right atrial size was mildly dilated.     Assessment:    Coronary artery disease involving native coronary artery of native heart with angina pectoris (HCC)  Hx of CABG 06/09/2018 x 1 Free Radial graft to RCA, modified Maze procedure, clipping LAA with  AtriCure Clip size 35.  Paroxysmal atrial fibrillation (HCC) - Plan: amiodarone (PACERONE) 200 MG tablet   EKG 06/10/2018: Normal sinus rhythm at rate of 72 bpm, left axis deviation, cannot exclude inferior infarct old.  T wave inversion in the inferior  leads cannot exclude ischemia.  Prominent R in V2, cannot exclude true posterior infarct old.  Normal QT interval.  Recommendation:   He is presently doing well, No recurrence of angina pectoris, no dyspnea or palpitations. Blood pressure is well controlled, No change/gaining weight, encouraged him to continue to lose more weight.  He also states that since surgery, he has not had neurogenic claudication.  I reduce the dose of amiodarone from 200 mg  to 100 mg daily.  He is otherwise on appropriate medical therapy including Eliquis for PAF and aspirin 81 mg daily along with isosorbide mononitrate for radial graft for his RCA.  I'll see him back in 4 weeks for follow-up.  He will need EKG on his follow up, weight loss discussed and DM control importance discussed.   Yates Decamp, MD, [facility] 07/28/2018, 10:53 AM [facility] [place] Pager: [phone] Office: [phone] If no answer Cell [phone]

## 2018-08-01 ENCOUNTER — Other Ambulatory Visit: Payer: Self-pay

## 2018-08-01 ENCOUNTER — Encounter: Payer: Self-pay | Admitting: Family Medicine

## 2018-08-01 MED ORDER — GLUCOSE BLOOD VI STRP: ORAL_STRIP | 12 refills | Status: DC

## 2018-08-07 ENCOUNTER — Other Ambulatory Visit: Payer: Self-pay | Admitting: *Deleted

## 2018-08-07 DIAGNOSIS — Z951 Presence of aortocoronary bypass graft: Secondary | ICD-10-CM

## 2018-08-08 ENCOUNTER — Encounter: Payer: Self-pay | Admitting: Thoracic Surgery (Cardiothoracic Vascular Surgery)

## 2018-08-08 ENCOUNTER — Ambulatory Visit
Admission: RE | Admit: 2018-08-08 | Discharge: 2018-08-08 | Disposition: A | Discharge status: Home / Self Care | Payer: HMO | Source: Ambulatory Visit | Attending: Thoracic Surgery (Cardiothoracic Vascular Surgery) | Admitting: Thoracic Surgery (Cardiothoracic Vascular Surgery)

## 2018-08-08 ENCOUNTER — Ambulatory Visit (INDEPENDENT_AMBULATORY_CARE_PROVIDER_SITE_OTHER): Payer: Self-pay | Admitting: Thoracic Surgery (Cardiothoracic Vascular Surgery)

## 2018-08-08 ENCOUNTER — Other Ambulatory Visit: Payer: Self-pay

## 2018-08-08 VITALS — BP 120/68 | HR 68 | Temp 97.5°F | Resp 16 | Ht 73.0 in | Wt 229.2 lb

## 2018-08-08 DIAGNOSIS — Z9889 Other specified postprocedural states: Secondary | ICD-10-CM

## 2018-08-08 DIAGNOSIS — Z951 Presence of aortocoronary bypass graft: Secondary | ICD-10-CM

## 2018-08-08 MED ORDER — FUROSEMIDE 20 MG PO TABS: 20.0000 mg | ORAL_TABLET | Freq: Every day | ORAL | 1 refills | Status: DC

## 2018-08-08 NOTE — Progress Notes (Signed)
[address]       [place] [address]             [phone]     HPI: Patrick Duran returns for a scheduled follow-up visit  Patrick Duran is a 73 year old gentleman with a history of coronary artery disease, hypertension, hyperlipidemia, type 2 diabetes, paroxysmal atrial fibrillation, probable sleep apnea, and vertigo.  He had multiple previous right coronary interventions.  He presented back in March with recurrent substernal chest pain.  Cardiac catheterization revealed severe in-stent restenosis in his right coronary artery.  He was referred for coronary artery bypass grafting.  He underwent coronary bypass grafting with left radial to right coronary artery, pulmonary vein isolation and left atrial appendage clipping on 06/09/2018.  His postoperative course was notable for acute kidney injury with an increase in his creatinine from around 1.1 to 1.88.  It was down to 1.7 at the time of discharge.  He has not had any issues with the urine output since discharge but does notice a small amount of swelling in his ankles.  He had some pain with his left arm incision initially but that is nearly resolved.  He is not having any pain associated with the sternal incision.  He has not had any recurrent angina or shortness of breath.  Past Medical History:  Diagnosis Date  . Arthritis    "fingers, right hip" (11/07/2014)  . Complication of anesthesia    "trembling during my 1st cath"  . Coronary artery disease   . Easy bruising   . GERD (gastroesophageal reflux disease)   . Headache    "monthly" (11/07/2014)  . 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 History of stomach ulcers   . Hyperlipidemia   . Hypothyroidism   . Myocardial infarction (HCC) 03/2014  . PONV (postoperative nausea and vomiting)   . Stroke [facility])    questionably noted on a MRI  . Trouble swallowing   . Type II diabetes mellitus (HCC)    . Vertigo     Current Outpatient Medications  Medication Sig Dispense Refill  . acetaminophen (TYLENOL) 500 MG tablet Take 500 mg by mouth every 6 (six) hours as needed. For pain    . Alpha-Lipoic Acid 300 MG CAPS Take by mouth daily.      Marland Kitchen amiodarone (PACERONE) 200 MG tablet Take 0.5 tablets (100 mg total) by mouth daily. 60 tablet 1  . apixaban (ELIQUIS) 5 MG TABS tablet Take 1 tablet (5 mg total) by mouth 2 (two) times daily. 60 tablet 3  . aspirin EC 81 MG EC tablet Take 1 tablet (81 mg total) by mouth daily. 30 tablet 0  . atorvastatin (LIPITOR) 40 MG tablet Take 1 tablet (40 mg total) by mouth daily. 90 tablet 3  . co-enzyme Q-10 30 MG capsule Take 120 mg by mouth daily.      . diazepam (VALIUM) 5 MG tablet Take 1 tablet (5 mg total) by mouth every 6 (six) hours as needed for anxiety. 30 tablet 0  . dimenhyDRINATE (DRAMAMINE) 50 MG tablet Take 50 mg by mouth every 8 (eight) hours as needed.    . fenofibrate micronized (LOFIBRA) 134 MG capsule Take 1 capsule (134 mg total) by mouth daily before breakfast. 90 capsule 3  . glipiZIDE (GLUCOTROL XL) 10 MG 24 hr tablet Take 1 tablet (10 mg total) by mouth  2 (two) times daily. 180 tablet 3  . glucose blood (ONE TOUCH ULTRA TEST) test strip Check blood sugar 2-3 times daily 100 each 12  . Insulin Glargine (LANTUS) 100 UNIT/ML Solostar Pen Inject 20 Units into the skin as directed. 15-35 units q a.m. (Patient taking differently: Inject 15-35 Units into the skin as directed. 15-35 units q a.m.) 15 mL 11  . levothyroxine (SYNTHROID, LEVOTHROID) 125 MCG tablet Take 1 tablet (125 mcg total) by mouth daily before breakfast. 90 tablet 3  . meclizine (ANTIVERT) 12.5 MG tablet Take 1 tablet (12.5 mg total) by mouth 3 (three) times daily as needed for dizziness. 30 tablet 3  . metoprolol tartrate (LOPRESSOR) 25 MG tablet Take 1 tablet (25 mg total) by mouth 2 (two) times daily. 60 tablet 3  . Multiple Vitamin (MULTIVITAMIN PO) Take 1 tablet by mouth  daily.     . niacin 100 MG tablet Take 100 mg by mouth daily.    . pantoprazole (PROTONIX) 40 MG tablet Take 1 tablet (40 mg total) by mouth daily. 90 tablet 3  . POTASSIUM GLUCONATE PO Take 1 tablet by mouth daily.     . vitamin B-12 (CYANOCOBALAMIN) 100 MCG tablet Take 100 mcg by mouth daily.     . Zinc Acetate, Oral, (ZINC ACETATE PO) Take 1 tablet by mouth daily.     . furosemide (LASIX) 20 MG tablet Take 1 tablet (20 mg total) by mouth daily. 30 tablet 1   No current facility-administered medications for this visit.     Physical Exam BP 120/68 (BP Location: Right Arm, Patient Position: Sitting, Cuff Size: Large)   Pulse 68   Temp (!) 97.5 F (36.4 C) (Skin)   Resp 16   Ht 6\' 1"  (1.854 m)   Wt 229 lb 3.2 oz (104 kg)   SpO2 98% Comment: ON RA  BMI 30.65 kg/m  73 year old man in no acute distress Alert and oriented x3 with no focal deficits Lungs clear with equal breath sounds bilaterally Cardiac regular rate and rhythm normal S1 and S2 Sternum stable, incision well-healed Left arm incision well-healed Trace edema both ankles  Diagnostic Tests: CHEST - 2 VIEW  COMPARISON:  06/12/2018  FINDINGS: Postsurgical changes compatible with a CABG and atrial appendage clipping. Lungs are clear without pulmonary edema or pleural effusions. Negative for a pneumothorax. Trachea is midline. Surgical plate in the lower cervical spine. Heart size is within normal limits.  IMPRESSION: Postoperative changes.  No acute cardiopulmonary disease.   Electronically Signed   By: Richarda Overlie M.D.   On: 08/08/2018 12:44 I personally reviewed the chest X ray images and concur with the findings noted above  Rhythm strip shows normal sinus rhythm rate of 75  Impression: Patrick Duran is a 73 year old gentleman with multiple cardiac risk factors and known coronary disease.  He was presented with recurrent angina in early March.  He was found to have severe in-stent restenosis in a  heavily diseased right coronary.  He also has history of paroxysmal atrial fibrillation.  He underwent CABG x1 with a left radial artery to right coronary graft, along with pulmonary vein isolation and a left atrial appendage clip on 06/09/2018.  His postoperative course was notable for acute kidney injury with a increase in his creatinine from 1.1 to a maximum of 1.88.  It was down to 1.7 prior to discharge.  He went home on day 5.  He feels well.  He is having minimal discomfort.  He has not  had any recurrent angina.  He is not aware of any atrial fibrillation or palpitations.  He has started driving.  He has not had any problems with that.  Paroxysmal atrial fibrillation - Dr. Jacinto Halim has decreased his amiodarone 200 mg a day and will probably stop that in about a month.  He is still on Eliquis.  There are no restrictions on his activities at this time.  Diabetes-type II, metformin stopped in hospital due to acute kidney injury.  His sugars have been running a little high.  He asked about restarting that.  I will defer that decision to his primary care.  Plan: Follow-up with primary care and Dr. Jacinto Halim  I will be happy to see Patrick Duran back at anytime if I can be of any further assistance with his care.   Loreli Slot, MD [facility] [phone]

## 2018-09-05 ENCOUNTER — Ambulatory Visit (INDEPENDENT_AMBULATORY_CARE_PROVIDER_SITE_OTHER): Payer: HMO | Admitting: Cardiology

## 2018-09-05 ENCOUNTER — Other Ambulatory Visit: Payer: Self-pay

## 2018-09-05 ENCOUNTER — Encounter: Payer: Self-pay | Admitting: Cardiology

## 2018-09-05 VITALS — BP 136/80 | HR 68 | Temp 98.0°F | Ht 73.0 in | Wt 230.6 lb

## 2018-09-05 DIAGNOSIS — I25119 Atherosclerotic heart disease of native coronary artery with unspecified angina pectoris: Secondary | ICD-10-CM

## 2018-09-05 DIAGNOSIS — E782 Mixed hyperlipidemia: Secondary | ICD-10-CM | POA: Diagnosis not present

## 2018-09-05 DIAGNOSIS — E1121 Type 2 diabetes mellitus with diabetic nephropathy: Secondary | ICD-10-CM

## 2018-09-05 DIAGNOSIS — Z951 Presence of aortocoronary bypass graft: Secondary | ICD-10-CM | POA: Diagnosis not present

## 2018-09-05 DIAGNOSIS — I480 Paroxysmal atrial fibrillation: Secondary | ICD-10-CM

## 2018-09-05 MED ORDER — METFORMIN HCL 500 MG PO TABS: 500.0000 mg | ORAL_TABLET | Freq: Two times a day (BID) | ORAL | 0 refills | Status: DC

## 2018-09-05 MED ORDER — ATORVASTATIN CALCIUM 40 MG PO TABS: 40.0000 mg | ORAL_TABLET | Freq: Every day | ORAL | 3 refills | Status: DC

## 2018-09-05 NOTE — Progress Notes (Signed)
Subjective:  Primary Physician:  Vivi Barrack, MD  Patient ID: Patrick Duran, male    DOB: 1946-02-27, 73 y.o.   MRN: [id number]  Chief Complaint  Patient presents with  . PAF  . Coronary Artery Disease    4wk     HPI: Patrick Duran  is a 73 y.o. male  with CAD, leg edema, DM and PAF. He presented on 11/06/2014 with acute coronary syndrome and underwent angioplasty for instent restenosis of the mid to distal RCA placed on 04/15/2014 and had sustained atrial fibrillation with RVR at the same time. He has history of hypertension, Benign positional vertigo, chronic leg edema and venous claudication, hyperlipidemia and diabetes mellitus with stage 3 CKD.  He presented to the hospital on 06/04/2018 with non-ST elevation myocardial infarction, due to recurrent restenosis in the right coronary artery, he underwent one-vessel CABG on 06/09/2018 with free right radial graft to RCA.  Also underwent modified maze procedure followed by left atrial appendage clipping.  He is presently doing well,  no dizziness or chest pain. Has chronic mild dyspnea and leg edema for which he takes lasix.  Has occasional brief palpitations.   Past Medical History:  Diagnosis Date  . Arthritis    "fingers, right hip" (11/07/2014)  . Complication of anesthesia    "trembling during my 1st cath"  . Coronary artery disease   . Easy bruising   . GERD (gastroesophageal reflux disease)   . Headache    "monthly" (11/07/2014)  . 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 History of stomach ulcers   . Hyperlipidemia   . Hypothyroidism   . Myocardial infarction ([place]) 03/2014  . PONV (postoperative nausea and vomiting)   . Stroke [facility])    questionably noted on a MRI  . Trouble swallowing   . Type II diabetes mellitus ([place])   . Vertigo     Past Surgical History:  Procedure Laterality Date  . ANTERIOR CERVICAL DECOMP/DISCECTOMY FUSION  2004   . CARDIAC CATHETERIZATION  2000  . CARDIAC CATHETERIZATION  04/15/2014   Procedure: CORONARY STENT INTERVENTION;  Surgeon: Laverda Page, MD;  Location: [facility] CATH LAB;  Service: Cardiovascular;;  RCA  . CARDIAC CATHETERIZATION N/A 11/07/2014   Procedure: Left Heart Cath and Coronary Angiography;  Surgeon: Adrian Prows, MD;  Location: [place] CV LAB;  Service: Cardiovascular;  Laterality: N/A;  . CLIPPING OF ATRIAL APPENDAGE N/A 06/09/2018   Procedure: CLIPPING OF ATRIAL APPENDAGE using AtriCure Clip size 35;  Surgeon: Melrose Nakayama, MD;  Location: [place];  Service: Open Heart Surgery;  Laterality: N/A;  . CORONARY ANGIOGRAPHY N/A 06/05/2018   Procedure: CORONARY ANGIOGRAPHY;  Surgeon: Nigel Mormon, MD;  Location: [place] CV LAB;  Service: Cardiovascular;  Laterality: N/A;  . CORONARY ANGIOPLASTY WITH STENT PLACEMENT  2000; 11/07/2014   "3 stents; 1 stent"  . CORONARY ARTERY BYPASS GRAFT N/A 06/09/2018   Procedure: CORONARY ARTERY BYPASS GRAFTING (CABG) x one using radial artery;  Surgeon: Melrose Nakayama, MD;  Location: [place];  Service: Open Heart Surgery;  Laterality: N/A;  . [place]  . LEFT HEART CATHETERIZATION WITH CORONARY ANGIOGRAM N/A 04/15/2014   Procedure: LEFT HEART CATHETERIZATION WITH CORONARY ANGIOGRAM;  Surgeon: Laverda Page, MD;  Location: [facility] CATH LAB;  Service: Cardiovascular;  Laterality: N/A;  . RADIAL ARTERY HARVEST Left 06/09/2018   Procedure:  RADIAL ARTERY HARVEST;  Surgeon: Melrose Nakayama, MD;  Location: [place];  Service: Open Heart Surgery;  Laterality: Left;  . TEE WITHOUT CARDIOVERSION N/A 06/09/2018   Procedure: TRANSESOPHAGEAL ECHOCARDIOGRAM (TEE);  Surgeon: Melrose Nakayama, MD;  Location: [place];  Service: Open Heart Surgery;  Laterality: N/A;    Social History   Socioeconomic History  . Marital status: Married    Spouse name: Not on file  . Number of children: 3  . Years of education: Not on file   . Highest education level: Not on file  Occupational History  . Not on file  Social Needs  . Financial resource strain: Not on file  . Food insecurity:    Worry: Not on file    Inability: Not on file  . Transportation needs:    Medical: Not on file    Non-medical: Not on file  Tobacco Use  . Smoking status: Former Smoker    Packs/day: 1.50    Years: 34.00    Pack years: 51.00    Types: Cigarettes    Last attempt to quit: 07/27/2000    Years since quitting: 18.1  . Smokeless tobacco: Never Used  . Tobacco comment: "stopped smoking in ~ 2000"  Substance and Sexual Activity  . Alcohol use: No  . Drug use: No    Comment: 11/07/2014 "experimented in the 1960's and 1970's; nothing since"  . Sexual activity: Not Currently  Lifestyle  . Physical activity:    Days per week: Not on file    Minutes per session: Not on file  . Stress: Not on file  Relationships  . Social connections:    Talks on phone: Not on file    Gets together: Not on file    Attends religious service: Not on file    Active member of club or organization: Not on file    Attends meetings of clubs or organizations: Not on file    Relationship status: Not on file  . Intimate partner violence:    Fear of current or ex partner: Not on file    Emotionally abused: Not on file    Physically abused: Not on file    Forced sexual activity: Not on file  Other Topics Concern  . Not on file  Social History Narrative  . Not on file    Current Outpatient Medications on File Prior to Visit  Medication Sig Dispense Refill  . acetaminophen (TYLENOL) 500 MG tablet Take 500 mg by mouth every 6 (six) hours as needed. For pain    . Alpha-Lipoic Acid 300 MG CAPS Take by mouth daily.      Marland Kitchen amiodarone (PACERONE) 200 MG tablet Take 0.5 tablets (100 mg total) by mouth daily. (Patient taking differently: Take 200 mg by mouth daily. ) 60 tablet 1  . apixaban (ELIQUIS) 5 MG TABS tablet Take 1 tablet (5 mg total) by mouth 2 (two)  times daily. 60 tablet 3  . aspirin EC 81 MG EC tablet Take 1 tablet (81 mg total) by mouth daily. 30 tablet 0  . co-enzyme Q-10 30 MG capsule Take 120 mg by mouth daily.      . diazepam (VALIUM) 5 MG tablet Take 1 tablet (5 mg total) by mouth every 6 (six) hours as needed for anxiety. 30 tablet 0  . dimenhyDRINATE (DRAMAMINE) 50 MG tablet Take 50 mg by mouth every 8 (eight) hours as needed.    . fenofibrate micronized (LOFIBRA) 134 MG capsule Take 1 capsule (  134 mg total) by mouth daily before breakfast. 90 capsule 3  . furosemide (LASIX) 20 MG tablet Take 1 tablet (20 mg total) by mouth daily. 30 tablet 1  . glipiZIDE (GLUCOTROL XL) 10 MG 24 hr tablet Take 1 tablet (10 mg total) by mouth 2 (two) times daily. 180 tablet 3  . Insulin Glargine (LANTUS) 100 UNIT/ML Solostar Pen Inject 20 Units into the skin as directed. 15-35 units q a.m. (Patient taking differently: Inject 15-35 Units into the skin as directed. 15-35 units q a.m.) 15 mL 11  . levothyroxine (SYNTHROID, LEVOTHROID) 125 MCG tablet Take 1 tablet (125 mcg total) by mouth daily before breakfast. 90 tablet 3  . meclizine (ANTIVERT) 12.5 MG tablet Take 1 tablet (12.5 mg total) by mouth 3 (three) times daily as needed for dizziness. 30 tablet 3  . metoprolol tartrate (LOPRESSOR) 25 MG tablet Take 1 tablet (25 mg total) by mouth 2 (two) times daily. 60 tablet 3  . Multiple Vitamin (MULTIVITAMIN PO) Take 1 tablet by mouth daily.     . niacin 100 MG tablet Take 100 mg by mouth daily.    . pantoprazole (PROTONIX) 40 MG tablet Take 1 tablet (40 mg total) by mouth daily. 90 tablet 3  . POTASSIUM GLUCONATE PO Take 1 tablet by mouth daily.     . vitamin B-12 (CYANOCOBALAMIN) 100 MCG tablet Take 100 mcg by mouth daily.     . Zinc Acetate, Oral, (ZINC ACETATE PO) Take 1 tablet by mouth daily.     Marland Kitchen glucose blood (ONE TOUCH ULTRA TEST) test strip Check blood sugar 2-3 times daily 100 each 12   No current facility-administered medications on file  prior to visit.     Review of Systems  Constitution: Negative for chills, decreased appetite, malaise/fatigue and weight gain.  Eyes: Negative for visual disturbance.  Cardiovascular: Positive for dyspnea on exertion (STABLE) and leg swelling (STABLE). Negative for claudication, palpitations and syncope.  Endocrine: Negative for cold intolerance.  Hematologic/Lymphatic: Does not bruise/bleed easily.  Musculoskeletal: Negative for joint swelling.  Gastrointestinal: Negative for abdominal pain, anorexia and change in bowel habit.  Neurological: Positive for dizziness (has chronic vertigo and stable). Negative for headaches, light-headedness and numbness.  Psychiatric/Behavioral: Negative for depression and substance abuse.  All other systems reviewed and are negative.     Objective:  Blood pressure 136/80, pulse 68, temperature 98 F (36.7 C), height _0  (1.854 m), weight 230 lb 9.6 oz (104.6 kg), SpO2 98 %. Body mass index is 30.42 kg/m.  Physical Exam  Constitutional: He appears well-developed and well-nourished. No distress.  HENT:  Head: Atraumatic.  Eyes: Conjunctivae are normal.  Neck: Neck supple. No JVD present. No thyromegaly present.  Cardiovascular: Normal rate, regular rhythm, normal heart sounds, intact distal pulses and normal pulses. Exam reveals no gallop.  No murmur heard. 1 plus bilateral pitting below knee edema. Chronic venostasis changes left leg noted. Normal carotid pulse and peripheral pulse.  Pulmonary/Chest: Effort normal and breath sounds normal.  Sternotomy scar noted  Abdominal: Soft. Bowel sounds are normal.  Musculoskeletal: Normal range of motion.  Neurological: He is alert.  Skin: Skin is warm and dry.  Psychiatric: He has a normal mood and affect.    Radiology: No results found.  Laboratory Examination:  CMP Latest Ref Rng & Units 06/14/2018 06/13/2018 06/12/2018  Glucose 70 - 99 mg/dL 92 89 92  BUN 8 - 23 mg/dL 27(H) 29(H) 26(H)   Creatinine 0.61 - 1.24 mg/dL 1.70(H) 1.88(H)  1.86(H)  Sodium 135 - 145 mmol/L 138 137 136  Potassium 3.5 - 5.1 mmol/L 3.3(L) 3.6 3.6  Chloride 98 - 111 mmol/L 105 106 108  CO2 22 - 32 mmol/L _0 Calcium 8.9 - 10.3 mg/dL 8.8(L) 8.8(L) 8.5(L)  Total Protein 6.5 - 8.1 g/dL - - -  Total Bilirubin 0.3 - 1.2 mg/dL - - -  Alkaline Phos 38 - 126 U/L - - -  AST 15 - 41 U/L - - -  ALT 0 - 44 U/L - - -   CBC Latest Ref Rng & Units 06/12/2018 06/11/2018 06/10/2018  WBC 4.0 - 10.5 K/uL 7.8 7.4 8.0  Hemoglobin 13.0 - 17.0 g/dL 9.1(L) 9.1(L) 9.3(L)  Hematocrit 39.0 - 52.0 % 27.9(L) 28.5(L) 29.3(L)  Platelets 150 - 400 K/uL 165 153 166   Lipid Panel     Component Value Date/Time   CHOL 112 06/06/2018 0515   TRIG 139 06/06/2018 0515   HDL 23 (L) 06/06/2018 0515   CHOLHDL 4.9 06/06/2018 0515   VLDL 28 06/06/2018 0515   LDLCALC 61 06/06/2018 0515   LDLDIRECT 106.6 10/12/2007 0906   HEMOGLOBIN A1C Lab Results  Component Value Date   HGBA1C 5.8 (H) 06/06/2018   MPG 119.76 06/06/2018   TSH Recent Labs    06/05/18 1433  TSH 1.426    CARDIAC STUDIES:   Coronary Angiography 06/05/2018 LM: Normal. LAD: Prox 30%, mid 40% focal stenoses. LCx: Distal 40% focal stenosis. RCA: Local 90% ISR in prox-mid, mid RCA. Distal RCA 70% edge restenosis. Diffuse 80% ISR in distal RCA  Echocardiogram 06/05/2018 : 1. The left ventricle has normal systolic function, with an ejection fraction of 55-60%. Inferior hypokinesis. The cavity size was normal. There is mild concentric left ventricular hypertrophy. Left ventricular diastolic Doppler parameters are consistent with pseudonormalization.  2. The right ventricle has normal systolic function. The cavity was normal. There is no increase in right ventricular wall thickness.  3. Left atrial size was moderately dilated.  4. Right atrial size was mildly dilated.     Assessment:    Paroxysmal atrial fibrillation (HCC) - CHA2DS2-VASc Score is 4. Yearly  risk of stroke:  4 - Plan: EKG 12-Lead, CBC, TSH  Coronary artery disease involving native coronary artery of native heart with angina pectoris (HCC)  Hx of CABG 06/09/2018 x 1 Free Radial graft to RCA, modified Maze procedure, clipping LAA with  AtriCure Clip size 35.  Mixed hyperlipidemia - Plan: atorvastatin (LIPITOR) 40 MG tablet, Lipid Panel With LDL/HDL Ratio  Controlled type 2 diabetes mellitus with diabetic nephropathy, without long-term current use of insulin (HCC) - Plan: atorvastatin (LIPITOR) 40 MG tablet, CMP14+EGFR, Hgb A1c w/o eAG  EKG 09/05/2018: Sinus rhythm with first-degree AV block at the rate of 67 bpm, left axis deviation, left anterior fascicular block.  Poor progression, cannot exclude anteroseptal infarct old.  Nonspecific T-wave flattening. Compared to EKG 06/10/2018,  T wave inversion in the inferior leads not seen.  Recommendation:   He is presently doing well, No recurrence of angina pectoris, no dyspnea, has brief  palpitations. Blood pressure is well controlled, No change/gaining weight, encouraged him to continue to lose more weight.    Continue amiodarone 100 mg daily For paroxysmal atrial fibrillation and I'll consider discontinuing this in 6 months if he maintains sinus rhythm.  He is otherwise on appropriate medical therapy including Eliquis for PAF and aspirin 81 mg daily along with isosorbide mononitrate for radial graft for his RCA.  I'll see him back in 6 months.  He is out of atorvastatin and also metformin which have refilled.  He needs labs including CBC, TSH, A1c, CMP which she will do in 3 weeks.  He is now established with Lewie Loron, M.D. is his PCP.  Adrian Prows, MD, [facility] 09/05/2018, 10:54 AM [facility]. [place] Pager: [phone] Office: [phone] If no answer Cell [phone]

## 2018-09-07 ENCOUNTER — Telehealth (HOSPITAL_COMMUNITY): Payer: Self-pay | Admitting: *Deleted

## 2018-09-07 NOTE — Telephone Encounter (Signed)
Pt called to inquire about interest in participating in virtual cardiac rehab.  No answer. VM left for patient to return call to department.

## 2018-09-08 ENCOUNTER — Ambulatory Visit: Payer: Self-pay

## 2018-09-08 ENCOUNTER — Ambulatory Visit (INDEPENDENT_AMBULATORY_CARE_PROVIDER_SITE_OTHER): Payer: HMO | Admitting: Family Medicine

## 2018-09-08 ENCOUNTER — Encounter: Payer: Self-pay | Admitting: Family Medicine

## 2018-09-08 DIAGNOSIS — R21 Rash and other nonspecific skin eruption: Secondary | ICD-10-CM | POA: Diagnosis not present

## 2018-09-08 MED ORDER — TRIAMCINOLONE ACETONIDE 0.1 % EX CREA: 1.0000 "application " | TOPICAL_CREAM | Freq: Two times a day (BID) | CUTANEOUS | 0 refills | Status: AC

## 2018-09-08 MED ORDER — KETOCONAZOLE 2 % EX CREA: 1.0000 "application " | TOPICAL_CREAM | Freq: Two times a day (BID) | CUTANEOUS | 0 refills | Status: AC

## 2018-09-08 NOTE — Telephone Encounter (Signed)
Edit:  Virtual visit.

## 2018-09-08 NOTE — Progress Notes (Signed)
    Chief Complaint:  Patrick Duran is a 73 y.o. male who presents today for a virtual office visit with a chief complaint of rash.   Assessment/Plan:  Rash Consistent with fungal infection based on visual exam.  Will start topical ketoconazole.  Also start topical triamcinolone.  No signs of bacterial infection or systemic illness.  Discussed reasons to return to care.  Follow-up as needed.    Subjective:  HPI:  Rash Started about a week ago.  Located on right lower extremity.  Area is very pruritic.  He has noticed a few small red bumps around the area as well.  Has tried topical antifungal cream over-the-counter which has not worked.  No fevers or chills.  No obvious precipitating events.  No obvious exposures.  No other treatments tried.  No other obvious alleviating or aggravating factors.  ROS: Per HPI  PMH: He reports that he quit smoking about 18 years ago. His smoking use included cigarettes. He has a 51.00 pack-year smoking history. He has never used smokeless tobacco. He reports that he does not drink alcohol or use drugs.      Objective/Observations  Physical Exam: Gen: NAD, resting comfortably Pulm: Normal work of breathing Neuro: Grossly normal, moves all extremities Psych: Normal affect and thought content Skin: Well-demarcated erythematous rash approximately 2 cm in diameter with bordering erythema on right lower extremity.  Several discrete, punctate erythematous satellite lesions noted as well.  Virtual Visit via Video   I connected with Patrick Duran on 09/08/18 at 11:00 AM EDT by a video enabled telemedicine application and verified that I am speaking with the correct person using two identifiers. I discussed the limitations of evaluation and management by telemedicine and the availability of in person appointments. The patient expressed understanding and agreed to proceed.   Patient location: Home Provider location: [place]  participating in the virtual visit: Myself and Patient     Algis Greenhouse. Jerline Pain, MD 09/08/2018 11:40 AM

## 2018-09-08 NOTE — Telephone Encounter (Signed)
Patient is being seen in the office today.

## 2018-09-08 NOTE — Telephone Encounter (Signed)
Incoming  Call from Patients wife with a complaint of  Of a rash on his ankles that has blisters that are red and oozing.  States that it itches Patient has used Wound care . It is on the right leg.  Wife states that patient is a diabetic and a heart Patient.   Placed call to  [place] [facility].  Patient and wife would like to have an appointment today.  Transferred to [place]   Answer Assessment - Initial Assessment Questions 1.) CALLER DIAGNOSIS: "What do you think is causing the rash?" (e.g., Chickenpox, Hives, Impetigo, Athlete's Foot, etc.)  2.) LOCATION:  "Is it widespread or localized?"  3.) NEW MEDICATIONS: "Are you taking any new medicine?"Rash  Rash on ankles red blisters right leg towards. Itches.  finger  Ring finger  Protocols used: RASH - GUIDELINE SELECTION-A-AH

## 2018-09-11 ENCOUNTER — Other Ambulatory Visit: Payer: Self-pay

## 2018-09-11 NOTE — Patient Outreach (Signed)
[place] [facility]) Care Management Chronic Special Needs Program  09/11/2018  Name: Patrick Duran DOB: 07-06-1945  MRN: [id number]  Patrick Duran is enrolled in a chronic special needs plan.. Chronic Care Management Coordinator telephoned client to complete health risk assessment and to review individualized care plan.  Introduced the chronic care management program, importance of client participation, and taking their care plan to all provider appointments and inpatient facilities.    Subjective: RNCM received return call. Client reports he is doing better since heart surgery. He reports he has followed up with his primary care and cardiologist. He denies any issues or problems at this time. He states his wife helps him with managing his medication placing them in a pill box. He states he has been in the "donut hole" in the past and expects to fall into the coverage gap soon due to Elliquis and Lantus. Client denies any questions regarding medications and declines pharmacy referral for medication review, but Client is agreeable to pharmacy referral to determine eligibility for any available assistance regarding "donut hole".   Goals Addressed            This Visit's Progress   . COMPLETED: Client understands the importance of follow-up with providers by attending scheduled visits   On track    Verbalized understanding of importance of follow up provider visits.    . COMPLETED: Client verbalizes knowledge of Heart Attack self management skills within the next 3-6 months.    On track    Verbalized self management skills: taking medications as prescribed, signs/symptoms associated;attending follow up visits; monitoring diet/sodium intake; remains active.    . COMPLETED: Client will report no worsening of symptoms of Atrial Fibrillation within the next 3-6 months.   On track    Denies any symptoms or worsening of symptoms.    . COMPLETED: Client will report no worsening of  symptoms related to heart disease within the next 3-6 months.   On track    Verbalized continued improvement. Continues to follow up with providers.    . COMPLETED: Client will use Assistive Devices as needed and verbalize understanding of device use   On track    Denies questions or issues with glucometer.    . Client will verbalize knowledge of self management of Hypertension as evidences by BP reading of 140/90 or less; or as defined by provider   On track   . HEMOGLOBIN A1C < 7.0       Diabetes self management actions: Glucose monitoring per provider recommendations Eat Healthy Check feet daily Visit provider every 3-6 months as directed Hbg A1C level every 3-6 months. Eye Exam yearly    . Maintain timely refills of diabetic medication as prescribed within the year .   On track   . Obtain annual  Lipid Profile, LDL-C   On track   . Obtain Annual Eye (retinal)  Exam    On track   . Obtain Annual Foot Exam   On track   . Obtain annual screen for micro albuminuria (urine) , nephropathy (kidney problems)   On track   . Obtain Hemoglobin A1C at least 2 times per year   On track   . Visit Primary Care Provider or Endocrinologist at least 2 times per year    On track     Covid 19 precautions discussed. RNCM reinforced the 24 hour nurse advice line. RNCM encouraged client to contact health care concierge for benefit questions. Client encouraged to call RNCM  as needed/Confirmed he has contact number.  Plan:  Send successful outreach letter with a copy of their individualized care plan and Send individual care plan to provider Chronic care management coordination will outreach in: 6 months. Will refer client to: pharmacy.   Kathyrn Sheriff, RN, MSN, [facility] Chronic Care Management Coordinator [facility] [phone]

## 2018-09-14 ENCOUNTER — Telehealth: Payer: Self-pay | Admitting: Pharmacist

## 2018-09-14 NOTE — Patient Outreach (Signed)
[facility]) Care Management  09/14/2018  KEARNEY EVITT 07-28-1945 [id number]   Patient was called regarding CSNP medication review. Unfortunately, he did not answer the phone.  HIPAA compliant message was left on his voicemail.  Plan: Call patient back in 5-7 business days. Send patient an unsuccessful contact letter.   Elayne Guerin, PharmD, [place] Clinical Pharmacist [phone]

## 2018-09-18 ENCOUNTER — Other Ambulatory Visit: Payer: Self-pay | Admitting: Cardiology

## 2018-09-18 NOTE — Telephone Encounter (Signed)
Please fill

## 2018-09-20 ENCOUNTER — Other Ambulatory Visit: Payer: Self-pay | Admitting: Cardiology

## 2018-09-20 NOTE — Telephone Encounter (Signed)
Please fill

## 2018-09-22 ENCOUNTER — Telehealth (HOSPITAL_COMMUNITY): Payer: Self-pay

## 2018-09-22 NOTE — Telephone Encounter (Signed)
No response from pt, closed referral. °

## 2018-09-25 ENCOUNTER — Ambulatory Visit: Payer: Self-pay | Admitting: Pharmacist

## 2018-09-25 ENCOUNTER — Other Ambulatory Visit: Payer: Self-pay | Admitting: Pharmacist

## 2018-09-25 NOTE — Patient Outreach (Signed)
[place] [facility]) Care Management  09/25/2018  Patrick Duran [birth year] [id number]  Patient was called regarding medication assistance and review as he is part of [facility] CSNP. Unfortunately, he did not answer either number listed in his chart.  HIPAA compliant message left on both voicemails. Unsuccessful letter mailed to patient 09/14/2018.  Today is the second unsuccessful phone call.  Plan: Call patient back in 1 month per nature of referral,

## 2018-10-08 ENCOUNTER — Other Ambulatory Visit: Payer: Self-pay | Admitting: Thoracic Surgery (Cardiothoracic Vascular Surgery)

## 2018-10-13 LAB — LIPID PANEL WITH LDL/HDL RATIO
Cholesterol, Total: 159 mg/dL (ref 100–199)
HDL: 26 mg/dL — ABNORMAL LOW (ref >39)
LDL Calculated: 103 mg/dL — ABNORMAL HIGH (ref 0–99)
LDl/HDL Ratio: 4 ratio — ABNORMAL HIGH (ref 0.0–3.6)
Triglycerides: 148 mg/dL (ref 0–149)
VLDL Cholesterol Cal: 30 mg/dL (ref 5–40)

## 2018-10-13 LAB — CBC
Hematocrit: 37.9 % (ref 37.5–51.0)
Hemoglobin: 13 g/dL (ref 13.0–17.7)
MCH: 30.2 pg (ref 26.6–33.0)
MCHC: 34.3 g/dL (ref 31.5–35.7)
MCV: 88 fL (ref 79–97)
Platelets: 233 10*3/uL (ref 150–450)
RBC: 4.3 x10E6/uL (ref 4.14–5.80)
RDW: 13.7 % (ref 11.6–15.4)
WBC: 4.7 10*3/uL (ref 3.4–10.8)

## 2018-10-13 LAB — CMP14+EGFR
ALT: 19 IU/L (ref 0–44)
AST: 21 IU/L (ref 0–40)
Albumin/Globulin Ratio: 1.8 (ref 1.2–2.2)
Albumin: 4.3 g/dL (ref 3.7–4.7)
Alkaline Phosphatase: 119 IU/L — ABNORMAL HIGH (ref 39–117)
BUN/Creatinine Ratio: 13 (ref 10–24)
BUN: 20 mg/dL (ref 8–27)
Bilirubin Total: 0.5 mg/dL (ref 0.0–1.2)
CO2: 21 mmol/L (ref 20–29)
Calcium: 9.2 mg/dL (ref 8.6–10.2)
Chloride: 101 mmol/L (ref 96–106)
Creatinine, Ser: 1.5 mg/dL — ABNORMAL HIGH (ref 0.76–1.27)
GFR calc Af Amer: 53 mL/min/{1.73_m2} — ABNORMAL LOW (ref >59)
GFR calc non Af Amer: 46 mL/min/{1.73_m2} — ABNORMAL LOW (ref >59)
Globulin, Total: 2.4 g/dL (ref 1.5–4.5)
Glucose: 135 mg/dL — ABNORMAL HIGH (ref 65–99)
Potassium: 4.5 mmol/L (ref 3.5–5.2)
Sodium: 140 mmol/L (ref 134–144)
Total Protein: 6.7 g/dL (ref 6.0–8.5)

## 2018-10-13 LAB — HGB A1C W/O EAG: Hgb A1c MFr Bld: 7 % — ABNORMAL HIGH (ref 4.8–5.6)

## 2018-10-13 LAB — TSH: TSH: 4.61 u[IU]/mL — ABNORMAL HIGH (ref 0.450–4.500)

## 2018-10-16 ENCOUNTER — Other Ambulatory Visit: Payer: Self-pay | Admitting: Cardiology

## 2018-10-16 DIAGNOSIS — E1121 Type 2 diabetes mellitus with diabetic nephropathy: Secondary | ICD-10-CM

## 2018-10-16 DIAGNOSIS — E782 Mixed hyperlipidemia: Secondary | ICD-10-CM

## 2018-10-16 MED ORDER — EZETIMIBE 10 MG PO TABS: 10.0000 mg | ORAL_TABLET | Freq: Every day | ORAL | 3 refills | Status: DC

## 2018-10-30 ENCOUNTER — Other Ambulatory Visit: Payer: Self-pay | Admitting: Pharmacist

## 2018-10-30 NOTE — Patient Outreach (Signed)
[place] [facility]) Care Management  10/30/2018  Patrick Duran [birth year] [id number]   Patient was called to complete a CSNP medication review. Today's call was the 3rd unsuccessful call. Patient was sent an unsuccessful outreach letter on 09/14/2018.  Plan: Call patient back in 6-8 weeks.   Elayne Guerin, PharmD, [place] Clinical Pharmacist [phone]

## 2018-11-02 ENCOUNTER — Ambulatory Visit: Payer: Self-pay | Admitting: Pharmacist

## 2018-12-04 ENCOUNTER — Other Ambulatory Visit: Payer: Self-pay | Admitting: Cardiology

## 2018-12-04 DIAGNOSIS — I480 Paroxysmal atrial fibrillation: Secondary | ICD-10-CM

## 2018-12-18 ENCOUNTER — Other Ambulatory Visit: Payer: Self-pay | Admitting: Physician Assistant

## 2018-12-22 ENCOUNTER — Other Ambulatory Visit: Payer: Self-pay

## 2018-12-22 MED ORDER — APIXABAN 5 MG PO TABS: 5.0000 mg | ORAL_TABLET | Freq: Two times a day (BID) | ORAL | 3 refills | Status: DC

## 2019-01-03 ENCOUNTER — Other Ambulatory Visit: Payer: Self-pay

## 2019-01-03 ENCOUNTER — Ambulatory Visit: Payer: Self-pay | Admitting: Pharmacist

## 2019-01-03 ENCOUNTER — Other Ambulatory Visit: Payer: Self-pay | Admitting: Pharmacist

## 2019-01-03 NOTE — Patient Outreach (Signed)
[place] [facility]) Care Management  01/03/2019  OSBY SWEETIN 05/10/1945 [id number]   Patient was called for CSNP medication review. A woman answered the phone and said the patient was not available. She also asked that I not call him any more and that [employer] stop calling him so much. She said the patient has asked not to be called and that he has already reviewed his medications several times.  Plan: Close patient's pharmacy case per request. Send notice of closure to CSNP Nurse.  Elayne Guerin, PharmD, [place] Clinical Pharmacist [phone]

## 2019-01-03 NOTE — Patient Outreach (Signed)
  [place] [facility]) Care Management Chronic Special Needs Program    01/03/2019  Name: Patrick Duran, DOB: [birth year]  MRN: [id number]   [name] is enrolled in a chronic special needs plan.   RNCM received information from [place] that client's wife states that client is receiving too many phone calls and request "[employer] stop calling him so much". Ms. Luciana Axe states Mrs. Nishikawa states client received daily calls from [facility]. She request [facility] pharmacist not call client anymore.   Plan: Health care concierge notified.  Thea Silversmith, RN, MSN, [place] [phone]

## 2019-01-29 ENCOUNTER — Ambulatory Visit: Payer: HMO | Admitting: Podiatry

## 2019-01-29 ENCOUNTER — Other Ambulatory Visit: Payer: Self-pay

## 2019-01-29 ENCOUNTER — Encounter: Payer: Self-pay | Admitting: Podiatry

## 2019-01-29 DIAGNOSIS — M7752 Other enthesopathy of left foot: Secondary | ICD-10-CM | POA: Diagnosis not present

## 2019-01-29 DIAGNOSIS — M79675 Pain in left toe(s): Secondary | ICD-10-CM

## 2019-01-29 DIAGNOSIS — M79674 Pain in right toe(s): Secondary | ICD-10-CM | POA: Diagnosis not present

## 2019-01-29 DIAGNOSIS — B351 Tinea unguium: Secondary | ICD-10-CM

## 2019-01-29 DIAGNOSIS — M779 Enthesopathy, unspecified: Secondary | ICD-10-CM

## 2019-01-29 DIAGNOSIS — Q828 Other specified congenital malformations of skin: Secondary | ICD-10-CM | POA: Diagnosis not present

## 2019-01-29 NOTE — Progress Notes (Signed)
Subjective:   Patient ID: Patrick Duran, male   DOB: 73 y.o.   MRN: [id number]   HPI Patient points to the outside left foot states it has been real sore and cannot cut his own toenails and has had a bypass surgery done and needs to be more active   ROS      Objective:  Physical Exam  Neurovascular status intact inflammation pain around the fifth MPJ left with keratotic lesion formation and thick yellow brittle nailbeds 1-5 both feet     Assessment:  Inflammatory capsulitis fifth MPJ left with lesion formation that is painful when palpated and thick yellow brittle nailbeds 1-5 both feet that are bothersome     Plan:  Sterile prep injected the capsule of the fifth MPJ 3 mg Dexasone Kenalog 5 mg Xylocaine debrided lesion with sharp sterile instrumentation and debrided nailbeds 1-5 both feet with no iatrogenic bleeding noted

## 2019-02-17 ENCOUNTER — Other Ambulatory Visit: Payer: Self-pay | Admitting: Cardiology

## 2019-02-17 DIAGNOSIS — E782 Mixed hyperlipidemia: Secondary | ICD-10-CM

## 2019-03-05 ENCOUNTER — Other Ambulatory Visit: Payer: Self-pay

## 2019-03-05 NOTE — Patient Outreach (Addendum)
  [facility]) Care Management Chronic Special Needs Program  03/05/2019  Name: Patrick Duran DOB: 01-07-1946  MRN: [id number]  Mr. Tracey Stewart is enrolled in a chronic special needs plan for Diabetes. Per notes: Client/caregiver reports too many calls from [employer]/no call request confirmed with Healthcare concierge. RNCM updated care plan based on available data.  Per note last visit with primary care virtual visit  on 09/08/2018. Last A1C 7.0 on 10/12/2018. Per chart currently taking: Glipizide, Lantus, metformin.  Client with non ST elevated myocardial infarction on 06/04/2018 and underwent one vessel CABG on 06/09/2018. last visit with cardiologist, Dr. Einar Gip on 09/05/2018. Per cardiology note: "client doing well, no recurrence of angina, no dyspnea, has brief palpitations, blood pressure well controlled". Follow up in 6 months.  Goals Addressed            This Visit's Progress   . Client will verbalize knowledge of self management of Hypertension as evidences by BP reading of 140/90 or less; or as defined by provider   On track   . Maintain timely refills of diabetic medication as prescribed within the year .   On track   . COMPLETED: Obtain annual  Lipid Profile, LDL-C       Done 10/12/2018    . Obtain Annual Eye (retinal)  Exam    On track   . Obtain Annual Foot Exam   On track   . Obtain annual screen for micro albuminuria (urine) , nephropathy (kidney problems)   On track   . COMPLETED: Obtain Hemoglobin A1C at least 2 times per year       A1C checked at least twice this year.    . Visit Primary Care Provider or Endocrinologist at least 2 times per year    On track      Plan: RNCM will send updated care plan to client, send updated care plan to primary care. RNCM will outreach based on tier level within 6 months.    Thea Silversmith, RN, MSN, [place] [phone]   .

## 2019-03-06 DIAGNOSIS — E1121 Type 2 diabetes mellitus with diabetic nephropathy: Secondary | ICD-10-CM | POA: Diagnosis not present

## 2019-03-06 DIAGNOSIS — E782 Mixed hyperlipidemia: Secondary | ICD-10-CM | POA: Diagnosis not present

## 2019-03-07 LAB — CMP14+EGFR
ALT: 29 IU/L (ref 0–44)
AST: 25 IU/L (ref 0–40)
Albumin/Globulin Ratio: 2 (ref 1.2–2.2)
Albumin: 4.5 g/dL (ref 3.7–4.7)
Alkaline Phosphatase: 131 IU/L — ABNORMAL HIGH (ref 39–117)
BUN/Creatinine Ratio: 14 (ref 10–24)
BUN: 19 mg/dL (ref 8–27)
Bilirubin Total: 0.4 mg/dL (ref 0.0–1.2)
CO2: 24 mmol/L (ref 20–29)
Calcium: 9.5 mg/dL (ref 8.6–10.2)
Chloride: 103 mmol/L (ref 96–106)
Creatinine, Ser: 1.34 mg/dL — ABNORMAL HIGH (ref 0.76–1.27)
GFR calc Af Amer: 61 mL/min/{1.73_m2} (ref >59)
GFR calc non Af Amer: 53 mL/min/{1.73_m2} — ABNORMAL LOW (ref >59)
Globulin, Total: 2.3 g/dL (ref 1.5–4.5)
Glucose: 157 mg/dL — ABNORMAL HIGH (ref 65–99)
Potassium: 4.5 mmol/L (ref 3.5–5.2)
Sodium: 141 mmol/L (ref 134–144)
Total Protein: 6.8 g/dL (ref 6.0–8.5)

## 2019-03-07 LAB — LIPID PANEL WITH LDL/HDL RATIO
Cholesterol, Total: 137 mg/dL (ref 100–199)
HDL: 30 mg/dL — ABNORMAL LOW (ref >39)
LDL Chol Calc (NIH): 84 mg/dL (ref 0–99)
LDL/HDL Ratio: 2.8 ratio (ref 0.0–3.6)
Triglycerides: 130 mg/dL (ref 0–149)
VLDL Cholesterol Cal: 23 mg/dL (ref 5–40)

## 2019-03-12 ENCOUNTER — Ambulatory Visit: Payer: HMO | Admitting: Cardiology

## 2019-03-15 ENCOUNTER — Telehealth: Payer: Self-pay | Admitting: Family Medicine

## 2019-03-15 DIAGNOSIS — H2513 Age-related nuclear cataract, bilateral: Secondary | ICD-10-CM | POA: Diagnosis not present

## 2019-03-15 DIAGNOSIS — E119 Type 2 diabetes mellitus without complications: Secondary | ICD-10-CM | POA: Diagnosis not present

## 2019-03-15 NOTE — Telephone Encounter (Signed)
I called the patient to schedule AWV with Courtney, but there was no answer and no option to leave a message. If patient calls back, please schedule Medicare Wellness Visit at next available opening.  VDM (Dee-Dee) 

## 2019-03-19 ENCOUNTER — Encounter: Payer: Self-pay | Admitting: Cardiology

## 2019-03-19 ENCOUNTER — Other Ambulatory Visit: Payer: Self-pay

## 2019-03-19 ENCOUNTER — Ambulatory Visit (INDEPENDENT_AMBULATORY_CARE_PROVIDER_SITE_OTHER): Payer: HMO | Admitting: Cardiology

## 2019-03-19 VITALS — BP 171/84 | HR 77 | Temp 97.2°F | Ht 73.0 in | Wt 233.6 lb

## 2019-03-19 DIAGNOSIS — I10 Essential (primary) hypertension: Secondary | ICD-10-CM

## 2019-03-19 DIAGNOSIS — I25119 Atherosclerotic heart disease of native coronary artery with unspecified angina pectoris: Secondary | ICD-10-CM | POA: Diagnosis not present

## 2019-03-19 DIAGNOSIS — E782 Mixed hyperlipidemia: Secondary | ICD-10-CM | POA: Diagnosis not present

## 2019-03-19 DIAGNOSIS — Z951 Presence of aortocoronary bypass graft: Secondary | ICD-10-CM

## 2019-03-19 DIAGNOSIS — E1121 Type 2 diabetes mellitus with diabetic nephropathy: Secondary | ICD-10-CM

## 2019-03-19 DIAGNOSIS — I480 Paroxysmal atrial fibrillation: Secondary | ICD-10-CM | POA: Diagnosis not present

## 2019-03-19 MED ORDER — LOSARTAN POTASSIUM 25 MG PO TABS: 25.0000 mg | ORAL_TABLET | Freq: Every day | ORAL | 3 refills | Status: DC

## 2019-03-19 MED ORDER — ROSUVASTATIN CALCIUM 40 MG PO TABS: 40.0000 mg | ORAL_TABLET | Freq: Every day | ORAL | 3 refills | Status: DC

## 2019-03-19 MED ORDER — METOPROLOL TARTRATE 25 MG PO TABS: 25.0000 mg | ORAL_TABLET | Freq: Two times a day (BID) | ORAL | 3 refills | Status: AC

## 2019-03-19 NOTE — Progress Notes (Signed)
Primary Physician/Referring:  Vivi Barrack, MD  Patient ID: Patrick Duran, male    DOB: [birth year], 73 y.o.   MRN: [id number]  Chief Complaint  Patient presents with  . Atrial Fibrillation  . Coronary Artery Disease  . Follow-up    80mo  HPI:    Patrick Duran is a 73y.o. male  with CAD, leg edema, DM and PAF, presented on 11/06/2014 with acute coronary syndrome and paroxysmal atrial fibrillation with RVR, underwent angioplasty for instent restenosis of the mid to distal RCA placed on 04/15/2014. Due to recurrent restenosis in the right coronary artery, he underwent one-vessel CABG on 06/09/2018 with free right radial graft to RCA and modified maze procedure followed by left atrial appendage clipping for A. Fib.  He has history of hypertension, Benign positional vertigo, chronic leg edema and venous claudication, hyperlipidemia and diabetes mellitus with stage 3 CKD. This is a six-month office visit, states that over the past few weeks he has noticed his blood pressure to be uncontrolled, associated with this he just doesn't feel well.  Past Medical History:  Diagnosis Date  . Arthritis    "fingers, right hip" (11/07/2014)  . Complication of anesthesia    "trembling during my 1st cath"  . Coronary artery disease   . Easy bruising   . GERD (gastroesophageal reflux disease)   . Headache    "monthly" (11/07/2014)  . 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 History of stomach ulcers   . Hyperlipidemia   . Hypothyroidism   . Myocardial infarction ([facility] 03/2014  . PONV (postoperative nausea and vomiting)   . Stroke ([facility]    questionably noted on a MRI  . Trouble swallowing   . Type II diabetes mellitus ([facility]   . Vertigo    Past Surgical History:  Procedure Laterality Date  . ANTERIOR CERVICAL DECOMP/DISCECTOMY FUSION  2004  . CARDIAC CATHETERIZATION  2000  . CARDIAC CATHETERIZATION  04/15/2014   Procedure: CORONARY STENT INTERVENTION;  Surgeon: JLaverda Page MD;  [facility];  Service: Cardiovascular;;  RCA  . CARDIAC CATHETERIZATION N/A 11/07/2014   Procedure: Left Heart Cath and Coronary Angiography;  Surgeon: JAdrian Prows MD;  [facility];  Service: Cardiovascular;  Laterality: N/A;  . CLIPPING OF ATRIAL APPENDAGE N/A 06/09/2018   Procedure: CLIPPING OF ATRIAL APPENDAGE using AtriCure Clip size 35;  Surgeon: HMelrose Nakayama MD;  [facility]  Service: Open Heart Surgery;  Laterality: N/A;  . CORONARY ANGIOGRAPHY N/A 06/05/2018   Procedure: CORONARY ANGIOGRAPHY;  Surgeon: PNigel Mormon MD;  [facility];  Service: Cardiovascular;  Laterality: N/A;  . CORONARY ANGIOPLASTY WITH STENT PLACEMENT  2000; 11/07/2014   "3 stents; 1 stent"  . CORONARY ARTERY BYPASS GRAFT N/A 06/09/2018   Procedure: CORONARY ARTERY BYPASS GRAFTING (CABG) x one using radial artery;  Surgeon: HMelrose Nakayama MD;  [place] [facility]  Service: Open Heart Surgery;  Laterality: N/A;  . [facility] LEFT HEART CATHETERIZATION WITH CORONARY ANGIOGRAM N/A 04/15/2014   Procedure: LEFT HEART CATHETERIZATION WITH CORONARY ANGIOGRAM;  Surgeon: JLaverda Page MD;  [facility];  Service: Cardiovascular;  Laterality: N/A;  . RADIAL ARTERY HARVEST Left 06/09/2018   Procedure: RADIAL ARTERY HARVEST;  Surgeon: HMelrose Nakayama MD;  Location: [facility]  Service: Open Heart Surgery;  Laterality: Left;  . TEE WITHOUT CARDIOVERSION N/A 06/09/2018   Procedure: TRANSESOPHAGEAL ECHOCARDIOGRAM (TEE);  Surgeon: Melrose Nakayama, MD;  Location: [place];  Service: Open Heart Surgery;  Laterality: N/A;   Social History   Socioeconomic History  . Marital status: Married    Spouse name: Not on file  . Number of children: 3  . Years of education: Not on file  . Highest education level: Not on file  Occupational History  . Not on file   Tobacco Use  . Smoking status: Former Smoker    Packs/day: 1.50    Years: 34.00    Pack years: 51.00    Types: Cigarettes    Quit date: 07/27/2000    Years since quitting: 18.6  . Smokeless tobacco: Never Used  . Tobacco comment: "stopped smoking in ~ 2000"  Substance and Sexual Activity  . Alcohol use: No  . Drug use: No    Comment: 11/07/2014 "experimented in the 1960's and 1970's; nothing since"  . Sexual activity: Not Currently  Other Topics Concern  . Not on file  Social History Narrative  . Not on file   Social Determinants of Health   Financial Resource Strain:   . Difficulty of Paying Living Expenses: Not on file  Food Insecurity:   . Worried About Charity fundraiser in the Last Year: Not on file  . Ran Out of Food in the Last Year: Not on file  Transportation Needs:   . Lack of Transportation (Medical): Not on file  . Lack of Transportation (Non-Medical): Not on file  Physical Activity:   . Days of Exercise per Week: Not on file  . Minutes of Exercise per Session: Not on file  Stress:   . Feeling of Stress : Not on file  Social Connections:   . Frequency of Communication with Friends and Family: Not on file  . Frequency of Social Gatherings with Friends and Family: Not on file  . Attends Religious Services: Not on file  . Active Member of Clubs or Organizations: Not on file  . Attends Archivist Meetings: Not on file  . Marital Status: Not on file  Intimate Partner Violence:   . Fear of Current or Ex-Partner: Not on file  . Emotionally Abused: Not on file  . Physically Abused: Not on file  . Sexually Abused: Not on file   ROS  Review of Systems  Constitution: Negative for chills, decreased appetite, malaise/fatigue and weight gain.  Eyes: Negative for visual disturbance.  Cardiovascular: Positive for dyspnea on exertion (STABLE) and leg swelling (STABLE). Negative for claudication, palpitations and syncope.  Endocrine: Negative for cold  intolerance.  Hematologic/Lymphatic: Does not bruise/bleed easily.  Musculoskeletal: Negative for joint swelling.  Gastrointestinal: Negative for abdominal pain, anorexia and change in bowel habit.  Neurological: Positive for dizziness (has chronic vertigo and stable) and headaches (with elevated BP occasionally). Negative for light-headedness and numbness.  Psychiatric/Behavioral: Negative for depression and substance abuse.  All other systems reviewed and are negative.  Objective  Blood pressure (!) 171/84, pulse 77, temperature (!) 97.2 F (36.2 C), height 6' 1"  (1.854 m), weight 233 lb 9.6 oz (106 kg), SpO2 98 %.  Vitals with BMI 03/19/2019 09/05/2018 08/08/2018  Height 6' 1"  6' 1"  6' 1"   Weight 233 lbs 10 oz 230 lbs 10 oz 229 lbs 3 oz  BMI 30.83 85.88 50.27  Systolic 741 287 867  Diastolic 84 80 68  Pulse 77 68 68  Physical Exam  Constitutional: He appears well-developed and well-nourished. No distress.  HENT:  Head: Atraumatic.  Eyes: Conjunctivae are normal.  Neck: No JVD present. No thyromegaly present.  Cardiovascular: Normal rate, regular rhythm, normal heart sounds, intact distal pulses and normal pulses. Exam reveals no gallop.  No murmur heard. Trace ankle edema. Chronic venostasis changes left leg noted. Normal carotid pulse and peripheral pulse.  Pulmonary/Chest: Effort normal and breath sounds normal.  Sternotomy scar noted  Abdominal: Soft. Bowel sounds are normal.  Musculoskeletal:        General: Normal range of motion.     Cervical back: Neck supple.  Neurological: He is alert.  Skin: Skin is warm and dry.  Psychiatric: He has a normal mood and affect.   Laboratory examination:   Recent Labs    06/14/18 0232 10/12/18 0822 03/06/19 0813  NA 138 140 141  K 3.3* 4.5 4.5  CL 105 101 103  CO2 27 21 24   GLUCOSE 92 135* 157*  BUN 27* 20 19  CREATININE 1.70* 1.50* 1.34*  CALCIUM 8.8* 9.2 9.5  GFRNONAA 40* 46* 53*  GFRAA 46* 53* 61   estimated  creatinine clearance is 63.6 mL/min (A) (by C-G formula based on SCr of 1.34 mg/dL (H)).  CMP Latest Ref Rng & Units 03/06/2019 10/12/2018 06/14/2018  Glucose 65 - 99 mg/dL 157(H) 135(H) 92  BUN 8 - 27 mg/dL 19 20 27(H)  Creatinine 0.76 - 1.27 mg/dL 1.34(H) 1.50(H) 1.70(H)  Sodium 134 - 144 mmol/L 141 140 138  Potassium 3.5 - 5.2 mmol/L 4.5 4.5 3.3(L)  Chloride 96 - 106 mmol/L 103 101 105  CO2 20 - 29 mmol/L 24 21 27   Calcium 8.6 - 10.2 mg/dL 9.5 9.2 8.8(L)  Total Protein 6.0 - 8.5 g/dL 6.8 6.7 -  Total Bilirubin 0.0 - 1.2 mg/dL 0.4 0.5 -  Alkaline Phos 39 - 117 IU/L 131(H) 119(H) -  AST 0 - 40 IU/L 25 21 -  ALT 0 - 44 IU/L 29 19 -   CBC Latest Ref Rng & Units 10/12/2018 06/12/2018 06/11/2018  WBC 3.4 - 10.8 x10E3/uL 4.7 7.8 7.4  Hemoglobin 13.0 - 17.7 g/dL 13.0 9.1(L) 9.1(L)  Hematocrit 37.5 - 51.0 % 37.9 27.9(L) 28.5(L)  Platelets 150 - 450 x10E3/uL 233 165 153   Lipid Panel     Component Value Date/Time   CHOL 137 03/06/2019 0813   TRIG 130 03/06/2019 0813   HDL 30 (L) 03/06/2019 0813   CHOLHDL 4.9 06/06/2018 0515   VLDL 28 06/06/2018 0515   LDLCALC 84 03/06/2019 0813   LDLDIRECT 106.6 10/12/2007 0906   HEMOGLOBIN A1C Lab Results  Component Value Date   HGBA1C 7.0 (H) 10/12/2018   MPG 119.76 06/06/2018   TSH Recent Labs    06/05/18 1433 10/12/18 0822  TSH 1.426 4.610*     Medications and allergies   Allergies  Allergen Reactions  . Codeine Nausea Only  . Codeine Sulfate     REACTION: nausea  . Eggs Or Egg-Derived Products   . Hydrocodone Nausea Only    nausea  . Oxycodone Nausea Only  . Peanut-Containing Drug Products     And all nuts  . Percocet [Oxycodone-Acetaminophen]     nausea  . Tramadol      Current Outpatient Medications  Medication Instructions  . acetaminophen (TYLENOL) 500 mg, Every 6 hours PRN  . Alpha-Lipoic Acid 300 MG CAPS Daily  . amiodarone (PACERONE) 100 mg, Oral, Daily  . apixaban (ELIQUIS) 5 mg,  Oral, 2 times daily  . aspirin  81 mg, Oral, Daily  . co-enzyme Q-10 120 mg, Daily  . dimenhyDRINATE (DRAMAMINE) 50 mg, Oral, Every 8 hours PRN  . ezetimibe (ZETIA) 10 MG tablet TAKE ONE TABLET BY MOUTH DAILY  . glipiZIDE (GLUCOTROL XL) 10 mg, Oral, 2 times daily  . glucose blood (ONE TOUCH ULTRA TEST) test strip Check blood sugar 2-3 times daily  . Insulin Glargine (LANTUS) 20 Units, Subcutaneous, As directed, 15-35 units q a.m.  Marland Kitchen ketoconazole (NIZORAL) 2 % cream 1 application, Topical, 2 times daily  . levothyroxine (SYNTHROID) 125 mcg, Oral, Daily before breakfast  . losartan (COZAAR) 25 mg, Oral, Daily  . meclizine (ANTIVERT) 12.5 mg, Oral, 3 times daily PRN  . metFORMIN (GLUCOPHAGE) 1000 MG tablet 2 times daily  . metoprolol tartrate (LOPRESSOR) 25 mg, Oral, 2 times daily  . Multiple Vitamin (MULTIVITAMIN PO) 1 tablet, Oral, Daily  . pantoprazole (PROTONIX) 40 mg, Oral, Daily  . POTASSIUM GLUCONATE PO 1 tablet, Oral, Daily  . rosuvastatin (CRESTOR) 40 mg, Oral, Daily  . triamcinolone cream (KENALOG) 0.1 % 1 application, Topical, 2 times daily  . vitamin B-12 (CYANOCOBALAMIN) 100 mcg, Oral, Daily  . Zinc Acetate, Oral, (ZINC ACETATE PO) 1 tablet, Oral, Daily   Radiology:  No results found.  Cardiac Studies:   Coronary Angiography 06/05/2018 LM: Normal. LAD: Prox 30%, mid 40% focal stenoses. LCx: Distal 40% focal stenosis. RCA: Local 90% ISR in prox-mid, mid RCA. Distal RCA 70% edge restenosis. Diffuse 80% ISR in distal RCA  Echocardiogram 06/05/2018 : 1. The left ventricle has normal systolic function, with an ejection fraction of 55-60%. Inferior hypokinesis. The cavity size was normal. There is mild concentric left ventricular hypertrophy. Left ventricular diastolic Doppler parameters are consistent with pseudonormalization.  2. The right ventricle has normal systolic function. The cavity was normal. There is no increase in right ventricular wall thickness.  3. Left atrial size was moderately dilated.  4.  Right atrial size was mildly dilated.  Assessment     ICD-10-CM   1. Paroxysmal atrial fibrillation (HCC)  I48.0 EKG 12-Lead    CBC    TSH  2. Coronary artery disease involving native coronary artery of native heart with angina pectoris (HCC)  I25.119 metoprolol tartrate (LOPRESSOR) 25 MG tablet  3. Hx of CABG 06/09/2018 x 1 Free Radial graft to RCA, modified Maze procedure, clipping LAA with  AtriCure Clip size 35.  Z95.1   4. Primary hypertension  I10 losartan (COZAAR) 25 MG tablet    CMP14+EGFR    TSH  5. Mixed hyperlipidemia  E78.2 rosuvastatin (CRESTOR) 40 MG tablet    Lipid Panel With LDL/HDL Ratio    Lipoprotein A (LPA)  6. Controlled type 2 diabetes mellitus with diabetic nephropathy, without long-term current use of insulin (HCC)  E11.21 Hgb A1c w/o eAG    EKG 03/19/2019: Normal sinus rhythm at the rate of 76 bpm, left axis deviation, left anterior fascicular block.  Poor R-wave progression, probably normal variant.  Nonspecific T abnormality.  Borderline low voltage complexes. No significant change from EKG 09/05/2018   Recommendations:   Meds ordered this encounter  Medications  . metoprolol tartrate (LOPRESSOR) 25 MG tablet    Sig: Take 1 tablet (25 mg total) by mouth 2 (two) times daily.    Dispense:  180 tablet    Refill:  3  . rosuvastatin (CRESTOR) 40 MG tablet    Sig: Take 1 tablet (40 mg total) by mouth daily.  Dispense:  90 tablet    Refill:  3  . losartan (COZAAR) 25 MG tablet    Sig: Take 1 tablet (25 mg total) by mouth daily.    Dispense:  90 tablet    Refill:  3    He is presently doing well except has noticed elevated blood pressure, inadvertently his discontinue metoprolol.  I'll restart her metoprolol back.  I will also start him on losartan only 25 mg daily as he had stage III chronic kidney disease due to diabetes mellitus want to be careful with not precipitating acute renal failure.  Extensive discussion regarding weight loss was held with the  patient.  Lipids are not at goal.  Lipids are uncontrolled, discontinue fenofibrate, over-the-counter niacin, Lipitor, switching to Crestor 40 mg daily.  Labs ordered for 4 weeks, including CMP, CBC, TSH, A1c and lipid profile testing. "Total time spent with patient was  40 minutes and greater than 50% of that time was spent in face to face discussion, counseling and coordination care"   We will screening for "[facility]" elevated LP(a) trial. He is willing.   Adrian Prows, MD, [facility] 03/19/2019, 3:58 PM [employer] [place] Pager: [phone] Office: [phone]

## 2019-03-19 NOTE — Patient Instructions (Signed)
Blood work in 4 weeks. Office visit in 6 weeks

## 2019-04-10 DIAGNOSIS — H2513 Age-related nuclear cataract, bilateral: Secondary | ICD-10-CM | POA: Diagnosis not present

## 2019-04-10 DIAGNOSIS — H25043 Posterior subcapsular polar age-related cataract, bilateral: Secondary | ICD-10-CM | POA: Diagnosis not present

## 2019-04-10 DIAGNOSIS — H18413 Arcus senilis, bilateral: Secondary | ICD-10-CM | POA: Diagnosis not present

## 2019-04-10 DIAGNOSIS — H2511 Age-related nuclear cataract, right eye: Secondary | ICD-10-CM | POA: Diagnosis not present

## 2019-04-10 DIAGNOSIS — H25013 Cortical age-related cataract, bilateral: Secondary | ICD-10-CM | POA: Diagnosis not present

## 2019-04-14 ENCOUNTER — Other Ambulatory Visit: Payer: Self-pay | Admitting: Cardiology

## 2019-04-18 ENCOUNTER — Encounter: Payer: Self-pay | Admitting: Cardiology

## 2019-04-23 ENCOUNTER — Other Ambulatory Visit: Payer: Self-pay

## 2019-04-23 NOTE — Patient Outreach (Signed)
  [facility]) Care Management Chronic Special Needs Program    04/23/2019  Name: Patrick Duran, DOB: Aug 25, 1945  MRN: [id number]   [name] is enrolled in a chronic special needs plan for Diabetes.  RNCM received notification that client's wife called the 24 hour nurse advice line on 04/20/2018-report of dizziness, nausea, headache. Mrs. Patrick Duran questioned if eye drop medication could be the cause(client scheduled for cataract surgery on 04/23/2019). Per  24 hour nurse advice line recommendation was to see provider within 24 hours and to call prescribing doctors office to ask about the eye drop medication.    RNCM called to follow up. RNCM spoke with client's wife per designated party release/consented to speak with wife. Per Mrs. 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 She states, client refused to go see a provider this weekend. Per Mrs Patrick Duran, "he took a Dramamine, and when he woke up, he was fine".  Mrs. Patrick Duran reports Dramamine resolved the problem and client has not had any issues since. She reports client continued to use the eye drops as scheduled in preparation for cataract surgery without any issues. She states client was supposed to have cataract surgery today, but due to changes made to the original time, [name] declined to have it today. RNCM instructed Mrs. Patrick Duran to contact provider if client experienced the same signs/symptoms again. Mrs. Patrick Duran voiced understanding.  Mrs Patrick Duran assisted with scheduling a time for Patrick Duran to call client back next week to complete his health risk assessment.  Plan: RNCM will attempt health risk assessment next week.   Kathyrn Sheriff, RN, MSN, [facility] Chronic Care Management Coordinator [facility] [phone]

## 2019-04-30 ENCOUNTER — Ambulatory Visit: Payer: HMO | Admitting: Cardiology

## 2019-05-01 DIAGNOSIS — E1121 Type 2 diabetes mellitus with diabetic nephropathy: Secondary | ICD-10-CM | POA: Diagnosis not present

## 2019-05-01 DIAGNOSIS — E782 Mixed hyperlipidemia: Secondary | ICD-10-CM | POA: Diagnosis not present

## 2019-05-01 DIAGNOSIS — I480 Paroxysmal atrial fibrillation: Secondary | ICD-10-CM | POA: Diagnosis not present

## 2019-05-01 DIAGNOSIS — I10 Essential (primary) hypertension: Secondary | ICD-10-CM | POA: Diagnosis not present

## 2019-05-02 ENCOUNTER — Encounter: Payer: Self-pay | Admitting: Podiatry

## 2019-05-02 ENCOUNTER — Ambulatory Visit: Payer: HMO | Admitting: Podiatry

## 2019-05-02 ENCOUNTER — Other Ambulatory Visit: Payer: Self-pay

## 2019-05-02 DIAGNOSIS — M79675 Pain in left toe(s): Secondary | ICD-10-CM | POA: Diagnosis not present

## 2019-05-02 DIAGNOSIS — M79674 Pain in right toe(s): Secondary | ICD-10-CM | POA: Diagnosis not present

## 2019-05-02 DIAGNOSIS — B351 Tinea unguium: Secondary | ICD-10-CM

## 2019-05-02 DIAGNOSIS — Z794 Long term (current) use of insulin: Secondary | ICD-10-CM | POA: Diagnosis not present

## 2019-05-02 DIAGNOSIS — E119 Type 2 diabetes mellitus without complications: Secondary | ICD-10-CM

## 2019-05-02 LAB — CBC
Hematocrit: 38.9 % (ref 37.5–51.0)
Hemoglobin: 13.2 g/dL (ref 13.0–17.7)
MCH: 31.4 pg (ref 26.6–33.0)
MCHC: 33.9 g/dL (ref 31.5–35.7)
MCV: 92 fL (ref 79–97)
Platelets: 207 10*3/uL (ref 150–450)
RBC: 4.21 x10E6/uL (ref 4.14–5.80)
RDW: 12.3 % (ref 11.6–15.4)
WBC: 4.5 10*3/uL (ref 3.4–10.8)

## 2019-05-02 LAB — CMP14+EGFR
ALT: 25 IU/L (ref 0–44)
AST: 20 IU/L (ref 0–40)
Albumin/Globulin Ratio: 2.2 (ref 1.2–2.2)
Albumin: 4.4 g/dL (ref 3.7–4.7)
Alkaline Phosphatase: 139 IU/L — ABNORMAL HIGH (ref 39–117)
BUN/Creatinine Ratio: 12 (ref 10–24)
BUN: 13 mg/dL (ref 8–27)
Bilirubin Total: 0.7 mg/dL (ref 0.0–1.2)
CO2: 22 mmol/L (ref 20–29)
Calcium: 9.2 mg/dL (ref 8.6–10.2)
Chloride: 104 mmol/L (ref 96–106)
Creatinine, Ser: 1.1 mg/dL (ref 0.76–1.27)
GFR calc Af Amer: 77 mL/min/{1.73_m2} (ref >59)
GFR calc non Af Amer: 67 mL/min/{1.73_m2} (ref >59)
Globulin, Total: 2 g/dL (ref 1.5–4.5)
Glucose: 134 mg/dL — ABNORMAL HIGH (ref 65–99)
Potassium: 4.2 mmol/L (ref 3.5–5.2)
Sodium: 143 mmol/L (ref 134–144)
Total Protein: 6.4 g/dL (ref 6.0–8.5)

## 2019-05-02 LAB — LIPOPROTEIN A (LPA): Lipoprotein (a): 20 nmol/L (ref <75.0)

## 2019-05-02 LAB — TSH: TSH: 4.21 u[IU]/mL (ref 0.450–4.500)

## 2019-05-02 LAB — LIPID PANEL WITH LDL/HDL RATIO
Cholesterol, Total: 108 mg/dL (ref 100–199)
HDL: 28 mg/dL — ABNORMAL LOW (ref >39)
LDL Chol Calc (NIH): 56 mg/dL (ref 0–99)
LDL/HDL Ratio: 2 ratio (ref 0.0–3.6)
Triglycerides: 133 mg/dL (ref 0–149)
VLDL Cholesterol Cal: 24 mg/dL (ref 5–40)

## 2019-05-02 LAB — HGB A1C W/O EAG: Hgb A1c MFr Bld: 7.1 % — ABNORMAL HIGH (ref 4.8–5.6)

## 2019-05-02 NOTE — Patient Outreach (Addendum)
[facility]) Care Management Chronic Special Needs Program  05/02/2019  Name: Patrick Duran DOB: [birth year]  MRN: [id number]  [name] is enrolled in a chronic special needs plan for Diabetes. Chronic Care Management Coordinator telephoned client to review health risk assessment and to develop individualized care plan. RNCM reviewed the chronic care management program, importance of client participation, and taking their care plan to all provider appointments and inpatient facilities.    Subjective: client reports a history of diabetes, Heart disease, HTN, MI, atrial fibrillation and stroke. He states he is independent. Denies any difficulty getting to provider visits and reports is able to afford medications. Client denies any issues with medications.   [name] reports a history of "balance" issues and states provider is aware and is prescribed meclizine as needed. [name] states "it helps". He states it flairs during certain weather conditions and adds that was his problem when his wife called the nurse advice line last week. [name] denies any dizziness since then. He reports one fall about 3 months ago getting out of the chair. He states the chair he was using was unbalanced and caused him to loose his balance. He states he no longer sits in that chair.  Client reports no serious injury. Client is receptive to information on fall prevention. He denies any additional falls.  History of diabetes- last A1C 7.0 on 10/12/2018. Client reports he was seen yesterday and full lab work including A1C was done.  Reports he has an advanced directive and HCPOA. Does not want to make any changes.   PHQ1-1  Reports, "I am having some problem with depression, with Covid and the political climate". Client states he will discuss with primary care at next appointment. Client declines social work referral. Provided the behavioral health help line contact number [phone].  Instructed can call as needed.   Upcoming appointments: Next appointment with primary care 05/04/2019  Next appointment with cardiologist 05/07/2019  Goals Addressed            This Visit's Progress   . Client verbalizes knowledge of Heart Attack self management skills within the next 3-6 months.        Follow up with provider visits as scheduled. Mailed education: "lowering your risk of heart disease". Please review and call if you have any questions. Please take medications as prescribed.    . Client will not report change from baseline and no repeated symptoms of stroke with in the next 6 months       Follow up with your provider as scheduled. Take your medications as scheduled. Know the signs/symptoms of stroke.  Face Drooping  Arm Weakness  Speech difficulty  Time to call 911 Mailed education:"lowering risk of having another stroke". Please review and call if you have any questions.    . Client will report no worsening of symptoms of Atrial Fibrillation within the next 3-6 months.       Please follow up with your provider as scheduled. Please take medications as prescribed. Mailed education material "living with atrial fibrillation". Please review and call if you have any questions.    . Client will report no worsening of symptoms related to heart disease within the next 3-6 months.       Verbalized continued improvement. Continues to follow up with providers.    . Client will verbalize fall prevention strategies within the next 6 months.       Please review educational material: preventing falls. Please  follow up with provider as scheduled and contact your provider if condition worsens. Take medication as prescribed by provider. Please use assistive device as recommended by provider.    . Client will verbalize knowledge of self management of Hypertension as evidences by BP reading of 140/90 or less; or as defined by provider   No change    Client reports blood pressure  readings between 140/80-150/80. Follow up with your doctor as scheduled. Ask your doctor, "what is my target blood pressure range". Monitor your blood pressure periodically and take to your doctor appointments. Continue to take your medications as recommended by your provider. Review education material, "High blood pressure: what you can do". Please call if you have any questions.     Marland Kitchen HEMOGLOBIN A1C < 7.0       Last A1C done 05/01/2019 was 7.1  Ask your doctor, "what is my Target A1C goal?" Ask your doctor, "what is my target blood sugar range?"  Diabetes self management actions: Glucose monitoring per provider recommendations Eat Healthy Check feet daily Visit provider every 3-6 months as directed Hbg A1C level every 3-6 months. Eye Exam yearly    . Maintain timely refills of diabetic medication as prescribed within the year .   On track    Denies any difficulty obtaining medications. You are taking greater than 10 medications. I have asked one of our pharmacist from [facility] to contact you to complete a review of your medications with you. Please contact your assigned RN Case Manager if you have any difficulty getting your medications.    Clydene Pugh Annual Eye (retinal)  Exam    On track    Client reports eye exam January 2021. Discussed importance of annual eye exam. It is important for you to follow up with your provider annually. You may contact the Health Care Concierge if you need to find an ophthalmologist that is in the network.    . Obtain Annual Foot Exam   On track    Client reports done year 2020-podiatry exam noted 01/29/2019 It Is important for you to follow up with your provider for yearly physical and recommended labs/procedures.    . Obtain annual screen for micro albuminuria (urine) , nephropathy (kidney problems)   On track    Client reports has been completed year 2020.  It is important to follow up with your provider for yearly physical and  recommended labs.    . Visit Primary Care Provider or Endocrinologist at least 2 times per year    On track    Primary care provider visit 05/04/2018 and 09/08/2018 It is important to follow up with your primary care provider for yearly physical and recommended labs. Primary care visit scheduled 05/04/2019 Cardiologist visit scheduled 05/07/2019       RNCM discussed covid precautions. Client reports he is undecided about the vaccine. RNCM encouraged client to discuss with his primary care provider on Friday.   RNCM encouraged client to call the 24 hour nurse advice as needed. RNCM encouraged client to call healthcare concierge for benefits questions and to call RNCM as needed.  Provided the behavioral health help line contact number  [phone].  Plan: send updated care plan to client; send updated care plan to primary care. Send Agricultural engineer. RNCM will follow up outreach in 6 months.    Kathyrn Sheriff, RN, MSN, [facility] Chronic Care Management Coordinator [facility] [phone]  Addendum: 05/03/2018 RNCM discussed follow up call in 6 months with client and  client was agreeable for RNCM to call him in 6 months.

## 2019-05-02 NOTE — Progress Notes (Signed)
Complaint:  Visit Type: Patient returns to my office for continued preventative foot care services. Complaint: Patient states" my nails have grown long and thick and become painful to walk and wear shoes" Patient has been diagnosed with DM with no foot complications. The patient presents for preventative foot care services.  Podiatric Exam: Vascular: dorsalis pedis and posterior tibial pulses are palpable bilateral. Capillary return is immediate. Temperature gradient is WNL. Skin turgor WNL  Sensorium: Normal Semmes Weinstein monofilament test. Normal tactile sensation bilaterally. Nail Exam: Pt has thick disfigured discolored nails with subungual debris noted bilateral entire nail hallux through fifth toenails Ulcer Exam: There is no evidence of ulcer or pre-ulcerative changes or infection. Orthopedic Exam: Muscle tone and strength are WNL. No limitations in general ROM. No crepitus or effusions noted. Foot type and digits show no abnormalities. Bony prominences are unremarkable. Skin: No Porokeratosis. No infection or ulcers.  Healed skin lesion dorsum right foot.  Diagnosis:  Onychomycosis, , Pain in right toe, pain in left toes  Treatment & Plan Procedures and Treatment: Consent by patient was obtained for treatment procedures.   Debridement of mycotic and hypertrophic toenails, 1 through 5 bilateral and clearing of subungual debris. No ulceration, no infection noted.  Return Visit-Office Procedure: Patient instructed to return to the office for a follow up visit 3 months for continued evaluation and treatment.    Helane Gunther DPM

## 2019-05-03 NOTE — Progress Notes (Signed)
A1c 7.1%, otherwise normal CBC, CMP and lipid profile.  TSH is normal.

## 2019-05-04 ENCOUNTER — Other Ambulatory Visit: Payer: Self-pay | Admitting: Pharmacist

## 2019-05-04 ENCOUNTER — Ambulatory Visit (INDEPENDENT_AMBULATORY_CARE_PROVIDER_SITE_OTHER): Payer: HMO | Admitting: Family Medicine

## 2019-05-04 ENCOUNTER — Ambulatory Visit (INDEPENDENT_AMBULATORY_CARE_PROVIDER_SITE_OTHER): Payer: HMO

## 2019-05-04 ENCOUNTER — Encounter: Payer: Self-pay | Admitting: Family Medicine

## 2019-05-04 ENCOUNTER — Other Ambulatory Visit: Payer: Self-pay

## 2019-05-04 VITALS — BP 130/64 | HR 66 | Temp 97.2°F | Ht 73.0 in | Wt 237.0 lb

## 2019-05-04 VITALS — BP 130/64 | Temp 97.2°F | Ht 73.0 in | Wt 237.0 lb

## 2019-05-04 DIAGNOSIS — Z0000 Encounter for general adult medical examination without abnormal findings: Secondary | ICD-10-CM

## 2019-05-04 DIAGNOSIS — F320 Major depressive disorder, single episode, mild: Secondary | ICD-10-CM

## 2019-05-04 DIAGNOSIS — R5381 Other malaise: Secondary | ICD-10-CM | POA: Diagnosis not present

## 2019-05-04 DIAGNOSIS — F419 Anxiety disorder, unspecified: Secondary | ICD-10-CM

## 2019-05-04 MED ORDER — DIAZEPAM 5 MG PO TABS: 5.0000 mg | ORAL_TABLET | Freq: Two times a day (BID) | ORAL | 1 refills | Status: AC | PRN

## 2019-05-04 MED ORDER — CITALOPRAM HYDROBROMIDE 20 MG PO TABS: 20.0000 mg | ORAL_TABLET | Freq: Every day | ORAL | 3 refills | Status: DC

## 2019-05-04 NOTE — Assessment & Plan Note (Signed)
Discussed options.  We will start Celexa 20 mg daily.  He will follow-up with me in couple weeks via MyChart.  Discuss potential side effects.

## 2019-05-04 NOTE — Patient Outreach (Signed)
  [facility]) Care Management Chronic Special Needs Program    05/04/2019  Name: Patrick Duran, DOB: [birth year]  MRN: [id number]   [name] is enrolled in a chronic special needs plan. Care Coordination with [facility] pharmacist. RNCM received notification from [facility] pharmacist that client/spouse request to continue to be on the "do not call list". Message sent to [facility].    Kathyrn Sheriff, RN, MSN, [facility] Chronic Care Management Coordinator [facility] [phone]

## 2019-05-04 NOTE — Patient Instructions (Signed)
It was very nice to see you today!  Please try the celexa. Take 1 tablet once daily.  I will refill your diazepam today.  Let me know if you would like to see a physical therapist.   Let me know how the medication is working for you in a few weeks.   Take care, Dr Jimmey Ralph  Please try these tips to maintain a healthy lifestyle:   Eat at least 3 REAL meals and 1-2 snacks per day.  Aim for no more than 5 hours between eating.  If you eat breakfast, please do so within one hour of getting up.    Each meal should contain half fruits/vegetables, one quarter protein, and one quarter carbs (no bigger than a computer mouse)   Cut down on sweet beverages. This includes juice, soda, and sweet tea.     Drink at least 1 glass of water with each meal and aim for at least 8 glasses per day   Exercise at least 150 minutes every week.

## 2019-05-04 NOTE — Addendum Note (Signed)
Addended by: Colletta Maryland on: 05/04/2019 07:24 AM   Modules accepted: Orders

## 2019-05-04 NOTE — Patient Outreach (Signed)
[facility]) Care Management  [facility]   05/04/2019  Patrick Duran Jan 28, 1946 [id number]  Reason for referral: Medication Review  Referral source: [employer] C-SNP Care Manager with [facility] Current insurance: [employer] C-SNP  Successful call with patient's spouse.  She declines discussion with [facility] pharmacy and declines medication review.  She reports she receives "5-6 calls each week" from pharmacists and other "people" wanting to review patient's medications.  She requests that patient be put on a "do not call" list with [facility] and [facility].   Plan: . Will close [facility] pharmacy case and update [facility] C-SNP RN Case Manager Kathyrn Sheriff. Am happy to assist in the future if patient willing to engage.   Haynes Hoehn, PharmD, [facility] Clinical Pharmacist [facility] [phone]

## 2019-05-04 NOTE — Progress Notes (Signed)
Subjective:   Patrick Duran is a 74 y.o. male who presents for Medicare Annual/Subsequent preventive examination.  Review of Systems:   Cardiac Risk Factors include: advanced age (>7men, >65 women);hypertension;male gender;diabetes mellitus;sedentary lifestyle    Objective:    Vitals: BP 130/64   Temp (!) 97.2 F (36.2 C) (Temporal)   Ht 6\' 1"  (1.854 m)   Wt 236 lb 15.9 oz (107.5 kg)   SpO2 99%   BMI 31.27 kg/m   Body mass index is 31.27 kg/m.  Advanced Directives 05/04/2019 05/02/2019 09/11/2018 06/11/2018 06/04/2018 11/07/2014 11/05/2014  Does Patient Have a Medical Advance Directive? Yes Yes Yes No No Yes Yes  Type of Paramedic of [place];Living will [place];Living will  Does patient want to make changes to medical advance directive? No - Patient declined No - Patient declined No - Patient declined - - No - Patient declined -  Copy of [place] in Chart? No - copy requested - - - - No - copy requested -  Would patient like information on creating a medical advance directive? - - - No - Patient declined No - Patient declined - Yes - Educational materials given    Tobacco Social History   Tobacco Use  Smoking Status Former Smoker  . Packs/day: 1.50  . Years: 34.00  . Pack years: 51.00  . Types: Cigarettes  . Quit date: 07/27/2000  . Years since quitting: 18.7  Smokeless Tobacco Never Used  Tobacco Comment   "stopped smoking in ~ 2000"     Counseling given: Not Answered Comment: "stopped smoking in ~ 2000"   Clinical Intake:  Pre-visit preparation completed: Yes  Pain : No/denies pain  Diabetes: Yes CBG done?: No Did pt. bring in CBG monitor from home?: No  How often do you need to have someone help you when you read instructions, pamphlets, or other written materials from your doctor or pharmacy?: 1 - Never  Interpreter Needed?:  No  Information entered by :: Denman George LPN  Past Medical History:  Diagnosis Date  . Arthritis    "fingers, right hip" (11/07/2014)  . Complication of anesthesia    "trembling during my 1st cath"  . Coronary artery disease   . Easy bruising   . GERD (gastroesophageal reflux disease)   . Headache    "monthly" (11/07/2014)  . 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 History of stomach ulcers   . Hyperlipidemia   . Hypothyroidism   . Myocardial infarction ([place]) 03/2014  . PONV (postoperative nausea and vomiting)   . Stroke [facility])    questionably noted on a MRI  . Trouble swallowing   . Type II diabetes mellitus ([place])   . Vertigo    Past Surgical History:  Procedure Laterality Date  . ANTERIOR CERVICAL DECOMP/DISCECTOMY FUSION  2004  . CARDIAC CATHETERIZATION  2000  . CARDIAC CATHETERIZATION  04/15/2014   Procedure: CORONARY STENT INTERVENTION;  Surgeon: Laverda Page, MD;  Location: [facility] CATH LAB;  Service: Cardiovascular;;  RCA  . CARDIAC CATHETERIZATION N/A 11/07/2014   Procedure: Left Heart Cath and Coronary Angiography;  Surgeon: Adrian Prows, MD;  Location: [place] CV LAB;  Service: Cardiovascular;  Laterality: N/A;  . CLIPPING OF ATRIAL APPENDAGE N/A 06/09/2018   Procedure: CLIPPING OF ATRIAL APPENDAGE using  AtriCure Clip size 35;  Surgeon: Loreli Slot, MD;  Location: [facility] OR;  Service: Open Heart Surgery;  Laterality: N/A;  . CORONARY ANGIOGRAPHY N/A 06/05/2018   Procedure: CORONARY ANGIOGRAPHY;  Surgeon: Elder Negus, MD;  Location: [facility] INVASIVE CV LAB;  Service: Cardiovascular;  Laterality: N/A;  . CORONARY ANGIOPLASTY WITH STENT PLACEMENT  2000; 11/07/2014   "3 stents; 1 stent"  . CORONARY ARTERY BYPASS GRAFT N/A 06/09/2018   Procedure: CORONARY ARTERY BYPASS GRAFTING (CABG) x one using radial artery;  Surgeon: Loreli Slot, MD;  Location: [facility] OR;  Service: Open Heart Surgery;   Laterality: N/A;  . LAPAROSCOPIC CHOLECYSTECTOMY  1998  . LEFT HEART CATHETERIZATION WITH CORONARY ANGIOGRAM N/A 04/15/2014   Procedure: LEFT HEART CATHETERIZATION WITH CORONARY ANGIOGRAM;  Surgeon: Pamella Pert, MD;  Location: [facility] CATH LAB;  Service: Cardiovascular;  Laterality: N/A;  . RADIAL ARTERY HARVEST Left 06/09/2018   Procedure: RADIAL ARTERY HARVEST;  Surgeon: Loreli Slot, MD;  Location: [facility] OR;  Service: Open Heart Surgery;  Laterality: Left;  . TEE WITHOUT CARDIOVERSION N/A 06/09/2018   Procedure: TRANSESOPHAGEAL ECHOCARDIOGRAM (TEE);  Surgeon: Loreli Slot, MD;  Location: [facility] OR;  Service: Open Heart Surgery;  Laterality: N/A;   Family History  Problem Relation Age of Onset  . Diabetes Mother   . Hypothyroidism Mother   . Stroke Father   . Hypertension Father   . Colon cancer Neg Hx   . Stomach cancer Neg Hx   . Rectal cancer Neg Hx   . Esophageal cancer Neg Hx   . Liver cancer Neg Hx    Social History   Socioeconomic History  . Marital status: Married    Spouse name: Not on file  . Number of children: 3  . Years of education: Not on file  . Highest education level: Not on file  Occupational History  . Occupation: Retired   Tobacco Use  . Smoking status: Former Smoker    Packs/day: 1.50    Years: 34.00    Pack years: 51.00    Types: Cigarettes    Quit date: 07/27/2000    Years since quitting: 18.7  . Smokeless tobacco: Never Used  . Tobacco comment: "stopped smoking in ~ 2000"  Substance and Sexual Activity  . Alcohol use: No  . Drug use: No    Comment: 11/07/2014 "experimented in the 1960's and 1970's; nothing since"  . Sexual activity: Not Currently  Other Topics Concern  . Not on file  Social History Narrative   Was previously in the [employer]    Social Determinants of Health   Financial Resource Strain:   . Difficulty of Paying Living Expenses: Not on file  Food Insecurity: No Food Insecurity  . Worried About Programme researcher, broadcasting/film/video in  the Last Year: Never true  . Ran Out of Food in the Last Year: Never true  Transportation Needs: No Transportation Needs  . Lack of Transportation (Medical): No  . Lack of Transportation (Non-Medical): No  Physical Activity:   . Days of Exercise per Week: Not on file  . Minutes of Exercise per Session: Not on file  Stress:   . Feeling of Stress : Not on file  Social Connections:   . Frequency of Communication with Friends and Family: Not on file  . Frequency of Social Gatherings with Friends and Family: Not on file  . Attends Religious Services: Not on file  . Active Member of Clubs or Organizations: Not on file  .  Attends Banker Meetings: Not on file  . Marital Status: Not on file    Outpatient Encounter Medications as of 05/04/2019  Medication Sig  . acetaminophen (TYLENOL) 500 MG tablet Take 500 mg by mouth every 6 (six) hours as needed. For pain  . Alpha-Lipoic Acid 300 MG CAPS Take by mouth daily.    Marland Kitchen amiodarone (PACERONE) 200 MG tablet Take 0.5 tablets (100 mg total) by mouth daily.  Marland Kitchen aspirin EC 81 MG EC tablet Take 1 tablet (81 mg total) by mouth daily.  Marland Kitchen atorvastatin (LIPITOR) 40 MG tablet   . BESIVANCE 0.6 % SUSP   . co-enzyme Q-10 30 MG capsule Take 120 mg by mouth daily.    Marland Kitchen dimenhyDRINATE (DRAMAMINE) 50 MG tablet Take 50 mg by mouth every 8 (eight) hours as needed.  . DUREZOL 0.05 % EMUL   . ELIQUIS 5 MG TABS tablet TAKE 1 TABLET BY MOUTH TWO TIMES A DAY  . ezetimibe (ZETIA) 10 MG tablet TAKE ONE TABLET BY MOUTH DAILY  . glipiZIDE (GLUCOTROL XL) 10 MG 24 hr tablet Take 1 tablet (10 mg total) by mouth 2 (two) times daily.  Marland Kitchen glucose blood (ONE TOUCH ULTRA TEST) test strip Check blood sugar 2-3 times daily  . Insulin Glargine (LANTUS) 100 UNIT/ML Solostar Pen Inject 20 Units into the skin as directed. 15-35 units q a.m. (Patient taking differently: Inject 15-35 Units into the skin as directed. 15-35 units q a.m.)  . ketoconazole (NIZORAL) 2 % cream Apply 1  application topically 2 (two) times daily.  Marland Kitchen levothyroxine (SYNTHROID, LEVOTHROID) 125 MCG tablet Take 1 tablet (125 mcg total) by mouth daily before breakfast.  . losartan (COZAAR) 25 MG tablet Take 1 tablet (25 mg total) by mouth daily.  . meclizine (ANTIVERT) 12.5 MG tablet Take 1 tablet (12.5 mg total) by mouth 3 (three) times daily as needed for dizziness.  . metFORMIN (GLUCOPHAGE) 1000 MG tablet 2 (two) times daily.   . metoprolol tartrate (LOPRESSOR) 25 MG tablet Take 1 tablet (25 mg total) by mouth 2 (two) times daily.  . Multiple Vitamin (MULTIVITAMIN PO) Take 1 tablet by mouth daily.   . pantoprazole (PROTONIX) 40 MG tablet Take 1 tablet (40 mg total) by mouth daily.  Marland Kitchen POTASSIUM GLUCONATE PO Take 1 tablet by mouth daily.   Marland Kitchen PROLENSA 0.07 % SOLN Place 1 drop into the right eye at bedtime.  . rosuvastatin (CRESTOR) 40 MG tablet Take 1 tablet (40 mg total) by mouth daily.  Marland Kitchen triamcinolone cream (KENALOG) 0.1 % Apply 1 application topically 2 (two) times daily.  . vitamin B-12 (CYANOCOBALAMIN) 100 MCG tablet Take 100 mcg by mouth daily.   . Zinc Acetate, Oral, (ZINC ACETATE PO) Take 1 tablet by mouth daily.    No facility-administered encounter medications on file as of 05/04/2019.    Activities of Daily Living In your present state of health, do you have any difficulty performing the following activities: 05/04/2019 05/03/2019  Hearing? Y N  Comment - -  Vision? Y Y  Comment upcoming cataract surgery cataract to left eye  Difficulty concentrating or making decisions? N N  Walking or climbing stairs? N N  Dressing or bathing? N N  Doing errands, shopping? N N  Preparing Food and eating ? N N  Using the Toilet? N N  In the past six months, have you accidently leaked urine? N -  Do you have problems with loss of bowel control? N -  Managing your Medications?  N N  Managing your Finances? N N  Housekeeping or managing your Housekeeping? - N  Some recent data might be hidden     Patient Care Team: Ardith Dark, MD as PCP - General (Family Medicine) Wynonia Hazard, [facility] as [facility] Management (Pharmacist) Helane Gunther, DPM as Consulting Physician (Podiatry) Yates Decamp, MD as Consulting Physician (Cardiology) Donna Christen, OD as Consulting Physician (Optometry)   Assessment:   This is a routine wellness examination for [place].  Exercise Activities and Dietary recommendations Current Exercise Habits: The patient does not participate in regular exercise at present  Goals    . Client verbalizes knowledge of Heart Attack self management skills within the next 3-6 months.      Follow up with provider visits as scheduled. Mailed education: "lowering your risk of heart disease". Please review and call if you have any questions. Please take medications as prescribed.    . Client will not report change from baseline and no repeated symptoms of stroke with in the next 6 months     Follow up with your provider as scheduled. Take your medications as scheduled. Know the signs/symptoms of stroke.  Face Drooping  Arm Weakness  Speech difficulty  Time to call 911 Mailed education:"lowering risk of having another stroke". Please review and call if you have any questions.    . Client will report no worsening of symptoms of Atrial Fibrillation within the next 3-6 months.     Please follow up with your provider as scheduled. Please take medications as prescribed. Mailed education material "living with atrial fibrillation". Please review and call if you have any questions.    . Client will report no worsening of symptoms related to heart disease within the next 3-6 months.     Verbalized continued improvement. Continues to follow up with providers.    . Client will verbalize fall prevention strategies within the next 6 months.     Please review educational material: preventing falls. Please follow up with provider as scheduled and contact  your provider if condition worsens. Take medication as prescribed by provider. Please use assistive device as recommended by provider.    . Client will verbalize knowledge of self management of Hypertension as evidences by BP reading of 140/90 or less; or as defined by provider     Client reports blood pressure readings between 140/80-150/80. Follow up with your doctor as scheduled. Ask your doctor, "what is my target blood pressure range". Monitor your blood pressure periodically and take to your doctor appointments. Continue to take your medications as recommended by your provider. Review education material, "High blood pressure: what you can do". Please call if you have any questions.     Marland Kitchen HEMOGLOBIN A1C < 7.0     Last A1C done 05/01/2019 was 7.1  Ask your doctor, "what is my Target A1C goal?" Ask your doctor, "what is my target blood sugar range?"  Diabetes self management actions: Glucose monitoring per provider recommendations Eat Healthy Check feet daily Visit provider every 3-6 months as directed Hbg A1C level every 3-6 months. Eye Exam yearly    . Maintain timely refills of diabetic medication as prescribed within the year .     Denies any difficulty obtaining medications. You are taking greater than 10 medications. I have asked one of our pharmacist from [facility] network to contact you to complete a review of your medications with you. Please contact your assigned RN Case Manager if you have  any difficulty getting your medications.    Clydene Pugh Annual Eye (retinal)  Exam      Client reports eye exam January 2021. Discussed importance of annual eye exam. It is important for you to follow up with your provider annually. You may contact the [facility] if you need to find an ophthalmologist that is in the network.    . Obtain Annual Foot Exam     Client reports done year 2020-podiatry exam noted 01/29/2019 It Is important for you to follow up with your  provider for yearly physical and recommended labs/procedures.    . Obtain annual screen for micro albuminuria (urine) , nephropathy (kidney problems)     Client reports has been completed year 2020.  It is important to follow up with your provider for yearly physical and recommended labs.    . Visit Primary Care Provider or Endocrinologist at least 2 times per year      Primary care provider visit 05/04/2018 and 09/08/2018.  It is important to follow up with your primary care provider for yearly physical and recommended labs. Please discuss any feelings of concern or questions with providers.  Primary care visit scheduled 05/04/2019 Cardiologist visit scheduled 05/07/2019        Fall Risk Fall Risk  05/04/2019 05/04/2018  Falls in the past year? 0 0  Number falls in past yr: 0 0  Injury with Fall? 0 0  Risk for fall due to : Impaired balance/gait -  Follow up Falls evaluation completed;Falls prevention discussed;Education provided -   Is the patient's home free of loose throw rugs in walkways, pet beds, electrical cords, etc?   yes      Grab bars in the bathroom? yes      Handrails on the stairs?   yes      Adequate lighting?   yes  Timed Get Up and Go Performed: completed and within normal timeframe; no gait abnormalities noted  Patient does express that he has a harder time getting up from lower sitting chairs and has recently purchased a lift chair   Depression Screen PHQ 2/9 Scores 05/04/2019 05/04/2019 05/02/2019 09/11/2018  PHQ - 2 Score 2 2 1  0  PHQ- 9 Score 6 6 - -    Cognitive Function     6CIT Screen 05/04/2019  What Year? 0 points  What month? 0 points  What time? 0 points  Count back from 20 0 points  Months in reverse 0 points  Repeat phrase 2 points  Total Score 2    Immunization History  Administered Date(s) Administered  . Pneumococcal Conjugate-13 08/22/2014  . Pneumococcal Polysaccharide-23 03/29/2008, 09/12/2013  . Tdap 07/25/2007  . Zoster 03/30/2006     Qualifies for Shingles Vaccine? Discussed and patient will check with pharmacy for coverage.  Patient education handout provided    Screening Tests Health Maintenance  Topic Date Due  . Hepatitis C Screening  27-Mar-1946  . OPHTHALMOLOGY EXAM  05/04/2019 (Originally 06/30/1955)  . TETANUS/TDAP  05/05/2019 (Originally 07/24/2017)  . COLONOSCOPY  05/03/2020 (Originally 06/30/1996)  . HEMOGLOBIN A1C  10/29/2019  . FOOT EXAM  05/01/2020  . PNA vac Low Risk Adult  Completed   Cancer Screenings: Lung: Low Dose CT Chest recommended if Age 70-80 years, 30 pack-year currently smoking OR have quit w/in 15years. Patient does not qualify. Colorectal: recommended, information provided on Cologuard patient will consider and contact office      Plan:  I have personally reviewed and addressed the Medicare  Annual Wellness questionnaire and have noted the following in the patient's chart:  A. Medical and social history B. Use of alcohol, tobacco or illicit drugs  C. Current medications and supplements D. Functional ability and status E.  Nutritional status F.  Physical activity G. Advance directives H. List of other physicians I.  Hospitalizations, surgeries, and ER visits in previous 12 months J.  Vitals K. Screenings such as hearing and vision if needed, cognitive and depression L. Referrals, records requested, and appointments- none   In addition, I have reviewed and discussed with patient certain preventive protocols, quality metrics, and best practice recommendations. A written personalized care plan for preventive services as well as general preventive health recommendations were provided to patient.   Signed,  Kandis Fantasia, LPN  Nurse Health Advisor   Nurse Notes: no additional

## 2019-05-04 NOTE — Progress Notes (Signed)
   Patrick Duran is a 74 y.o. male who presents today for an office visit.  Assessment/Plan:  Chronic Problems Addressed Today: Debility Offered referral for physical therapy however patient declined for the time being.  Anxiety Worsening.  Will start Celexa 20 mg daily.  He will follow-up in a couple weeks.  Will refill diazepam as well today.  Major depressive disorder, single episode, mild (HCC) Discussed options.  We will start Celexa 20 mg daily.  He will follow-up with me in couple weeks via MyChart.  Discuss potential side effects.     Subjective:  HPI:  Patient has had worsening depression over the last several months.  Has noticed increased irritability and more anger as well.  Has had several clinical events happen in the last year including cardiac bypass surgery.  Is also recently frustrated about a cataract surgery that was unable to be scheduled due to administrative errors.  He has been sleeping for what he feels like is too much.  No reported SI or HI.  He is currently taking Valium very sparingly as needed for anxiety which works well.  He also has noticed he is having some increased difficulty getting up and moving around.  This is happened after his bypass surgery.       Objective:  Physical Exam: BP 130/64   Pulse 66   Temp (!) 97.2 F (36.2 C)   Ht 6\' 1"  (1.854 m)   Wt 237 lb (107.5 kg)   SpO2 99%   BMI 31.27 kg/m   Gen: No acute distress, resting comfortably Psych: Normal affect and thought content      Courteney Alderete M. , MD 05/04/2019 2:08 PM

## 2019-05-04 NOTE — Assessment & Plan Note (Signed)
Worsening.  Will start Celexa 20 mg daily.  He will follow-up in a couple weeks.  Will refill diazepam as well today.

## 2019-05-04 NOTE — Assessment & Plan Note (Signed)
Offered referral for physical therapy however patient declined for the time being.

## 2019-05-04 NOTE — Patient Instructions (Addendum)
[name] , Thank you for taking time to come for your Medicare Wellness Visit. I appreciate your ongoing commitment to your health goals. Please review the following plan we discussed and let me know if I can assist you in the future.   Screening recommendations/referrals: Colorectal Screening: recommended (patient will consider Cologuard)   Vision and Dental Exams: Recommended annual ophthalmology exams for early detection of glaucoma and other disorders of the eye Recommended annual dental exams for proper oral hygiene  Diabetic Exams: Diabetic Eye Exam: recommended yearly Diabetic Foot Exam: recommended yearly; followed by podiatry   Vaccinations: Influenza vaccine: Egg allergy Pneumococcal vaccine: up to date; last 08/22/14 Tdap vaccine: recommended every 10 years; Please call your insurance company to determine your out of pocket expense. You may receive this vaccine at your local pharmacy or Health Dept. Shingles vaccine: Please call your insurance company to determine your out of pocket expense for the Shingrix vaccine. You may receive this vaccine at your local pharmacy. (see attached handout)  Advanced directives: I have provided a copy for you to complete at home and have notarized. Once this is complete please bring a copy in to our office so we can scan it into your chart.  Goals: Recommend to drink at least 6-8 8oz glasses of water per day and consume a balanced diet rich in fresh fruits and vegetables.   Next appointment: Please schedule your Annual Wellness Visit with your Nurse Health Advisor in one year.  Preventive Care 73 Years and Older, Male Preventive care refers to lifestyle choices and visits with your health care provider that can promote health and wellness. What does preventive care include?  A yearly physical exam. This is also called an annual well check.  Dental exams once or twice a year.  Routine eye exams. Ask your health care provider how often  you should have your eyes checked.  Personal lifestyle choices, including:  Daily care of your teeth and gums.  Regular physical activity.  Eating a healthy diet.  Avoiding tobacco and drug use.  Limiting alcohol use.  Practicing safe sex.  Taking low doses of aspirin every day if recommended by your health care provider..  Taking vitamin and mineral supplements as recommended by your health care provider. What happens during an annual well check? The services and screenings done by your health care provider during your annual well check will depend on your age, overall health, lifestyle risk factors, and family history of disease. Counseling  Your health care provider may ask you questions about your:  Alcohol use.  Tobacco use.  Drug use.  Emotional well-being.  Home and relationship well-being.  Sexual activity.  Eating habits.  History of falls.  Memory and ability to understand (cognition).  Work and work Astronomer. Screening  You may have the following tests or measurements:  Height, weight, and BMI.  Blood pressure.  Lipid and cholesterol levels. These may be checked every 5 years, or more frequently if you are over 66 years old.  Skin check.  Lung cancer screening. You may have this screening every year starting at age 35 if you have a 30-pack-year history of smoking and currently smoke or have quit within the past 15 years.  Fecal occult blood test (FOBT) of the stool. You may have this test every year starting at age 66.  Flexible sigmoidoscopy or colonoscopy. You may have a sigmoidoscopy every 5 years or a colonoscopy every 10 years starting at age 6.  Prostate cancer screening.  Recommendations will vary depending on your family history and other risks.  Hepatitis C blood test.  Hepatitis B blood test.  Sexually transmitted disease (STD) testing.  Diabetes screening. This is done by checking your blood sugar (glucose) after you have  not eaten for a while (fasting). You may have this done every 1-3 years.  Abdominal aortic aneurysm (AAA) screening. You may need this if you are a current or former smoker.  Osteoporosis. You may be screened starting at age 56 if you are at high risk. Talk with your health care provider about your test results, treatment options, and if necessary, the need for more tests. Vaccines  Your health care provider may recommend certain vaccines, such as:  Influenza vaccine. This is recommended every year.  Tetanus, diphtheria, and acellular pertussis (Tdap, Td) vaccine. You may need a Td booster every 10 years.  Zoster vaccine. You may need this after age 73.  Pneumococcal 13-valent conjugate (PCV13) vaccine. One dose is recommended after age 76.  Pneumococcal polysaccharide (PPSV23) vaccine. One dose is recommended after age 57. Talk to your health care provider about which screenings and vaccines you need and how often you need them. This information is not intended to replace advice given to you by your health care provider. Make sure you discuss any questions you have with your health care provider. Document Released: 04/11/2015 Document Revised: 12/03/2015 Document Reviewed: 01/14/2015 Elsevier Interactive Patient Education  2017 [employer].  Fall Prevention in the Home Falls can cause injuries. They can happen to people of all ages. There are many things you can do to make your home safe and to help prevent falls. What can I do on the outside of my home?  Regularly fix the edges of walkways and driveways and fix any cracks.  Remove anything that might make you trip as you walk through a door, such as a raised step or threshold.  Trim any bushes or trees on the path to your home.  Use bright outdoor lighting.  Clear any walking paths of anything that might make someone trip, such as rocks or tools.  Regularly check to see if handrails are loose or broken. Make sure that both  sides of any steps have handrails.  Any raised decks and porches should have guardrails on the edges.  Have any leaves, snow, or ice cleared regularly.  Use sand or salt on walking paths during winter.  Clean up any spills in your garage right away. This includes oil or grease spills. What can I do in the bathroom?  Use night lights.  Install grab bars by the toilet and in the tub and shower. Do not use towel bars as grab bars.  Use non-skid mats or decals in the tub or shower.  If you need to sit down in the shower, use a plastic, non-slip stool.  Keep the floor dry. Clean up any water that spills on the floor as soon as it happens.  Remove soap buildup in the tub or shower regularly.  Attach bath mats securely with double-sided non-slip rug tape.  Do not have throw rugs and other things on the floor that can make you trip. What can I do in the bedroom?  Use night lights.  Make sure that you have a light by your bed that is easy to reach.  Do not use any sheets or blankets that are too big for your bed. They should not hang down onto the floor.  Have a firm chair  that has side arms. You can use this for support while you get dressed.  Do not have throw rugs and other things on the floor that can make you trip. What can I do in the kitchen?  Clean up any spills right away.  Avoid walking on wet floors.  Keep items that you use a lot in easy-to-reach places.  If you need to reach something above you, use a strong step stool that has a grab bar.  Keep electrical cords out of the way.  Do not use floor polish or wax that makes floors slippery. If you must use wax, use non-skid floor wax.  Do not have throw rugs and other things on the floor that can make you trip. What can I do with my stairs?  Do not leave any items on the stairs.  Make sure that there are handrails on both sides of the stairs and use them. Fix handrails that are broken or loose. Make sure that  handrails are as long as the stairways.  Check any carpeting to make sure that it is firmly attached to the stairs. Fix any carpet that is loose or worn.  Avoid having throw rugs at the top or bottom of the stairs. If you do have throw rugs, attach them to the floor with carpet tape.  Make sure that you have a light switch at the top of the stairs and the bottom of the stairs. If you do not have them, ask someone to add them for you. What else can I do to help prevent falls?  Wear shoes that:  Do not have high heels.  Have rubber bottoms.  Are comfortable and fit you well.  Are closed at the toe. Do not wear sandals.  If you use a stepladder:  Make sure that it is fully opened. Do not climb a closed stepladder.  Make sure that both sides of the stepladder are locked into place.  Ask someone to hold it for you, if possible.  Clearly mark and make sure that you can see:  Any grab bars or handrails.  First and last steps.  Where the edge of each step is.  Use tools that help you move around (mobility aids) if they are needed. These include:  Canes.  Walkers.  Scooters.  Crutches.  Turn on the lights when you go into a dark area. Replace any light bulbs as soon as they burn out.  Set up your furniture so you have a clear path. Avoid moving your furniture around.  If any of your floors are uneven, fix them.  If there are any pets around you, be aware of where they are.  Review your medicines with your doctor. Some medicines can make you feel dizzy. This can increase your chance of falling. Ask your doctor what other things that you can do to help prevent falls. This information is not intended to replace advice given to you by your health care provider. Make sure you discuss any questions you have with your health care provider. Document Released: 01/09/2009 Document Revised: 08/21/2015 Document Reviewed: 04/19/2014 Elsevier Interactive Patient Education  2017  [employer].

## 2019-05-07 ENCOUNTER — Other Ambulatory Visit: Payer: Self-pay

## 2019-05-07 ENCOUNTER — Encounter: Payer: Self-pay | Admitting: Cardiology

## 2019-05-07 ENCOUNTER — Ambulatory Visit (INDEPENDENT_AMBULATORY_CARE_PROVIDER_SITE_OTHER): Payer: HMO | Admitting: Cardiology

## 2019-05-07 VITALS — BP 155/83 | HR 65 | Temp 97.6°F | Ht 73.0 in | Wt 237.5 lb

## 2019-05-07 DIAGNOSIS — I25119 Atherosclerotic heart disease of native coronary artery with unspecified angina pectoris: Secondary | ICD-10-CM

## 2019-05-07 DIAGNOSIS — I10 Essential (primary) hypertension: Secondary | ICD-10-CM | POA: Diagnosis not present

## 2019-05-07 DIAGNOSIS — E782 Mixed hyperlipidemia: Secondary | ICD-10-CM

## 2019-05-07 DIAGNOSIS — I480 Paroxysmal atrial fibrillation: Secondary | ICD-10-CM | POA: Diagnosis not present

## 2019-05-07 MED ORDER — LOSARTAN POTASSIUM 50 MG PO TABS: 50.0000 mg | ORAL_TABLET | Freq: Every day | ORAL | 2 refills | Status: DC

## 2019-05-07 NOTE — Progress Notes (Signed)
Primary Physician/Referring:  Ardith Dark, MD  Patient ID: Patrick Duran, male    DOB: October 14, 1945, 74 y.o.   MRN: [id number]  Chief Complaint  Patient presents with  . Hypertension  . Follow-up    6wk   HPI:    Patrick Duran  is a 74 y.o. male  with CAD, leg edema, DM and PAF, presented on 11/06/2014 with acute coronary syndrome and paroxysmal atrial fibrillation with RVR, underwent angioplasty for instent restenosis of the mid to distal RCA placed on 04/15/2014. Due to recurrent restenosis in the right coronary artery, he underwent one-vessel CABG on 06/09/2018 with free right radial graft to RCA and modified maze procedure followed by left atrial appendage clipping for A. Fib.  He has history of hypertension, Benign positional vertigo, chronic leg edema and venous claudication, hyperlipidemia and diabetes mellitus with stage 3 CKD.  Recently seen for 9-month office visit, blood pressure was uncontrolled.  He was started on losartan and now presents for follow-up.  Blood pressure has been much better controlled with Losartan; however, he reports that his machine is not reliable. He is tolerating medications well. No complaints today. States that he will need cataract surgery in the near future.  Past Medical History:  Diagnosis Date  . Arthritis    "fingers, right hip" (11/07/2014)  . Complication of anesthesia    "trembling during my 1st cath"  . Coronary artery disease   . Easy bruising   . GERD (gastroesophageal reflux disease)   . Headache    "monthly" (11/07/2014)  . 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 History of stomach ulcers   . Hyperlipidemia   . Hypothyroidism   . Myocardial infarction (HCC) 03/2014  . PONV (postoperative nausea and vomiting)   . Stroke [facility])    questionably noted on a MRI  . Trouble swallowing   . Type II diabetes mellitus (HCC)   . Vertigo    Past Surgical History:   Procedure Laterality Date  . ANTERIOR CERVICAL DECOMP/DISCECTOMY FUSION  2004  . CARDIAC CATHETERIZATION  2000  . CARDIAC CATHETERIZATION  04/15/2014   Procedure: CORONARY STENT INTERVENTION;  Surgeon: Pamella Pert, MD;  Location: [facility] CATH LAB;  Service: Cardiovascular;;  RCA  . CARDIAC CATHETERIZATION N/A 11/07/2014   Procedure: Left Heart Cath and Coronary Angiography;  Surgeon: Yates Decamp, MD;  Location: [facility] INVASIVE CV LAB;  Service: Cardiovascular;  Laterality: N/A;  . CLIPPING OF ATRIAL APPENDAGE N/A 06/09/2018   Procedure: CLIPPING OF ATRIAL APPENDAGE using AtriCure Clip size 35;  Surgeon: Loreli Slot, MD;  Location: [facility] OR;  Service: Open Heart Surgery;  Laterality: N/A;  . CORONARY ANGIOGRAPHY N/A 06/05/2018   Procedure: CORONARY ANGIOGRAPHY;  Surgeon: Elder Negus, MD;  Location: [facility] INVASIVE CV LAB;  Service: Cardiovascular;  Laterality: N/A;  . CORONARY ANGIOPLASTY WITH STENT PLACEMENT  2000; 11/07/2014   "3 stents; 1 stent"  . CORONARY ARTERY BYPASS GRAFT N/A 06/09/2018   Procedure: CORONARY ARTERY BYPASS GRAFTING (CABG) x one using radial artery;  Surgeon: Loreli Slot, MD;  Location: [facility] OR;  Service: Open Heart Surgery;  Laterality: N/A;  . LAPAROSCOPIC CHOLECYSTECTOMY  1998  . LEFT HEART CATHETERIZATION WITH CORONARY ANGIOGRAM N/A 04/15/2014   Procedure: LEFT HEART CATHETERIZATION WITH CORONARY ANGIOGRAM;  Surgeon: Pamella Pert, MD;  Location: [facility] CATH LAB;  Service: Cardiovascular;  Laterality: N/A;  . RADIAL  ARTERY HARVEST Left 06/09/2018   Procedure: RADIAL ARTERY HARVEST;  Surgeon: Loreli Slot, MD;  Location: [facility] OR;  Service: Open Heart Surgery;  Laterality: Left;  . TEE WITHOUT CARDIOVERSION N/A 06/09/2018   Procedure: TRANSESOPHAGEAL ECHOCARDIOGRAM (TEE);  Surgeon: Loreli Slot, MD;  Location: [facility] OR;  Service: Open Heart Surgery;  Laterality: N/A;   Social History   Socioeconomic History  . Marital status: Married    Spouse  name: Not on file  . Number of children: 3  . Years of education: Not on file  . Highest education level: Not on file  Occupational History  . Occupation: Retired   Tobacco Use  . Smoking status: Former Smoker    Packs/day: 1.50    Years: 34.00    Pack years: 51.00    Types: Cigarettes    Quit date: 07/27/2000    Years since quitting: 18.7  . Smokeless tobacco: Never Used  . Tobacco comment: "stopped smoking in ~ 2000"  Substance and Sexual Activity  . Alcohol use: No  . Drug use: No    Comment: 11/07/2014 "experimented in the 1960's and 1970's; nothing since"  . Sexual activity: Not Currently  Other Topics Concern  . Not on file  Social History Narrative   Was previously in the [employer]    Social Determinants of Health   Financial Resource Strain:   . Difficulty of Paying Living Expenses: Not on file  Food Insecurity: No Food Insecurity  . Worried About Programme researcher, broadcasting/film/video in the Last Year: Never true  . Ran Out of Food in the Last Year: Never true  Transportation Needs: No Transportation Needs  . Lack of Transportation (Medical): No  . Lack of Transportation (Non-Medical): No  Physical Activity:   . Days of Exercise per Week: Not on file  . Minutes of Exercise per Session: Not on file  Stress:   . Feeling of Stress : Not on file  Social Connections:   . Frequency of Communication with Friends and Family: Not on file  . Frequency of Social Gatherings with Friends and Family: Not on file  . Attends Religious Services: Not on file  . Active Member of Clubs or Organizations: Not on file  . Attends Banker Meetings: Not on file  . Marital Status: Not on file  Intimate Partner Violence:   . Fear of Current or Ex-Partner: Not on file  . Emotionally Abused: Not on file  . Physically Abused: Not on file  . Sexually Abused: Not on file   ROS  Review of Systems  Constitution: Negative for chills, decreased appetite, malaise/fatigue and weight gain.  Eyes:  Negative for visual disturbance.  Cardiovascular: Positive for dyspnea on exertion (STABLE) and leg swelling (STABLE). Negative for claudication, palpitations and syncope.  Endocrine: Negative for cold intolerance.  Hematologic/Lymphatic: Does not bruise/bleed easily.  Musculoskeletal: Negative for joint swelling.  Gastrointestinal: Negative for abdominal pain, anorexia and change in bowel habit.  Neurological: Positive for dizziness (has chronic vertigo and stable) and headaches (with elevated BP occasionally). Negative for light-headedness and numbness.  Psychiatric/Behavioral: Negative for depression and substance abuse.  All other systems reviewed and are negative.  Objective  Blood pressure (!) 155/83, pulse 65, temperature 97.6 F (36.4 C), height 6\' 1"  (1.854 m), weight 237 lb 8 oz (107.7 kg), SpO2 100 %.  Vitals with BMI 05/07/2019 05/04/2019 05/04/2019  Height 6\' 1"  6\' 1"  6\' 1"   Weight 237 lbs 8 oz 237 lbs 237 lbs  BMI 31.34 31.27 31.27  Systolic 155 130 786  Diastolic 83 64 64  Pulse 65 - 66     Physical Exam  Constitutional: He appears well-developed and well-nourished. No distress.  HENT:  Head: Atraumatic.  Eyes: Conjunctivae are normal.  Neck: No JVD present. No thyromegaly present.  Cardiovascular: Normal rate, regular rhythm, normal heart sounds, intact distal pulses and normal pulses. Exam reveals no gallop.  No murmur heard. Trace ankle edema. Chronic venostasis changes left leg noted. Normal carotid pulse and peripheral pulse.  Pulmonary/Chest: Effort normal and breath sounds normal.  Sternotomy scar noted  Abdominal: Soft. Bowel sounds are normal.  Musculoskeletal:        General: Normal range of motion.     Cervical back: Neck supple.  Neurological: He is alert.  Skin: Skin is warm and dry.  Psychiatric: He has a normal mood and affect.   Laboratory examination:   Recent Labs    10/12/18 0822 03/06/19 0813 05/01/19 0810  NA 140 141 143  K 4.5 4.5 4.2   CL 101 103 104  CO2 21 24 22   GLUCOSE 135* 157* 134*  BUN 20 19 13   CREATININE 1.50* 1.34* 1.10  CALCIUM 9.2 9.5 9.2  GFRNONAA 46* 53* 67  GFRAA 53* 61 77   estimated creatinine clearance is 78.1 mL/min (by C-G formula based on SCr of 1.1 mg/dL).  CMP Latest Ref Rng & Units 05/01/2019 03/06/2019 10/12/2018  Glucose 65 - 99 mg/dL 767(M) 094(B) 096(G)  BUN 8 - 27 mg/dL 13 19 20   Creatinine 0.76 - 1.27 mg/dL 8.36 6.29(U) 7.65(Y)  Sodium 134 - 144 mmol/L 143 141 140  Potassium 3.5 - 5.2 mmol/L 4.2 4.5 4.5  Chloride 96 - 106 mmol/L 104 103 101  CO2 20 - 29 mmol/L 22 24 21   Calcium 8.6 - 10.2 mg/dL 9.2 9.5 9.2  Total Protein 6.0 - 8.5 g/dL 6.4 6.8 6.7  Total Bilirubin 0.0 - 1.2 mg/dL 0.7 0.4 0.5  Alkaline Phos 39 - 117 IU/L 139(H) 131(H) 119(H)  AST 0 - 40 IU/L 20 25 21   ALT 0 - 44 IU/L 25 29 19    CBC Latest Ref Rng & Units 05/01/2019 10/12/2018 06/12/2018  WBC 3.4 - 10.8 x10E3/uL 4.5 4.7 7.8  Hemoglobin 13.0 - 17.7 g/dL 65.0 35.4 6.5(K)  Hematocrit 37.5 - 51.0 % 38.9 37.9 27.9(L)  Platelets 150 - 450 x10E3/uL 207 233 165   Lipid Panel     Component Value Date/Time   CHOL 108 05/01/2019 0810   TRIG 133 05/01/2019 0810   HDL 28 (L) 05/01/2019 0810   CHOLHDL 4.9 06/06/2018 0515   VLDL 28 06/06/2018 0515   LDLCALC 56 05/01/2019 0810   LDLDIRECT 106.6 10/12/2007 0906   HEMOGLOBIN A1C Lab Results  Component Value Date   HGBA1C 7.1 (H) 05/01/2019   MPG 119.76 06/06/2018   TSH Recent Labs    06/05/18 1433 10/12/18 0822 05/01/19 0811  TSH 1.426 4.610* 4.210     Medications and allergies   Allergies  Allergen Reactions  . Codeine Nausea Only  . Codeine Sulfate     REACTION: nausea  . Eggs Or Egg-Derived Products   . Hydrocodone Nausea Only    nausea  . Oxycodone Nausea Only  . Peanut-Containing Drug Products     And all nuts  . Percocet [Oxycodone-Acetaminophen]     nausea  . Tramadol      Current Outpatient Medications  Medication Instructions  .  acetaminophen (TYLENOL) 500 mg, Every  6 hours PRN  . Alpha-Lipoic Acid 300 MG CAPS Daily  . amiodarone (PACERONE) 100 mg, Oral, Daily  . aspirin 81 mg, Oral, Daily  . atorvastatin (LIPITOR) 40 MG tablet No dose, route, or frequency recorded.  Marland Kitchen BESIVANCE 0.6 % SUSP No dose, route, or frequency recorded.  . citalopram (CELEXA) 20 mg, Oral, Daily  . co-enzyme Q-10 120 mg, Daily  . diazepam (VALIUM) 5 mg, Oral, Every 12 hours PRN  . dimenhyDRINATE (DRAMAMINE) 50 mg, Oral, Every 8 hours PRN  . DUREZOL 0.05 % EMUL No dose, route, or frequency recorded.  Marland Kitchen ELIQUIS 5 MG TABS tablet TAKE 1 TABLET BY MOUTH TWO TIMES A DAY  . ezetimibe (ZETIA) 10 MG tablet TAKE ONE TABLET BY MOUTH DAILY  . glipiZIDE (GLUCOTROL XL) 10 mg, Oral, 2 times daily  . glucose blood (ONE TOUCH ULTRA TEST) test strip Check blood sugar 2-3 times daily  . Insulin Glargine (LANTUS) 20 Units, Subcutaneous, As directed, 15-35 units q a.m.  Marland Kitchen ketoconazole (NIZORAL) 2 % cream 1 application, Topical, 2 times daily  . levothyroxine (SYNTHROID) 125 mcg, Oral, Daily before breakfast  . losartan (COZAAR) 25 mg, Oral, Daily  . meclizine (ANTIVERT) 12.5 mg, Oral, 3 times daily PRN  . metFORMIN (GLUCOPHAGE) 1000 MG tablet 2 times daily  . metoprolol tartrate (LOPRESSOR) 25 mg, Oral, 2 times daily  . Multiple Vitamin (MULTIVITAMIN PO) 1 tablet, Oral, Daily  . pantoprazole (PROTONIX) 40 mg, Oral, Daily  . POTASSIUM GLUCONATE PO 1 tablet, Oral, Daily  . PROLENSA 0.07 % SOLN 1 drop, Right Eye, Daily at bedtime  . rosuvastatin (CRESTOR) 40 mg, Oral, Daily  . triamcinolone cream (KENALOG) 0.1 % 1 application, Topical, 2 times daily  . vitamin B-12 (CYANOCOBALAMIN) 100 mcg, Oral, Daily  . Zinc Acetate, Oral, (ZINC ACETATE PO) 1 tablet, Oral, Daily   Radiology:  No results found.  Cardiac Studies:   Coronary Angiography 06/05/2018 LM: Normal. LAD: Prox 30%, mid 40% focal stenoses. LCx: Distal 40% focal stenosis. RCA: Local 90% ISR in  prox-mid, mid RCA. Distal RCA 70% edge restenosis. Diffuse 80% ISR in distal RCA  Echocardiogram 06/05/2018 : 1. The left ventricle has normal systolic function, with an ejection fraction of 55-60%. Inferior hypokinesis. The cavity size was normal. There is mild concentric left ventricular hypertrophy. Left ventricular diastolic Doppler parameters are consistent with pseudonormalization.  2. The right ventricle has normal systolic function. The cavity was normal. There is no increase in right ventricular wall thickness.  3. Left atrial size was moderately dilated.  4. Right atrial size was mildly dilated.  Assessment     ICD-10-CM   1. Primary hypertension  I10   2. Paroxysmal atrial fibrillation (HCC)  I48.0   3. Coronary artery disease involving native coronary artery of native heart with angina pectoris ([place])  I25.119   4. Mixed hyperlipidemia  E78.2     EKG 03/19/2019: Normal sinus rhythm at the rate of 76 bpm, left axis deviation, left anterior fascicular block.  Poor R-wave progression, probably normal variant.  Nonspecific T abnormality.  Borderline low voltage complexes. No significant change from EKG 09/05/2018   Recommendations:   No orders of the defined types were placed in this encounter.   Patient is doing well with addition of losartan and had significant improvement in blood pressure.  His blood pressure does continue to be slightly elevated.  Will increase his losartan to 50 mg daily.  I have reviewed and discussed his recent lab results, kidney function  has been stable.  Encouraged him to continue to monitor his blood pressure regularly at home and to consider an new blood pressure cuff.  No complaints today.  He has not had any known recurrence of atrial fibrillation.  Tolerating anticoagulation well.  No symptoms of angina.  Since being changed to Crestor, he has had significant improvement in cholesterol levels, LDL has significantly improved from 84-56.  We will continue  with present medications.  We will see him back in 6 to 8 weeks for follow-up on hypertension.   Toniann Fail, MSN, APRN, FNP-C [facility] [place] Office: [phone] Fax: [phone]

## 2019-05-19 ENCOUNTER — Other Ambulatory Visit: Payer: Self-pay | Admitting: Cardiology

## 2019-05-19 DIAGNOSIS — I480 Paroxysmal atrial fibrillation: Secondary | ICD-10-CM

## 2019-06-02 ENCOUNTER — Other Ambulatory Visit: Payer: Self-pay | Admitting: Family Medicine

## 2019-06-05 ENCOUNTER — Other Ambulatory Visit: Payer: Self-pay | Admitting: Family Medicine

## 2019-06-08 DIAGNOSIS — Z01818 Encounter for other preprocedural examination: Secondary | ICD-10-CM | POA: Diagnosis not present

## 2019-06-08 DIAGNOSIS — H25811 Combined forms of age-related cataract, right eye: Secondary | ICD-10-CM | POA: Diagnosis not present

## 2019-06-08 DIAGNOSIS — H25012 Cortical age-related cataract, left eye: Secondary | ICD-10-CM | POA: Diagnosis not present

## 2019-06-16 ENCOUNTER — Other Ambulatory Visit: Payer: Self-pay | Admitting: Family Medicine

## 2019-06-25 DIAGNOSIS — H25811 Combined forms of age-related cataract, right eye: Secondary | ICD-10-CM | POA: Diagnosis not present

## 2019-06-25 DIAGNOSIS — H2511 Age-related nuclear cataract, right eye: Secondary | ICD-10-CM | POA: Diagnosis not present

## 2019-07-04 ENCOUNTER — Ambulatory Visit: Payer: HMO | Admitting: Cardiology

## 2019-07-09 DIAGNOSIS — H2512 Age-related nuclear cataract, left eye: Secondary | ICD-10-CM | POA: Diagnosis not present

## 2019-07-09 DIAGNOSIS — H25812 Combined forms of age-related cataract, left eye: Secondary | ICD-10-CM | POA: Diagnosis not present

## 2019-07-16 ENCOUNTER — Other Ambulatory Visit: Payer: Self-pay | Admitting: Cardiology

## 2019-07-16 ENCOUNTER — Other Ambulatory Visit: Payer: Self-pay | Admitting: Family Medicine

## 2019-07-16 DIAGNOSIS — E782 Mixed hyperlipidemia: Secondary | ICD-10-CM

## 2019-07-30 ENCOUNTER — Other Ambulatory Visit: Payer: Self-pay

## 2019-07-30 MED ORDER — LOSARTAN POTASSIUM 50 MG PO TABS: 50.0000 mg | ORAL_TABLET | Freq: Every day | ORAL | 0 refills | Status: AC

## 2019-08-01 ENCOUNTER — Encounter: Payer: Self-pay | Admitting: Podiatry

## 2019-08-01 ENCOUNTER — Ambulatory Visit: Payer: HMO | Admitting: Podiatry

## 2019-08-01 ENCOUNTER — Other Ambulatory Visit: Payer: Self-pay

## 2019-08-01 VITALS — Temp 97.2°F

## 2019-08-01 DIAGNOSIS — E119 Type 2 diabetes mellitus without complications: Secondary | ICD-10-CM

## 2019-08-01 DIAGNOSIS — M79674 Pain in right toe(s): Secondary | ICD-10-CM

## 2019-08-01 DIAGNOSIS — N183 Chronic kidney disease, stage 3 unspecified: Secondary | ICD-10-CM

## 2019-08-01 DIAGNOSIS — Z794 Long term (current) use of insulin: Secondary | ICD-10-CM | POA: Diagnosis not present

## 2019-08-01 DIAGNOSIS — B351 Tinea unguium: Secondary | ICD-10-CM | POA: Diagnosis not present

## 2019-08-01 DIAGNOSIS — M79675 Pain in left toe(s): Secondary | ICD-10-CM

## 2019-08-01 NOTE — Progress Notes (Signed)
This patient returns to my office for at risk foot care.  This patient requires this care by a professional since this patient will be at risk due to having diabetes and CKD.  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  This patient is unable to cut nails himself since the patient cannot reach his nails.These nails are painful walking and wearing shoes.  This patient presents for at risk foot care today.  General Appearance  Alert, conversant and in no acute stress.  Vascular  Dorsalis pedis and posterior tibial  pulses are palpable  bilaterally.  Capillary return is within normal limits  bilaterally. Temperature is within normal limits  bilaterally.  Neurologic  Senn-Weinstein monofilament wire test within normal limits  bilaterally. Muscle power within normal limits bilaterally.  Nails Thick disfigured discolored nails with subungual debris  from hallux to fifth toes bilaterally. No evidence of bacterial infection or drainage bilaterally.  Orthopedic  No limitations of motion  feet .  No crepitus or effusions noted.  No bony pathology or digital deformities noted.  Skin  normotropic skin with no porokeratosis noted bilaterally.  No signs of infections or ulcers noted.     Onychomycosis  Pain in right toes  Pain in left toes  Consent was obtained for treatment procedures.   Mechanical debridement of nails 1-5  bilaterally performed with a nail nipper.  Filed with dremel without incident.    Return office visit  3 months                    Told patient to return for periodic foot care and evaluation due to potential at risk complications.   Helane Gunther DPM

## 2019-08-02 NOTE — Progress Notes (Deleted)
Primary Physician/Referring:  Ardith Dark, MD  Patient ID: Patrick Duran, male    DOB: September 22, 1945, 74 y.o.   MRN: [id number]  No chief complaint on file.  HPI:    [name]  is a 74 y.o. male  with CAD, leg edema, DM and PAF, presented on 11/06/2014 with acute coronary syndrome and paroxysmal atrial fibrillation with RVR, underwent angioplasty for instent restenosis of the mid to distal RCA placed on 04/15/2014. Due to recurrent restenosis in the right coronary artery, he underwent one-vessel CABG on 06/09/2018 with free right radial graft to RCA and modified maze procedure followed by left atrial appendage clipping for A. Fib.  He has history of hypertension, Benign positional vertigo, chronic leg edema and venous claudication, hyperlipidemia and diabetes mellitus with stage 3 CKD.  Recently seen for 80-month office visit, blood pressure was uncontrolled.  He was started on losartan and now presents for follow-up.  Blood pressure has been much better controlled with Losartan; however, he reports that his machine is not reliable. He is tolerating medications well. No complaints today. States that he will need cataract surgery in the near future.  Past Medical History:  Diagnosis Date  . Arthritis    "fingers, right hip" (11/07/2014)  . Complication of anesthesia    "trembling during my 1st cath"  . Coronary artery disease   . Easy bruising   . GERD (gastroesophageal reflux disease)   . Headache    "monthly" (11/07/2014)  . 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 History of stomach ulcers   . Hyperlipidemia   . Hypothyroidism   . Myocardial infarction (HCC) 03/2014  . PONV (postoperative nausea and vomiting)   . Stroke [facility])    questionably noted on a MRI  . Trouble swallowing   . Type II diabetes mellitus (HCC)   . Vertigo    Past Surgical History:  Procedure Laterality Date  . ANTERIOR CERVICAL  DECOMP/DISCECTOMY FUSION  2004  . CARDIAC CATHETERIZATION  2000  . CARDIAC CATHETERIZATION  04/15/2014   Procedure: CORONARY STENT INTERVENTION;  Surgeon: Pamella Pert, MD;  Location: [facility] CATH LAB;  Service: Cardiovascular;;  RCA  . CARDIAC CATHETERIZATION N/A 11/07/2014   Procedure: Left Heart Cath and Coronary Angiography;  Surgeon: Yates Decamp, MD;  Location: [facility] INVASIVE CV LAB;  Service: Cardiovascular;  Laterality: N/A;  . CLIPPING OF ATRIAL APPENDAGE N/A 06/09/2018   Procedure: CLIPPING OF ATRIAL APPENDAGE using AtriCure Clip size 35;  Surgeon: Loreli Slot, MD;  Location: [facility] OR;  Service: Open Heart Surgery;  Laterality: N/A;  . CORONARY ANGIOGRAPHY N/A 06/05/2018   Procedure: CORONARY ANGIOGRAPHY;  Surgeon: Elder Negus, MD;  Location: [facility] INVASIVE CV LAB;  Service: Cardiovascular;  Laterality: N/A;  . CORONARY ANGIOPLASTY WITH STENT PLACEMENT  2000; 11/07/2014   "3 stents; 1 stent"  . CORONARY ARTERY BYPASS GRAFT N/A 06/09/2018   Procedure: CORONARY ARTERY BYPASS GRAFTING (CABG) x one using radial artery;  Surgeon: Loreli Slot, MD;  Location: [facility] OR;  Service: Open Heart Surgery;  Laterality: N/A;  . LAPAROSCOPIC CHOLECYSTECTOMY  1998  . LEFT HEART CATHETERIZATION WITH CORONARY ANGIOGRAM N/A 04/15/2014   Procedure: LEFT HEART CATHETERIZATION WITH CORONARY ANGIOGRAM;  Surgeon: Pamella Pert, MD;  Location: [facility] CATH LAB;  Service: Cardiovascular;  Laterality: N/A;  . RADIAL ARTERY HARVEST Left 06/09/2018   Procedure: RADIAL ARTERY HARVEST;  Surgeon:  Melrose Nakayama, MD;  Location: [place];  Service: Open Heart Surgery;  Laterality: Left;  . TEE WITHOUT CARDIOVERSION N/A 06/09/2018   Procedure: TRANSESOPHAGEAL ECHOCARDIOGRAM (TEE);  Surgeon: Melrose Nakayama, MD;  Location: [place];  Service: Open Heart Surgery;  Laterality: N/A;   Social History   Socioeconomic History  . Marital status: Married    Spouse name: Not on file  . Number of children: 3  .  Years of education: Not on file  . Highest education level: Not on file  Occupational History  . Occupation: Retired   Tobacco Use  . Smoking status: Former Smoker    Packs/day: 1.50    Years: 34.00    Pack years: 51.00    Types: Cigarettes    Quit date: 07/27/2000    Years since quitting: 19.0  . Smokeless tobacco: Never Used  . Tobacco comment: "stopped smoking in ~ 2000"  Substance and Sexual Activity  . Alcohol use: No  . Drug use: No    Comment: 11/07/2014 "experimented in the 1960's and 1970's; nothing since"  . Sexual activity: Not Currently  Other Topics Concern  . Not on file  Social History Narrative   Was previously in the [employer]    Social Determinants of Health   Financial Resource Strain:   . Difficulty of Paying Living Expenses:   Food Insecurity: No Food Insecurity  . Worried About Charity fundraiser in the Last Year: Never true  . Ran Out of Food in the Last Year: Never true  Transportation Needs: No Transportation Needs  . Lack of Transportation (Medical): No  . Lack of Transportation (Non-Medical): No  Physical Activity:   . Days of Exercise per Week:   . Minutes of Exercise per Session:   Stress:   . Feeling of Stress :   Social Connections:   . Frequency of Communication with Friends and Family:   . Frequency of Social Gatherings with Friends and Family:   . Attends Religious Services:   . Active Member of Clubs or Organizations:   . Attends Archivist Meetings:   Marland Kitchen Marital Status:   Intimate Partner Violence:   . Fear of Current or Ex-Partner:   . Emotionally Abused:   Marland Kitchen Physically Abused:   . Sexually Abused:    ROS  Review of Systems  Constitution: Negative for chills, decreased appetite, malaise/fatigue and weight gain.  Eyes: Negative for visual disturbance.  Cardiovascular: Positive for dyspnea on exertion (STABLE) and leg swelling (STABLE). Negative for claudication, palpitations and syncope.  Endocrine: Negative for cold  intolerance.  Hematologic/Lymphatic: Does not bruise/bleed easily.  Musculoskeletal: Negative for joint swelling.  Gastrointestinal: Negative for abdominal pain, anorexia and change in bowel habit.  Neurological: Positive for dizziness (has chronic vertigo and stable) and headaches (with elevated BP occasionally). Negative for light-headedness and numbness.  Psychiatric/Behavioral: Negative for depression and substance abuse.  All other systems reviewed and are negative.  Objective  There were no vitals taken for this visit.  Vitals with BMI 05/07/2019 05/04/2019 05/04/2019  Height 6\' 1"  6\' 1"  6\' 1"   Weight 237 lbs 8 oz 237 lbs 237 lbs  BMI 31.34 92.42 68.34  Systolic 196 222 979  Diastolic 83 64 64  Pulse 65 - 66     Physical Exam  Constitutional: He appears well-developed and well-nourished. No distress.  HENT:  Head: Atraumatic.  Eyes: Conjunctivae are normal.  Neck: No JVD present. No thyromegaly present.  Cardiovascular: Normal rate, regular rhythm,  normal heart sounds, intact distal pulses and normal pulses. Exam reveals no gallop.  No murmur heard. Trace ankle edema. Chronic venostasis changes left leg noted. Normal carotid pulse and peripheral pulse.  Pulmonary/Chest: Effort normal and breath sounds normal.  Sternotomy scar noted  Abdominal: Soft. Bowel sounds are normal.  Musculoskeletal:        General: Normal range of motion.     Cervical back: Neck supple.  Neurological: He is alert.  Skin: Skin is warm and dry.  Psychiatric: He has a normal mood and affect.   Laboratory examination:   Recent Labs    10/12/18 0822 03/06/19 0813 05/01/19 0810  NA 140 141 143  K 4.5 4.5 4.2  CL 101 103 104  CO2 21 24 22   GLUCOSE 135* 157* 134*  BUN 20 19 13   CREATININE 1.50* 1.34* 1.10  CALCIUM 9.2 9.5 9.2  GFRNONAA 46* 53* 67  GFRAA 53* 61 77   CrCl cannot be calculated (Patient's most recent lab result is older than the maximum 21 days allowed.).  CMP Latest Ref Rng &  Units 05/01/2019 03/06/2019 10/12/2018  Glucose 65 - 99 mg/dL 14/10/2018) 10/14/2018) 782(N)  BUN 8 - 27 mg/dL 13 19 20   Creatinine 0.76 - 1.27 mg/dL 562(Z 308(M) )  Sodium 134 - 144 mmol/L 143 141 140  Potassium 3.5 - 5.2 mmol/L 4.2 4.5 4.5  Chloride 96 - 106 mmol/L 104 103 101  CO2 20 - 29 mmol/L 22 24 21   Calcium 8.6 - 10.2 mg/dL 9.2 9.5 9.2  Total Protein 6.0 - 8.5 g/dL 6.4 6.8 6.7  Total Bilirubin 0.0 - 1.2 mg/dL 0.7 0.4 0.5  Alkaline Phos 39 - 117 IU/L 139(H) 131(H) 119(H)  AST 0 - 40 IU/L 20 25 21   ALT 0 - 44 IU/L 25 29 19    CBC Latest Ref Rng & Units 05/01/2019 10/12/2018 06/12/2018  WBC 3.4 - 10.8 x10E3/uL 4.5 4.7 7.8  Hemoglobin 13.0 - 17.7 g/dL   )  Hematocrit 37.5 - 51.0 % 38.9 37.9 27.9(L)  Platelets 150 - 450 x10E3/uL 207 233 165   Lipid Panel     Component Value Date/Time   CHOL 108 05/01/2019 0810   TRIG 133 05/01/2019 0810   HDL 28 (L) 05/01/2019 0810   CHOLHDL 4.9 06/06/2018 0515   VLDL 28 06/06/2018 0515   LDLCALC 56 05/01/2019 0810   LDLDIRECT 106.6 10/12/2007 0906   HEMOGLOBIN A1C Lab Results  Component Value Date   HGBA1C 7.1 (H) 05/01/2019   MPG 119.76 06/06/2018   TSH Recent Labs    10/12/18 0822 05/01/19 0811  TSH 4.610* 4.210     Medications and allergies   Allergies  Allergen Reactions  . Codeine Nausea Only  . Codeine Sulfate     REACTION: nausea  . Eggs Or Egg-Derived Products   . Hydrocodone Nausea Only    nausea  . Other Nausea And Vomiting  . Oxycodone Nausea Only  . Peanut-Containing Drug Products     And all nuts  . Percocet [Oxycodone-Acetaminophen]     nausea  . Tramadol      Current Outpatient Medications  Medication Instructions  . acetaminophen (TYLENOL) 500 mg, Every 6 hours PRN  . Alpha-Lipoic Acid 300 MG CAPS Daily  . amiodarone (PACERONE) 200 MG tablet TAKE 1/2 TABLET BY MOUTH DAILY  . aspirin 81 mg, Oral, Daily  . atorvastatin (LIPITOR) 40 MG tablet No dose, route, or frequency recorded.  06/29/2019 BESIVANCE  0.6 % SUSP No dose, route,  or frequency recorded.  . citalopram (CELEXA) 20 mg, Oral, Daily  . co-enzyme Q-10 120 mg, Daily  . diazepam (VALIUM) 5 mg, Oral, Every 12 hours PRN  . dimenhyDRINATE (DRAMAMINE) 50 mg, Oral, Every 8 hours PRN  . DUREZOL 0.05 % EMUL No dose, route, or frequency recorded.  Marland Kitchen ELIQUIS 5 MG TABS tablet TAKE ONE TABLET BY MOUTH TWICE A DAY  . ezetimibe (ZETIA) 10 MG tablet TAKE ONE TABLET BY MOUTH DAILY  . glipiZIDE (GLUCOTROL XL) 10 MG 24 hr tablet TAKE ONE TABLET BY MOUTH TWO TIMES A DAY  . glucose blood (ONE TOUCH ULTRA TEST) test strip Check blood sugar 2-3 times daily  . ketoconazole (NIZORAL) 2 % cream 1 application, Topical, 2 times daily  . LANTUS SOLOSTAR 100 UNIT/ML Solostar Pen INJECT 20 UNITS INTO THE SKIN AS DIRECTED (15 TO 35 UNITS IN THE MORNING)  . levothyroxine (SYNTHROID) 125 mcg, Oral, Daily before breakfast  . losartan (COZAAR) 50 mg, Oral, Daily  . meclizine (ANTIVERT) 12.5 mg, Oral, 3 times daily PRN  . metFORMIN (GLUCOPHAGE) 500 mg, Oral, 2 times daily  . metoprolol tartrate (LOPRESSOR) 25 mg, Oral, 2 times daily  . Multiple Vitamin (MULTIVITAMIN PO) 1 tablet, Oral, Daily  . ofloxacin (OCUFLOX) 0.3 % ophthalmic solution No dose, route, or frequency recorded.  . pantoprazole (PROTONIX) 40 MG tablet TAKE ONE TABLET BY MOUTH DAILY  . POTASSIUM GLUCONATE PO 1 tablet, Oral, Daily  . prednisoLONE acetate (PRED FORTE) 1 % ophthalmic suspension No dose, route, or frequency recorded.  Marland Kitchen PROLENSA 0.07 % SOLN 1 drop, Right Eye, Daily at bedtime  . rosuvastatin (CRESTOR) 40 mg, Oral, Daily  . triamcinolone cream (KENALOG) 0.1 % 1 application, Topical, 2 times daily  . vitamin B-12 (CYANOCOBALAMIN) 100 mcg, Oral, Daily  . Zinc Acetate, Oral, (ZINC ACETATE PO) 1 tablet, Oral, Daily   Radiology:  No results found.  Cardiac Studies:   Coronary Angiography 06/05/2018 LM: Normal. LAD: Prox 30%, mid 40% focal stenoses. LCx: Distal 40% focal stenosis. RCA:  Local 90% ISR in prox-mid, mid RCA. Distal RCA 70% edge restenosis. Diffuse 80% ISR in distal RCA  Echocardiogram 06/05/2018 : 1. The left ventricle has normal systolic function, with an ejection fraction of 55-60%. Inferior hypokinesis. The cavity size was normal. There is mild concentric left ventricular hypertrophy. Left ventricular diastolic Doppler parameters are consistent with pseudonormalization.  2. The right ventricle has normal systolic function. The cavity was normal. There is no increase in right ventricular wall thickness.  3. Left atrial size was moderately dilated.  4. Right atrial size was mildly dilated.  Assessment   No diagnosis found.  EKG 03/19/2019: Normal sinus rhythm at the rate of 76 bpm, left axis deviation, left anterior fascicular block.  Poor R-wave progression, probably normal variant.  Nonspecific T abnormality.  Borderline low voltage complexes. No significant change from EKG 09/05/2018   Recommendations:   No orders of the defined types were placed in this encounter.   Patient is doing well with addition of losartan and had significant improvement in blood pressure.  His blood pressure does continue to be slightly elevated.  Will increase his losartan to 50 mg daily.  I have reviewed and discussed his recent lab results, kidney function has been stable.  Encouraged him to continue to monitor his blood pressure regularly at home and to consider an new blood pressure cuff.  No complaints today.  He has not had any known recurrence of atrial fibrillation.  Tolerating anticoagulation  well.  No symptoms of angina.  Since being changed to Crestor, he has had significant improvement in cholesterol levels, LDL has significantly improved from 84-56.  We will continue with present medications.  We will see him back in 6 to 8 weeks for follow-up on hypertension.   Colon Branch, MSN, APRN, FNP-C [facility] Office: [phone] Fax: [phone]

## 2019-08-03 ENCOUNTER — Ambulatory Visit: Payer: HMO | Admitting: Cardiology

## 2019-08-12 ENCOUNTER — Other Ambulatory Visit: Payer: Self-pay

## 2019-08-12 DIAGNOSIS — E782 Mixed hyperlipidemia: Secondary | ICD-10-CM

## 2019-08-13 ENCOUNTER — Other Ambulatory Visit: Payer: Self-pay

## 2019-08-13 DIAGNOSIS — E782 Mixed hyperlipidemia: Secondary | ICD-10-CM

## 2019-08-13 MED ORDER — APIXABAN 5 MG PO TABS: 5.0000 mg | ORAL_TABLET | Freq: Two times a day (BID) | ORAL | 1 refills | Status: AC

## 2019-08-13 MED ORDER — ROSUVASTATIN CALCIUM 40 MG PO TABS: 40.0000 mg | ORAL_TABLET | Freq: Every day | ORAL | 3 refills | Status: AC

## 2019-08-15 LAB — HM DIABETES EYE EXAM

## 2019-08-16 ENCOUNTER — Other Ambulatory Visit: Payer: Self-pay | Admitting: Family Medicine

## 2019-08-16 ENCOUNTER — Encounter: Payer: Self-pay | Admitting: Family Medicine

## 2019-08-20 ENCOUNTER — Ambulatory Visit: Payer: Self-pay

## 2019-08-20 ENCOUNTER — Ambulatory Visit: Payer: HMO

## 2019-08-23 ENCOUNTER — Telehealth: Payer: Self-pay | Admitting: Family Medicine

## 2019-08-23 NOTE — Telephone Encounter (Signed)
Pt wife called stating that his depression has gotten worse. Wife asked if there was any way Dr. Jimmey Ralph could change his medication or prescribe something stronger. Scheduled pt an appointment for Tuesday to speak with Dr. Jimmey Ralph. Please advise.

## 2019-08-23 NOTE — Telephone Encounter (Signed)
Please advise 

## 2019-08-24 NOTE — Telephone Encounter (Signed)
I spoke with pt's wife to give her the message below. She says that he is acting very mercurial. She says that he is all over the place. She also states that he may be having bipolar episodes as well, very different than usual. She says that they are moving, and he is upset about move. I reminded her if it got any worse to take him to the walk in behavioral clinic. Pt's wife agrees.

## 2019-08-24 NOTE — Telephone Encounter (Signed)
Lvm for pt's wife to call the office back.

## 2019-08-24 NOTE — Telephone Encounter (Signed)
We can discuss at our OV next week. Recommend going to walk in behavioral health clinic if needs urgent help over the weekend.  Katina Degree. Jimmey Ralph, MD 08/24/2019 3:12 PM

## 2019-08-28 ENCOUNTER — Telehealth (INDEPENDENT_AMBULATORY_CARE_PROVIDER_SITE_OTHER): Payer: HMO | Admitting: Family Medicine

## 2019-08-28 ENCOUNTER — Encounter: Payer: Self-pay | Admitting: Family Medicine

## 2019-08-28 VITALS — BP 140/80 | Ht 73.0 in | Wt 227.0 lb

## 2019-08-28 DIAGNOSIS — F419 Anxiety disorder, unspecified: Secondary | ICD-10-CM

## 2019-08-28 DIAGNOSIS — F320 Major depressive disorder, single episode, mild: Secondary | ICD-10-CM | POA: Diagnosis not present

## 2019-08-28 MED ORDER — FLUOXETINE HCL 10 MG PO CAPS
10.0000 mg | ORAL_CAPSULE | Freq: Every day | ORAL | 3 refills | Status: DC
Start: 2019-08-28 — End: 2019-12-19

## 2019-08-28 NOTE — Assessment & Plan Note (Addendum)
Worsened.  Start Prozac 10 mg daily.  Increase dose as tolerated.  Follow-up with me in a few weeks.  May consider trial of Zoloft in the future if does not respond to Prozac.  We will place referral to behavioral health as well.

## 2019-08-28 NOTE — Progress Notes (Signed)
   Patrick Duran is a 74 y.o. male who presents today for a virtual office visit.  Assessment/Plan:  Chronic Problems Addressed Today: Anxiety Worsening.  Did not tolerate Celexa.  Will start low-dose Prozac 10 mg daily.  Any Valium 5 mg 3 times daily as needed.  He will check in with me in a few weeks.  Increase dose of Prozac as tolerated.  If still no improvement would consider trial of Zoloft.  Major depressive disorder, single episode, mild (HCC) Worsened.  Start Prozac 10 mg daily.  Increase dose as tolerated.  Follow-up with me in a few weeks.  May consider trial of Zoloft in the future if does not respond to Prozac.  We will place referral to behavioral health as well.     Subjective:  HPI:  Patient here for depression and anxeity follow up. Was seen about 4 months ago.  Started on Celexa.  Took 1 time but did not tolerate due to side effects.  Symptoms worsen over the last month or so.  Wife wants to move to [place].  He has quite a bit of apprehension surrounding this.  He has seen therapy in the past which worked well.  Is never been on any other medications.       Objective/Observations  Physical Exam: Gen: NAD, resting comfortably Pulm: Normal work of breathing Neuro: Grossly normal, moves all extremities Psych: Normal affect and thought content  Virtual Visit via Video   I connected with Patrick Duran on 08/28/19 at  4:00 PM EDT by a video enabled telemedicine application and verified that I am speaking with the correct person using two identifiers. The limitations of evaluation and management by telemedicine and the availability of in person appointments were discussed. The patient expressed understanding and agreed to proceed.   Patient location: Home Provider location:  [facility] [employer] Persons participating in the virtual visit: Myself and Patient     Patrick Duran. Jimmey Ralph, MD 08/28/2019 4:10 PM

## 2019-08-28 NOTE — Assessment & Plan Note (Signed)
Worsening.  Did not tolerate Celexa.  Will start low-dose Prozac 10 mg daily.  Any Valium 5 mg 3 times daily as needed.  He will check in with me in a few weeks.  Increase dose of Prozac as tolerated.  If still no improvement would consider trial of Zoloft.

## 2019-08-31 NOTE — Progress Notes (Signed)
Primary Physician/Referring:  Ardith Dark, MD  Patient ID: Patrick Duran, male    DOB: [birth year], 74 y.o.   MRN: [id number]  Chief Complaint  Patient presents with  . Hypertension  . Coronary Artery Disease  . Paroxysmal Atrial Fibrillation  . Follow-up    6 week   HPI:    HISAO DOO  is a 74 y.o. male  with CAD, leg edema, DM and PAF and CAD. Due to recurrent restenosis in the right coronary artery, he underwent one-vessel CABG on 06/09/2018 with free right radial graft to RCA and modified maze procedure followed by left atrial appendage clipping for A. Fib.  He has history of hypertension, Benign positional vertigo, chronic leg edema and venous claudication, hyperlipidemia and diabetes mellitus with stage 3 CKD.  Presently on the present along with aspirin for CAD and paroxysmal atrial fibrillation without bleeding diathesis.  He has for depression all the plans of him moving to [place].  He is presently on Prozac for the same and seems to be helping.  He has noticed his blood pressure to be elevated.  No chest pain or dyspnea.  Past Medical History:  Diagnosis Date  . Arthritis    "fingers, right hip" (11/07/2014)  . Complication of anesthesia    "trembling during my 1st cath"  . Coronary artery disease   . Easy bruising   . GERD (gastroesophageal reflux disease)   . Headache    "monthly" (11/07/2014)  . 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 History of stomach ulcers   . Hyperlipidemia   . Hypothyroidism   . Myocardial infarction (HCC) 03/2014  . PONV (postoperative nausea and vomiting)   . Stroke [facility])    questionably noted on a MRI  . Trouble swallowing   . Type II diabetes mellitus (HCC)   . Vertigo    Past Surgical History:  Procedure Laterality Date  . ANTERIOR CERVICAL DECOMP/DISCECTOMY FUSION  2004  . CARDIAC CATHETERIZATION  2000  . CARDIAC CATHETERIZATION  04/15/2014    Procedure: CORONARY STENT INTERVENTION;  Surgeon: Pamella Pert, MD;  Location: [facility] CATH LAB;  Service: Cardiovascular;;  RCA  . CARDIAC CATHETERIZATION N/A 11/07/2014   Procedure: Left Heart Cath and Coronary Angiography;  Surgeon: Yates Decamp, MD;  Location: [facility] INVASIVE CV LAB;  Service: Cardiovascular;  Laterality: N/A;  . CLIPPING OF ATRIAL APPENDAGE N/A 06/09/2018   Procedure: CLIPPING OF ATRIAL APPENDAGE using AtriCure Clip size 35;  Surgeon: Loreli Slot, MD;  Location: [facility] OR;  Service: Open Heart Surgery;  Laterality: N/A;  . CORONARY ANGIOGRAPHY N/A 06/05/2018   Procedure: CORONARY ANGIOGRAPHY;  Surgeon: Elder Negus, MD;  Location: [facility] INVASIVE CV LAB;  Service: Cardiovascular;  Laterality: N/A;  . CORONARY ANGIOPLASTY WITH STENT PLACEMENT  2000; 11/07/2014   "3 stents; 1 stent"  . CORONARY ARTERY BYPASS GRAFT N/A 06/09/2018   Procedure: CORONARY ARTERY BYPASS GRAFTING (CABG) x one using radial artery;  Surgeon: Loreli Slot, MD;  Location: [facility] OR;  Service: Open Heart Surgery;  Laterality: N/A;  . LAPAROSCOPIC CHOLECYSTECTOMY  1998  . LEFT HEART CATHETERIZATION WITH CORONARY ANGIOGRAM N/A 04/15/2014   Procedure: LEFT HEART CATHETERIZATION WITH CORONARY ANGIOGRAM;  Surgeon: Pamella Pert, MD;  Location: [facility] CATH LAB;  Service: Cardiovascular;  Laterality: N/A;  . RADIAL ARTERY HARVEST Left 06/09/2018   Procedure: RADIAL ARTERY HARVEST;  Surgeon: Charlett Lango  C, MD;  Location: [facility] OR;  Service: Open Heart Surgery;  Laterality: Left;  . TEE WITHOUT CARDIOVERSION N/A 06/09/2018   Procedure: TRANSESOPHAGEAL ECHOCARDIOGRAM (TEE);  Surgeon: Loreli Slot, MD;  Location[facility] OR;  Service: Open Heart Surgery;  Laterality: N/A;   Family History  Problem Relation Age of Onset  . Diabetes Mother   . Hypothyroidism Mother   . Stroke Father   . Hypertension Father   . Colon cancer Neg Hx   . Stomach cancer Neg Hx   . Rectal cancer Neg Hx   . Esophageal cancer  Neg Hx   . Liver cancer Neg Hx    Social History   Tobacco Use  . Smoking status: Former Smoker    Packs/day: 1.50    Years: 34.00    Pack years: 51.00    Types: Cigarettes    Quit date: 07/27/2000    Years since quitting: 19.1  . Smokeless tobacco: Never Used  . Tobacco comment: "stopped smoking in ~ 2000"  Substance Use Topics  . Alcohol use: No   Marital Status: Married  ROS  Review of Systems  Cardiovascular: Positive for dyspnea on exertion (STABLE) and leg swelling (STABLE). Negative for syncope.  Gastrointestinal: Negative for melena.  Neurological: Positive for dizziness (has chronic vertigo and stable) and headaches (with elevated BP occasionally).   Objective  Blood pressure (!) 141/73, pulse (!) 59, resp. rate 16, height 6\' 1"  (1.854 m), weight 234 lb (106.1 kg), SpO2 97 %.  Vitals with BMI 09/03/2019 08/28/2019 05/07/2019  Height 6\' 1"  6\' 1"  6\' 1"   Weight 234 lbs 227 lbs 237 lbs 8 oz  BMI 30.88 29.96 31.34  Systolic 141 140 07/05/2019  Diastolic 73 80 83  Pulse 59 - 65     Physical Exam  Constitutional: He appears well-developed and well-nourished. No distress.  Cardiovascular: Normal rate, regular rhythm and intact distal pulses. Exam reveals no gallop.  No murmur heard. Trace ankle edema. Chronic venostasis changes left leg noted. Normal carotid pulse and peripheral pulse.   Pulmonary/Chest: Effort normal and breath sounds normal. No accessory muscle usage.  Sternotomy scar noted   Abdominal: Soft. Bowel sounds are normal.   Laboratory examination:   Recent Labs    10/12/18 0822 03/06/19 0813 05/01/19 0810  NA 140 141 143  K 4.5 4.5 4.2  CL 101 103 104  CO2 21 24 22   GLUCOSE 135* 157* 134*  BUN 20 19 13   CREATININE 1.50* 1.34* 1.10  CALCIUM 9.2 9.5 9.2  GFRNONAA 46* 53* 67  GFRAA 53* 61 77   CrCl cannot be calculated (Patient's most recent lab result is older than the maximum 21 days allowed.).  CMP Latest Ref Rng & Units 05/01/2019 03/06/2019 10/12/2018   Glucose 65 - 99 mg/dL 06/29/19) ) )  BUN 8 - 27 mg/dL 13 19 20   Creatinine 0.76 - 1.27 mg/dL 06/29/2019 14/10/2018) 10/14/2018)  Sodium 134 - 144 mmol/L 143 141 140  Potassium 3.5 - 5.2 mmol/L 4.2 4.5 4.5  Chloride 96 - 106 mmol/L 104 103 101  CO2 20 - 29 mmol/L 22 24 21   Calcium 8.6 - 10.2 mg/dL 9.2 9.5 9.2  Total Protein 6.0 - 8.5 g/dL 6.4 6.8 6.7  Total Bilirubin 0.0 - 1.2 mg/dL 0.7 0.4 0.5  Alkaline Phos 39 - 117 IU/L 139(H) 131(H) 119(H)  AST 0 - 40 IU/L 20 25 21   ALT 0 - 44 IU/L 25 29 19    CBC Latest Ref Rng & Units  05/01/2019 10/12/2018 06/12/2018  WBC 3.4 - 10.8 x10E3/uL 4.5 4.7 7.8  Hemoglobin 13.0 - 17.7 g/dL 13.2 13.0 9.1(L)  Hematocrit 37.5 - 51.0 % 38.9 37.9 27.9(L)  Platelets 150 - 450 x10E3/uL 207 233 165   Lipid Panel     Component Value Date/Time   CHOL 108 05/01/2019 0810   TRIG 133 05/01/2019 0810   HDL 28 (L) 05/01/2019 0810   CHOLHDL 4.9 06/06/2018 0515   VLDL 28 06/06/2018 0515   LDLCALC 56 05/01/2019 0810   LDLDIRECT 106.6 10/12/2007 0906   HEMOGLOBIN A1C Lab Results  Component Value Date   HGBA1C 7.1 (H) 05/01/2019   MPG 119.76 06/06/2018   TSH Recent Labs    10/12/18 0822 05/01/19 0811  TSH 4.610* 4.210    Medications and allergiesNo New    Allergies  Allergen Reactions  . Codeine Nausea Only  . Codeine Sulfate     REACTION: nausea  . Eggs Or Egg-Derived Products   . Hydrocodone Nausea Only    nausea  . Other Nausea And Vomiting  . Oxycodone Nausea Only  . Peanut-Containing Drug Products     And all nuts  . Percocet [Oxycodone-Acetaminophen]     nausea  . Tramadol      Current Outpatient Medications  Medication Instructions  . acetaminophen (TYLENOL) 500 mg, Every 6 hours PRN  . Alpha-Lipoic Acid 300 MG CAPS Daily  . amiodarone (PACERONE) 200 MG tablet TAKE 1/2 TABLET BY MOUTH DAILY  . amLODipine (NORVASC) 5 mg, Oral, Every evening  . apixaban (ELIQUIS) 5 mg, Oral, 2 times daily  . co-enzyme Q-10 120 mg, Daily  . diazepam  (VALIUM) 5 mg, Oral, Every 12 hours PRN  . ezetimibe (ZETIA) 10 MG tablet TAKE ONE TABLET BY MOUTH DAILY  . FLUoxetine (PROZAC) 10 mg, Oral, Daily  . glipiZIDE (GLUCOTROL XL) 10 MG 24 hr tablet TAKE ONE TABLET BY MOUTH TWO TIMES A DAY  . glucose blood (ONE TOUCH ULTRA TEST) test strip Check blood sugar 2-3 times daily  . ketoconazole (NIZORAL) 2 % cream 1 application, Topical, 2 times daily  . LANTUS SOLOSTAR 100 UNIT/ML Solostar Pen INJECT 20 UNITS INTO THE SKIN AS DIRECTED (15 TO 35 UNITS IN THE MORNING)  . levothyroxine (SYNTHROID) 125 mcg, Oral, Daily before breakfast  . losartan (COZAAR) 50 mg, Oral, Daily  . meclizine (ANTIVERT) 12.5 MG tablet TAKE ONE TABLET BY MOUTH THREE TIMES A DAY AS NEEDED FOR DIZZINESS  . metoprolol tartrate (LOPRESSOR) 25 mg, Oral, 2 times daily  . Multiple Vitamin (MULTIVITAMIN PO) 1 tablet, Oral, Daily  . pantoprazole (PROTONIX) 40 MG tablet TAKE ONE TABLET BY MOUTH DAILY  . POTASSIUM GLUCONATE PO 1 tablet, Oral, Daily  . rosuvastatin (CRESTOR) 40 mg, Oral, Daily  . triamcinolone cream (KENALOG) 0.1 % 1 application, Topical, 2 times daily  . vitamin B-12 (CYANOCOBALAMIN) 100 mcg, Oral, Daily  . Zinc Acetate, Oral, (ZINC ACETATE PO) 1 tablet, Oral, Daily    Medications Discontinued During This Encounter  Medication Reason  . metFORMIN (GLUCOPHAGE) 500 MG tablet Discontinued by provider  . ofloxacin (OCUFLOX) 0.3 % ophthalmic solution Completed Course  . PROLENSA 0.07 % SOLN Completed Course  . prednisoLONE acetate (PRED FORTE) 1 % ophthalmic suspension Completed Course  . citalopram (CELEXA) 20 MG tablet No longer needed (for PRN medications)  . atorvastatin (LIPITOR) 40 MG tablet Change in therapy  . BESIVANCE 0.6 % SUSP No longer needed (for PRN medications)  . DUREZOL 0.05 % EMUL No longer needed (  for PRN medications)  . dimenhyDRINATE (DRAMAMINE) 50 MG tablet No longer needed (for PRN medications)  . aspirin EC 81 MG EC tablet Discontinued by  provider    Radiology:  No results found.  Cardiac Studies:   Coronary Angiography 06/05/2018 LM: Normal. LAD: Prox 30%, mid 40% focal stenoses. LCx: Distal 40% focal stenosis. RCA: Local 90% ISR in prox-mid, mid RCA. Distal RCA 70% edge restenosis. Diffuse 80% ISR in distal RCA  Echocardiogram 06/05/2018 : 1. The left ventricle has normal systolic function, with an ejection fraction of 55-60%. Inferior hypokinesis. The cavity size was normal. There is mild concentric left ventricular hypertrophy. Left ventricular diastolic Doppler parameters are consistent with pseudonormalization. 2. The right ventricle has normal systolic function. The cavity was normal. There is no increase in right ventricular wall thickness. 3. Left atrial size was moderately dilated. 4. Right atrial size was mildly dilated.  EKG   EKG 09/03/2019: Normal sinus rhythm with rate of 59 bpm, left axis deviation, left anterior fascicular block.  Poor R progression, cannot exclude anteroseptal infarct old.  No evidence of ischemia, normal QT interval. ABNORMAL no significant change from 07/18/2018.   Assessment     ICD-10-CM   1. Paroxysmal atrial fibrillation (HCC). CHA2DS2-VASc Score is 4.  Yearly risk of stroke: 4% (A, HTN, DM, Vasc Dz).    I48.0 EKG 12-Lead  2. Coronary artery disease involving native coronary artery of native heart with angina pectoris (HCC)  I25.119 amLODipine (NORVASC) 5 MG tablet  3. Primary hypertension  I10 amLODipine (NORVASC) 5 MG tablet  4. Mixed hyperlipidemia  E78.2     Recommendations:   RICHERD GRIME  is a 74 y.o. male  with CAD, leg edema, DM and PAF and CAD. Due to recurrent restenosis in the right coronary artery, he underwent one-vessel CABG on 06/09/2018 with free right radial graft to RCA and modified maze procedure followed by left atrial appendage clipping for A. Fib.  He has history of hypertension, Benign positional vertigo, chronic leg edema and venous claudication,  hyperlipidemia and diabetes mellitus with stage 3 CKD.  He doing well with no clinical evidence of heart failure, he has not had any recurrence of angina pectoris, as coronary disease is stable and he has been greater than a year since his CABG, discontinue aspirin to reduce risk of bleeding complications from Eliquis.  She is maintaining sinus rhythm on low-dose of amiodarone.  Continue low-dose beta-blocker as well.  His blood pressure is elevated today, I have restarted him back on amlodipine 5 mg in the evening.  He has an appointment to see Dr. Jimmey Ralph in about a month for follow-up of hypertension and also depression, if blood pressure is controlled he can prescribe a 90-day supplies.  I reviewed his external labs, renal function has stabilized and lipids are well controlled  Patient related to [place], and has been distressed about the move appears to be comprehending this well now.   Yates Decamp, MD, [facility] 09/03/2019, 10:46 AM [facility] Pager: [phone] Office: [phone] M: [phone]

## 2019-09-03 ENCOUNTER — Encounter: Payer: Self-pay | Admitting: Cardiology

## 2019-09-03 ENCOUNTER — Other Ambulatory Visit: Payer: Self-pay

## 2019-09-03 ENCOUNTER — Ambulatory Visit: Payer: HMO | Admitting: Cardiology

## 2019-09-03 VITALS — BP 141/73 | HR 59 | Resp 16 | Ht 73.0 in | Wt 234.0 lb

## 2019-09-03 DIAGNOSIS — I10 Essential (primary) hypertension: Secondary | ICD-10-CM | POA: Diagnosis not present

## 2019-09-03 DIAGNOSIS — I480 Paroxysmal atrial fibrillation: Secondary | ICD-10-CM

## 2019-09-03 DIAGNOSIS — E782 Mixed hyperlipidemia: Secondary | ICD-10-CM

## 2019-09-03 DIAGNOSIS — I25119 Atherosclerotic heart disease of native coronary artery with unspecified angina pectoris: Secondary | ICD-10-CM | POA: Diagnosis not present

## 2019-09-03 MED ORDER — AMLODIPINE BESYLATE 5 MG PO TABS: 5.0000 mg | ORAL_TABLET | Freq: Every evening | ORAL | 2 refills | Status: AC

## 2019-09-06 ENCOUNTER — Other Ambulatory Visit: Payer: Self-pay

## 2019-09-06 ENCOUNTER — Telehealth: Payer: Self-pay | Admitting: Family Medicine

## 2019-09-06 MED ORDER — LEVOTHYROXINE SODIUM 125 MCG PO TABS: 125.0000 ug | ORAL_TABLET | Freq: Every day | ORAL | 1 refills | Status: AC

## 2019-09-06 NOTE — Telephone Encounter (Signed)
MEDICATION: Levothyroxine 125 mcg  PHARMACY: [facility] [address]  Comments:   **Let patient know to contact pharmacy at the end of the day to make sure medication is ready. **  ** Please notify patient to allow 48-72 hours to process**  **Encourage patient to contact the pharmacy for refills or they can request refills through [facility]**

## 2019-09-06 NOTE — Telephone Encounter (Signed)
Rx sent 

## 2019-10-04 ENCOUNTER — Other Ambulatory Visit: Payer: Self-pay | Admitting: Cardiology

## 2019-10-04 DIAGNOSIS — E782 Mixed hyperlipidemia: Secondary | ICD-10-CM

## 2019-10-06 ENCOUNTER — Other Ambulatory Visit: Payer: Self-pay | Admitting: Family Medicine

## 2019-10-17 ENCOUNTER — Ambulatory Visit: Payer: Self-pay

## 2019-10-18 ENCOUNTER — Other Ambulatory Visit: Payer: Self-pay

## 2019-10-18 NOTE — Patient Outreach (Signed)
[facility]) Care Management Chronic Special Needs Program  10/18/2019  Name: Patrick Duran DOB: 09-04-1945  MRN: [id number]  Patrick Duran is enrolled in a chronic special needs plan for Diabetes. Client request no calls. RNCM Reviewed and updated care plan based on available data.  Goals Addressed            This Visit's Progress   . Client verbalizes knowledge of Heart Attack self management skills within the next 3-6 months.    On track    Follow up with provider visits as scheduled. Last cardiology visit 09/03/2019 Continue taking medications as prescribed, following up with provider visits as scheduled, low salt diet, activity per recommendation of providers.    . Client will not report change from baseline and no repeated symptoms of stroke with in the next 6 months   On track    Continue to follow up with your provider as scheduled. Continue to take your medications as prescribed.      . COMPLETED: Client will report no worsening of symptoms of Atrial Fibrillation within the next 3-6 months.       Cardiology visit 09/03/2019-no worsening symptoms noted.    . Client will report no worsening of symptoms related to heart disease within the next 3-6 months.   On track    Last cardiology visit 09/03/2019. Continue to attend provider visit as scheduled and take medications as prescribed.  Contact provider with any questions or concerns.    . Client will verbalize fall prevention strategies within the next 6 months.   On track    Unable to discuss. Continue fall prevention strategies: remove tripping hazards; wear non-slip shoes, maintain muscle tone/strength Continue to follow up with provider as scheduled. Continue to take medications as prescribed by provider. Please use assistive device as recommended by provider. Please contact your provider with with questions or concerns.    . Client will verbalize knowledge of self management of Hypertension as  evidences by BP reading of 140/90 or less; or as defined by provider   On track    Blood pressure 141/73 on 09/03/19 and 155/83 on 05/07/19. Continue to attend provider visits as scheduled. Ask your doctor, "what is my target blood pressure range". Monitor your blood pressure periodically and take to your doctor appointments. Continue to take your medications as recommended by your provider. Call your doctor with any questions or concerns. Mailed education, "Dash diet". Please review and call if you have any questions.      . COMPLETED: Maintain timely refills of diabetic medication as prescribed within the year .       Please contact your assigned RN Case Manager if you have any difficulty getting your medications. Per review-no difficulty obtaining diabetic medications.    . COMPLETED: Obtain Annual Eye (retinal)  Exam        Completed 08/16/2019    . COMPLETED: Obtain Annual Foot Exam       Done 08/01/2019    . Obtain annual screen for micro albuminuria (urine) , nephropathy (kidney problems)   On track    Not noted. Diabetes can affect your kidneys.  These test show how your kidneys are working. Continue to follow up with your provider for recommended labs.    . COMPLETED: Visit Primary Care Provider or Endocrinologist at least 2 times per year        Completed-Primary care provider visit completed 05/04/2019 and 08/28/2019.  It is important to follow up with your  primary care provider for yearly physical and recommended labs and prescription refills.          Plan: send updated care plan to client; send updated care plan to primary care provider. Send education. RNCM will continue to follow as client's C-SNP chronic care management coordinator. RNCM will remain available as needed per client request.   Kathyrn Sheriff, RN, MSN, [facility] Chronic Care Management Coordinator [facility] [phone]

## 2019-10-29 ENCOUNTER — Ambulatory Visit: Payer: HMO

## 2019-11-02 ENCOUNTER — Ambulatory Visit: Payer: HMO | Admitting: Podiatry

## 2019-11-02 ENCOUNTER — Telehealth: Payer: Self-pay | Admitting: Family Medicine

## 2019-11-02 NOTE — Progress Notes (Signed)
  Chronic Care Management   Outreach Note  11/02/2019 Name: Patrick Duran MRN: [id number] DOB: 05/08/1945  Referred by: Ardith Dark, MD Reason for referral : No chief complaint on file.   An unsuccessful telephone outreach was attempted today. The patient was referred to the pharmacist for assistance with care management and care coordination.   Follow Up Plan:   Lynnae January Upstream Scheduler

## 2019-11-16 ENCOUNTER — Telehealth: Payer: Self-pay | Admitting: Family Medicine

## 2019-11-16 NOTE — Progress Notes (Signed)
°  Chronic Care Management   Outreach Note  11/16/2019 Name: Patrick Duran MRN: [id number] DOB: 1945-12-04  Referred by: Ardith Dark, MD Reason for referral : No chief complaint on file.   An unsuccessful telephone outreach was attempted today. The patient was referred to the pharmacist for assistance with care management and care coordination.   Follow Up Plan:   Lynnae January Upstream Scheduler

## 2019-11-27 ENCOUNTER — Telehealth: Payer: Self-pay | Admitting: Family Medicine

## 2019-11-27 NOTE — Progress Notes (Signed)
°  Chronic Care Management   Outreach Note  11/27/2019 Name: Patrick Duran MRN: [id number] DOB: Jun 30, 1945  Referred by: Ardith Dark, MD Reason for referral : No chief complaint on file.   An unsuccessful telephone outreach was attempted today. The patient was referred to the pharmacist for assistance with care management and care coordination.   Follow Up Plan:   Lynnae January Upstream Scheduler

## 2019-12-05 ENCOUNTER — Other Ambulatory Visit: Payer: Self-pay

## 2019-12-05 NOTE — Patient Outreach (Signed)
  [facility]) Care Management Chronic Special Needs Program    12/05/2019  Name: PASHA BROAD, DOB: 1945-05-18  MRN: [id number]   RNCM received notification from Care management assistant that client has dis-enrolled from the CSNP Plan.   Plan: close case and send case closure letter to primary care provider.  Kathyrn Sheriff, RN, MSN, [facility] Chronic Care Management Coordinator [facility] [phone]

## 2019-12-19 ENCOUNTER — Telehealth: Payer: Self-pay

## 2019-12-19 MED ORDER — FLUOXETINE HCL 10 MG PO CAPS: 10.0000 mg | ORAL_CAPSULE | Freq: Every day | ORAL | 3 refills | Status: AC

## 2019-12-19 NOTE — Telephone Encounter (Signed)
Medication refilled

## 2019-12-19 NOTE — Telephone Encounter (Signed)
..   LAST APPOINTMENT DATE: 11/27/2019   NEXT APPOINTMENT DATE:@Visit  date not found  MEDICATION:FLUoxetine (PROZAC) 10 MG capsule    PHARMACY: [facility] [address] [place], [place]

## 2019-12-21 ENCOUNTER — Encounter: Payer: Self-pay | Admitting: Family Medicine

## 2019-12-21 NOTE — Telephone Encounter (Signed)
From pt

## 2020-03-06 ENCOUNTER — Ambulatory Visit: Payer: HMO | Admitting: Cardiology

## 2020-04-23 IMAGING — DX DG CHEST 1V PORT
2 series · 2 of 2 positions shown · non-contrast
Comparison: 11/05/2014 chest radiograph.

CLINICAL DATA: Chest pain

EXAM:
PORTABLE CHEST 1 VIEW

[chest ap (1 of 2)]
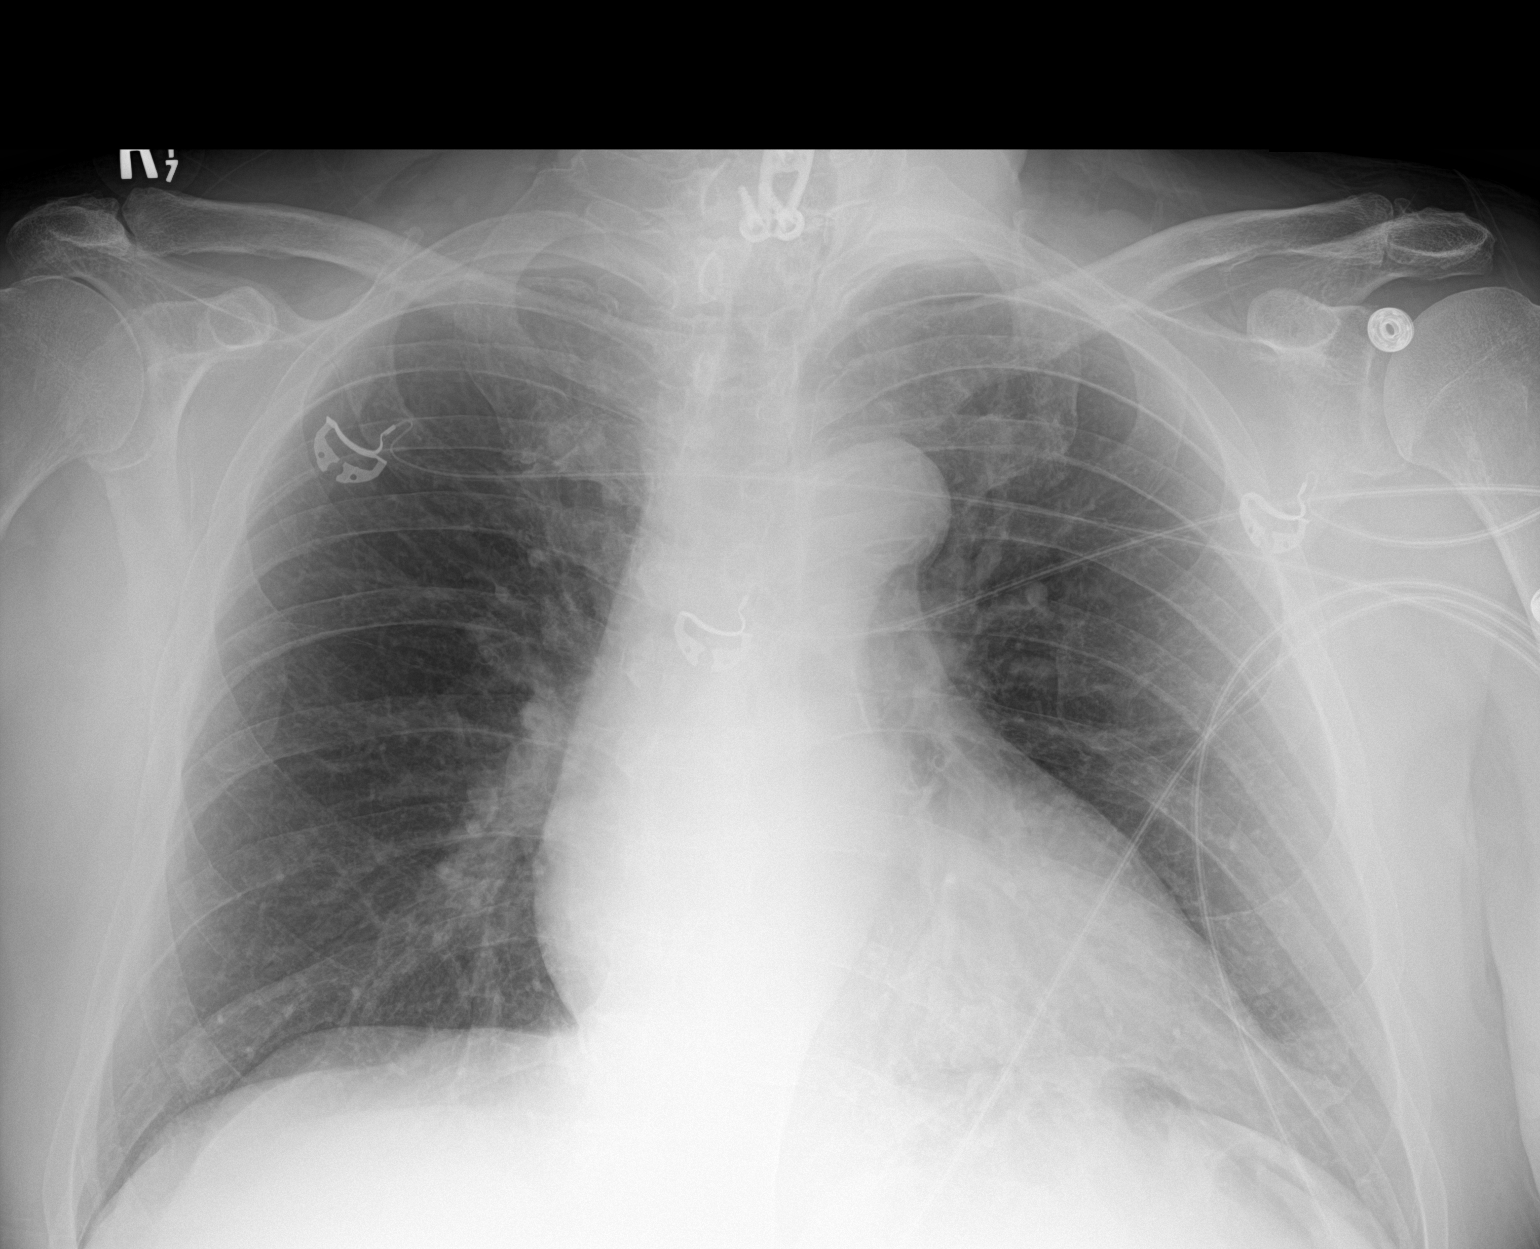

[chest ap (2 of 2)]
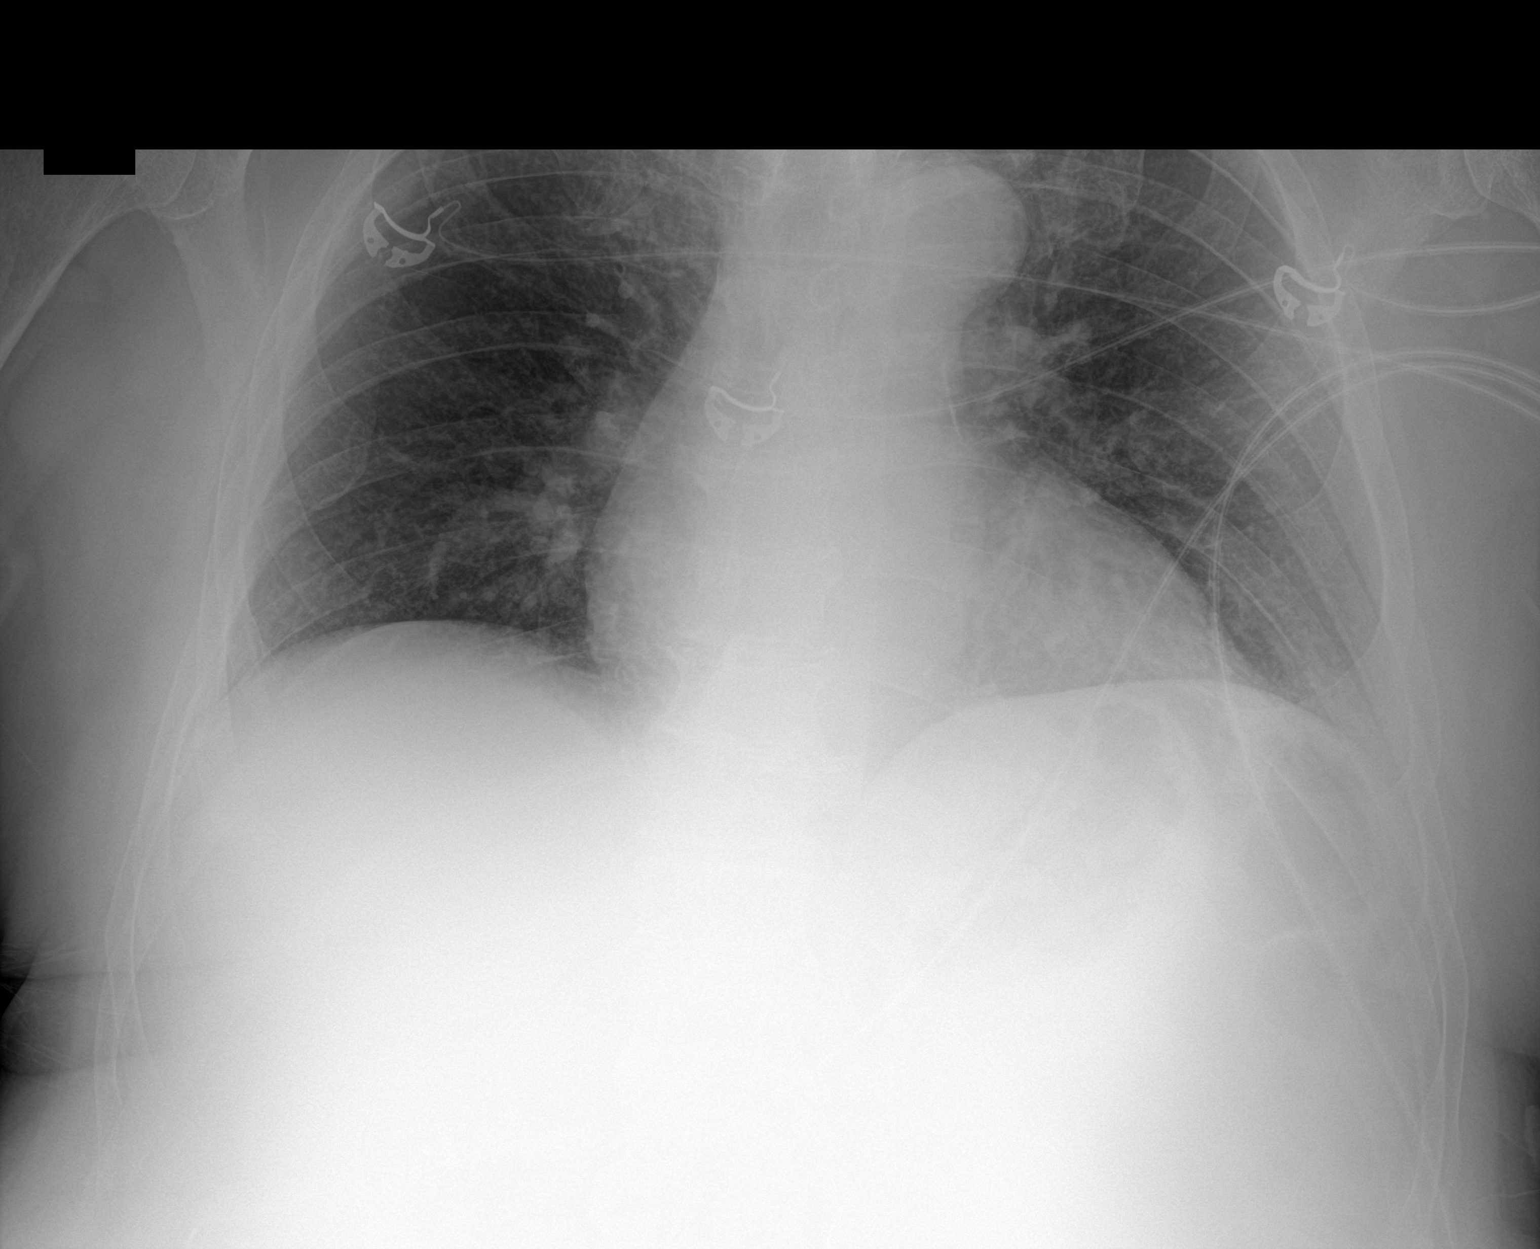

[2 of 2 positions shown; findings below may reference images not displayed]

FINDINGS: Surgical hardware from ACDF overlies the lower cervical spine.
Stable cardiomediastinal silhouette with mild cardiomegaly. No
pneumothorax. No pleural effusion. Lungs appear clear, with no acute
consolidative airspace disease and no pulmonary edema.
IMPRESSION: Stable mild cardiomegaly without pulmonary edema. No active
pulmonary disease.
